# Patient Record
Sex: Female | Born: 1970 | Race: Black or African American | Hispanic: No | Marital: Single | Smoking: Never smoker
Health system: Southern US, Community
[De-identification: ages and names within clinical notes are randomized; demographics above are authoritative.]

## PROBLEM LIST (undated history)

## (undated) DIAGNOSIS — R062 Wheezing: Secondary | ICD-10-CM

## (undated) DIAGNOSIS — E119 Type 2 diabetes mellitus without complications: Secondary | ICD-10-CM

## (undated) DIAGNOSIS — I499 Cardiac arrhythmia, unspecified: Secondary | ICD-10-CM

## (undated) DIAGNOSIS — I10 Essential (primary) hypertension: Secondary | ICD-10-CM

## (undated) DIAGNOSIS — R609 Edema, unspecified: Secondary | ICD-10-CM

## (undated) DIAGNOSIS — I272 Pulmonary hypertension, unspecified: Secondary | ICD-10-CM

## (undated) DIAGNOSIS — Z9109 Other allergy status, other than to drugs and biological substances: Secondary | ICD-10-CM

## (undated) DIAGNOSIS — G4730 Sleep apnea, unspecified: Secondary | ICD-10-CM

## (undated) DIAGNOSIS — Q909 Down syndrome, unspecified: Secondary | ICD-10-CM

## (undated) DIAGNOSIS — J189 Pneumonia, unspecified organism: Secondary | ICD-10-CM

## (undated) DIAGNOSIS — N049 Nephrotic syndrome with unspecified morphologic changes: Secondary | ICD-10-CM

## (undated) DIAGNOSIS — J45909 Unspecified asthma, uncomplicated: Secondary | ICD-10-CM

## (undated) DIAGNOSIS — N289 Disorder of kidney and ureter, unspecified: Secondary | ICD-10-CM

## (undated) HISTORY — PX: INGUINAL HERNIA REPAIR: SUR1180

## (undated) HISTORY — DX: Other allergy status, other than to drugs and biological substances: Z91.09

## (undated) HISTORY — PX: EYE SURGERY: SHX253

## (undated) HISTORY — DX: Nephrotic syndrome with unspecified morphologic changes: N04.9

## (undated) HISTORY — DX: Down syndrome, unspecified: Q90.9

## (undated) HISTORY — DX: Unspecified asthma, uncomplicated: J45.909

## (undated) HISTORY — DX: Sleep apnea, unspecified: G47.30

## (undated) HISTORY — PX: TONSILLECTOMY: SUR1361

## (undated) SURGERY — DIALYSIS/PERMA CATHETER REPAIR

---

## 2008-08-27 ENCOUNTER — Ambulatory Visit: Payer: Self-pay | Admitting: Urology

## 2011-11-02 ENCOUNTER — Ambulatory Visit: Payer: Self-pay | Admitting: Internal Medicine

## 2011-11-03 ENCOUNTER — Ambulatory Visit: Payer: Self-pay | Admitting: Internal Medicine

## 2012-03-10 ENCOUNTER — Emergency Department: Payer: Self-pay | Admitting: Emergency Medicine

## 2012-03-10 LAB — CBC
MCH: 28.4 pg (ref 26.0–34.0)
MCHC: 32.2 g/dL (ref 32.0–36.0)
RBC: 3.93 10*6/uL (ref 3.80–5.20)
RDW: 14.6 % — ABNORMAL HIGH (ref 11.5–14.5)

## 2012-03-10 LAB — COMPREHENSIVE METABOLIC PANEL
Albumin: 3.1 g/dL — ABNORMAL LOW (ref 3.4–5.0)
Alkaline Phosphatase: 74 U/L (ref 50–136)
Anion Gap: 8 (ref 7–16)
BUN: 25 mg/dL — ABNORMAL HIGH (ref 7–18)
Bilirubin,Total: 0.4 mg/dL (ref 0.2–1.0)
Calcium, Total: 9 mg/dL (ref 8.5–10.1)
Chloride: 103 mmol/L (ref 98–107)
EGFR (Non-African Amer.): 34 — ABNORMAL LOW
SGOT(AST): 13 U/L — ABNORMAL LOW (ref 15–37)
Sodium: 139 mmol/L (ref 136–145)
Total Protein: 7.9 g/dL (ref 6.4–8.2)

## 2012-05-31 ENCOUNTER — Ambulatory Visit: Payer: Self-pay | Admitting: Internal Medicine

## 2012-10-27 ENCOUNTER — Ambulatory Visit: Payer: Self-pay | Admitting: Physician Assistant

## 2015-07-23 ENCOUNTER — Ambulatory Visit
Admission: RE | Admit: 2015-07-23 | Discharge: 2015-07-23 | Disposition: A | Discharge status: Home / Self Care | Payer: Medicare Other | Source: Ambulatory Visit | Attending: Nurse Practitioner | Admitting: Nurse Practitioner

## 2015-07-23 ENCOUNTER — Other Ambulatory Visit: Payer: Self-pay | Admitting: Nurse Practitioner

## 2015-07-23 DIAGNOSIS — M7989 Other specified soft tissue disorders: Secondary | ICD-10-CM | POA: Insufficient documentation

## 2015-07-23 DIAGNOSIS — M79672 Pain in left foot: Secondary | ICD-10-CM | POA: Diagnosis present

## 2015-07-23 DIAGNOSIS — M79675 Pain in left toe(s): Secondary | ICD-10-CM | POA: Diagnosis present

## 2015-11-30 ENCOUNTER — Emergency Department: Payer: Medicare Other

## 2015-11-30 ENCOUNTER — Encounter: Payer: Self-pay | Admitting: Emergency Medicine

## 2015-11-30 ENCOUNTER — Inpatient Hospital Stay
Admission: EM | Admit: 2015-11-30 | Discharge: 2015-12-03 | DRG: 193 | Disposition: A | Discharge status: Home / Self Care | Payer: Medicare Other | Attending: Internal Medicine | Admitting: Internal Medicine

## 2015-11-30 DIAGNOSIS — J189 Pneumonia, unspecified organism: Secondary | ICD-10-CM | POA: Diagnosis not present

## 2015-11-30 DIAGNOSIS — J069 Acute upper respiratory infection, unspecified: Secondary | ICD-10-CM | POA: Diagnosis present

## 2015-11-30 DIAGNOSIS — Z8249 Family history of ischemic heart disease and other diseases of the circulatory system: Secondary | ICD-10-CM | POA: Diagnosis not present

## 2015-11-30 DIAGNOSIS — Z794 Long term (current) use of insulin: Secondary | ICD-10-CM | POA: Diagnosis not present

## 2015-11-30 DIAGNOSIS — J9602 Acute respiratory failure with hypercapnia: Secondary | ICD-10-CM

## 2015-11-30 DIAGNOSIS — Z79899 Other long term (current) drug therapy: Secondary | ICD-10-CM

## 2015-11-30 DIAGNOSIS — J9601 Acute respiratory failure with hypoxia: Secondary | ICD-10-CM | POA: Diagnosis present

## 2015-11-30 DIAGNOSIS — Z833 Family history of diabetes mellitus: Secondary | ICD-10-CM | POA: Diagnosis not present

## 2015-11-30 DIAGNOSIS — Q909 Down syndrome, unspecified: Secondary | ICD-10-CM | POA: Diagnosis not present

## 2015-11-30 DIAGNOSIS — W19XXXA Unspecified fall, initial encounter: Secondary | ICD-10-CM

## 2015-11-30 DIAGNOSIS — Z6841 Body Mass Index (BMI) 40.0 and over, adult: Secondary | ICD-10-CM

## 2015-11-30 DIAGNOSIS — E119 Type 2 diabetes mellitus without complications: Secondary | ICD-10-CM

## 2015-11-30 DIAGNOSIS — I129 Hypertensive chronic kidney disease with stage 1 through stage 4 chronic kidney disease, or unspecified chronic kidney disease: Secondary | ICD-10-CM | POA: Diagnosis present

## 2015-11-30 DIAGNOSIS — N183 Chronic kidney disease, stage 3 (moderate): Secondary | ICD-10-CM | POA: Diagnosis present

## 2015-11-30 DIAGNOSIS — E1122 Type 2 diabetes mellitus with diabetic chronic kidney disease: Secondary | ICD-10-CM | POA: Diagnosis present

## 2015-11-30 DIAGNOSIS — N179 Acute kidney failure, unspecified: Secondary | ICD-10-CM | POA: Diagnosis present

## 2015-11-30 DIAGNOSIS — W010XXA Fall on same level from slipping, tripping and stumbling without subsequent striking against object, initial encounter: Secondary | ICD-10-CM | POA: Diagnosis present

## 2015-11-30 DIAGNOSIS — E669 Obesity, unspecified: Secondary | ICD-10-CM | POA: Diagnosis present

## 2015-11-30 DIAGNOSIS — R55 Syncope and collapse: Secondary | ICD-10-CM

## 2015-11-30 HISTORY — DX: Essential (primary) hypertension: I10

## 2015-11-30 HISTORY — DX: Type 2 diabetes mellitus without complications: E11.9

## 2015-11-30 HISTORY — DX: Disorder of kidney and ureter, unspecified: N28.9

## 2015-11-30 LAB — CBC WITH DIFFERENTIAL/PLATELET
BASOS ABS: 0.1 10*3/uL (ref 0–0.1)
Basophils Relative: 1 %
EOS ABS: 0 10*3/uL (ref 0–0.7)
EOS PCT: 0 %
HCT: 39.6 % (ref 35.0–47.0)
Hemoglobin: 12.6 g/dL (ref 12.0–16.0)
Lymphocytes Relative: 5 %
Lymphs Abs: 0.7 10*3/uL — ABNORMAL LOW (ref 1.0–3.6)
MCH: 28.8 pg (ref 26.0–34.0)
MCHC: 32 g/dL (ref 32.0–36.0)
MCV: 90.1 fL (ref 80.0–100.0)
Monocytes Absolute: 1.5 10*3/uL — ABNORMAL HIGH (ref 0.2–0.9)
Monocytes Relative: 11 %
Neutro Abs: 11.1 10*3/uL — ABNORMAL HIGH (ref 1.4–6.5)
Neutrophils Relative %: 83 %
PLATELETS: 282 10*3/uL (ref 150–440)
RBC: 4.39 MIL/uL (ref 3.80–5.20)
RDW: 15.9 % — AB (ref 11.5–14.5)
WBC: 13.4 10*3/uL — ABNORMAL HIGH (ref 3.6–11.0)

## 2015-11-30 LAB — COMPREHENSIVE METABOLIC PANEL
ALK PHOS: 63 U/L (ref 38–126)
ALT: 14 U/L (ref 14–54)
AST: 16 U/L (ref 15–41)
Albumin: 2.9 g/dL — ABNORMAL LOW (ref 3.5–5.0)
Anion gap: 5 (ref 5–15)
BILIRUBIN TOTAL: 0.8 mg/dL (ref 0.3–1.2)
BUN: 35 mg/dL — AB (ref 6–20)
CALCIUM: 9.5 mg/dL (ref 8.9–10.3)
CO2: 32 mmol/L (ref 22–32)
CREATININE: 2.72 mg/dL — AB (ref 0.44–1.00)
Chloride: 101 mmol/L (ref 101–111)
GFR calc Af Amer: 23 mL/min — ABNORMAL LOW (ref >60)
GFR, EST NON AFRICAN AMERICAN: 20 mL/min — AB (ref >60)
GLUCOSE: 180 mg/dL — AB (ref 65–99)
POTASSIUM: 4.7 mmol/L (ref 3.5–5.1)
Sodium: 138 mmol/L (ref 135–145)
TOTAL PROTEIN: 7 g/dL (ref 6.5–8.1)

## 2015-11-30 LAB — CBC
HCT: 38.8 % (ref 35.0–47.0)
Hemoglobin: 12.4 g/dL (ref 12.0–16.0)
MCH: 28.8 pg (ref 26.0–34.0)
MCHC: 32 g/dL (ref 32.0–36.0)
MCV: 90 fL (ref 80.0–100.0)
PLATELETS: 270 10*3/uL (ref 150–440)
RBC: 4.31 MIL/uL (ref 3.80–5.20)
RDW: 15.8 % — AB (ref 11.5–14.5)
WBC: 10.3 10*3/uL (ref 3.6–11.0)

## 2015-11-30 LAB — RAPID INFLUENZA A&B ANTIGENS (ARMC ONLY): INFLUENZA A (ARMC): NEGATIVE

## 2015-11-30 LAB — CREATININE, SERUM
Creatinine, Ser: 2.51 mg/dL — ABNORMAL HIGH (ref 0.44–1.00)
GFR calc Af Amer: 26 mL/min — ABNORMAL LOW (ref >60)
GFR calc non Af Amer: 22 mL/min — ABNORMAL LOW (ref >60)

## 2015-11-30 LAB — RAPID INFLUENZA A&B ANTIGENS: Influenza B (ARMC): NEGATIVE

## 2015-11-30 LAB — INFLUENZA PANEL BY PCR (TYPE A & B)
H1N1FLUPCR: NOT DETECTED
INFLAPCR: NEGATIVE
INFLBPCR: NEGATIVE

## 2015-11-30 LAB — GLUCOSE, CAPILLARY
GLUCOSE-CAPILLARY: 159 mg/dL — AB (ref 65–99)
Glucose-Capillary: 229 mg/dL — ABNORMAL HIGH (ref 65–99)
Glucose-Capillary: 244 mg/dL — ABNORMAL HIGH (ref 65–99)

## 2015-11-30 MED ORDER — ONDANSETRON HCL 4 MG/2ML IJ SOLN: 4.0000 mg | Freq: Four times a day (QID) | INTRAMUSCULAR | Status: DC | PRN

## 2015-11-30 MED ORDER — MOMETASONE FURO-FORMOTEROL FUM 100-5 MCG/ACT IN AERO
2.0000 | INHALATION_SPRAY | Freq: Two times a day (BID) | RESPIRATORY_TRACT | Status: DC
  Administered 2015-11-30 – 2015-12-03 (×5): 2 via RESPIRATORY_TRACT
  Filled 2015-11-30 (×2): qty 8.8

## 2015-11-30 MED ORDER — ACETAMINOPHEN 325 MG PO TABS
650.0000 mg | ORAL_TABLET | Freq: Four times a day (QID) | ORAL | Status: DC | PRN
  Administered 2015-11-30: 650 mg via ORAL
  Filled 2015-11-30: qty 2

## 2015-11-30 MED ORDER — ONDANSETRON HCL 4 MG PO TABS: 4.0000 mg | ORAL_TABLET | Freq: Four times a day (QID) | ORAL | Status: DC | PRN

## 2015-11-30 MED ORDER — ALBUTEROL SULFATE HFA 108 (90 BASE) MCG/ACT IN AERS: 2.0000 | INHALATION_SPRAY | Freq: Four times a day (QID) | RESPIRATORY_TRACT | Status: DC | PRN

## 2015-11-30 MED ORDER — HEPARIN SODIUM (PORCINE) 5000 UNIT/ML IJ SOLN
5000.0000 [IU] | Freq: Three times a day (TID) | INTRAMUSCULAR | Status: DC
  Administered 2015-11-30 – 2015-12-01 (×2): 5000 [IU] via SUBCUTANEOUS
  Filled 2015-11-30 (×2): qty 1

## 2015-11-30 MED ORDER — SODIUM CHLORIDE 0.9 % IV SOLN
INTRAVENOUS | Status: DC
  Administered 2015-11-30 – 2015-12-02 (×4): via INTRAVENOUS

## 2015-11-30 MED ORDER — IPRATROPIUM-ALBUTEROL 0.5-2.5 (3) MG/3ML IN SOLN
3.0000 mL | Freq: Four times a day (QID) | RESPIRATORY_TRACT | Status: DC
  Administered 2015-11-30 (×2): 3 mL via RESPIRATORY_TRACT
  Filled 2015-11-30 (×3): qty 3

## 2015-11-30 MED ORDER — INSULIN ASPART 100 UNIT/ML ~~LOC~~ SOLN
0.0000 [IU] | Freq: Three times a day (TID) | SUBCUTANEOUS | Status: DC
  Administered 2015-11-30: 2 [IU] via SUBCUTANEOUS
  Administered 2015-12-01: 3 [IU] via SUBCUTANEOUS
  Administered 2015-12-01: 2 [IU] via SUBCUTANEOUS
  Filled 2015-11-30: qty 2
  Filled 2015-11-30: qty 3

## 2015-11-30 MED ORDER — GUAIFENESIN ER 600 MG PO TB12
600.0000 mg | ORAL_TABLET | Freq: Two times a day (BID) | ORAL | Status: DC
  Administered 2015-11-30 – 2015-12-03 (×6): 600 mg via ORAL
  Filled 2015-11-30 (×8): qty 1

## 2015-11-30 MED ORDER — DOCUSATE SODIUM 100 MG PO CAPS
100.0000 mg | ORAL_CAPSULE | Freq: Two times a day (BID) | ORAL | Status: DC
  Administered 2015-11-30 – 2015-12-03 (×6): 100 mg via ORAL
  Filled 2015-11-30 (×6): qty 1

## 2015-11-30 MED ORDER — BISACODYL 10 MG RE SUPP: 10.0000 mg | Freq: Every day | RECTAL | Status: DC | PRN

## 2015-11-30 MED ORDER — SODIUM CHLORIDE 0.9 % IV BOLUS (SEPSIS)
1000.0000 mL | Freq: Once | INTRAVENOUS | Status: AC
  Administered 2015-11-30: 1000 mL via INTRAVENOUS

## 2015-11-30 MED ORDER — ALBUTEROL SULFATE (2.5 MG/3ML) 0.083% IN NEBU: 2.5000 mg | INHALATION_SOLUTION | Freq: Four times a day (QID) | RESPIRATORY_TRACT | Status: DC | PRN

## 2015-11-30 MED ORDER — FLUTICASONE PROPIONATE 50 MCG/ACT NA SUSP
1.0000 | Freq: Two times a day (BID) | NASAL | Status: DC | PRN
  Filled 2015-11-30: qty 16

## 2015-11-30 MED ORDER — METOPROLOL TARTRATE 25 MG PO TABS
25.0000 mg | ORAL_TABLET | Freq: Two times a day (BID) | ORAL | Status: DC
  Administered 2015-11-30 – 2015-12-03 (×6): 25 mg via ORAL
  Filled 2015-11-30 (×6): qty 1

## 2015-11-30 MED ORDER — LEVOFLOXACIN IN D5W 500 MG/100ML IV SOLN
500.0000 mg | INTRAVENOUS | Status: DC
  Filled 2015-11-30: qty 100

## 2015-11-30 MED ORDER — INSULIN ASPART 100 UNIT/ML ~~LOC~~ SOLN
4.0000 [IU] | Freq: Once | SUBCUTANEOUS | Status: AC
  Administered 2015-11-30: 4 [IU] via SUBCUTANEOUS
  Filled 2015-11-30: qty 4

## 2015-11-30 MED ORDER — LEVOCETIRIZINE DIHYDROCHLORIDE 5 MG PO TABS: 5.0000 mg | ORAL_TABLET | Freq: Every day | ORAL | Status: DC

## 2015-11-30 MED ORDER — METHYLPREDNISOLONE SODIUM SUCC 125 MG IJ SOLR
60.0000 mg | Freq: Four times a day (QID) | INTRAMUSCULAR | Status: DC
  Administered 2015-11-30 – 2015-12-01 (×4): 60 mg via INTRAVENOUS
  Filled 2015-11-30 (×4): qty 2

## 2015-11-30 MED ORDER — LORATADINE 10 MG PO TABS
10.0000 mg | ORAL_TABLET | Freq: Every day | ORAL | Status: DC
  Administered 2015-12-01 – 2015-12-03 (×3): 10 mg via ORAL
  Filled 2015-11-30 (×3): qty 1

## 2015-11-30 MED ORDER — LIRAGLUTIDE 18 MG/3ML ~~LOC~~ SOPN
1.2000 mg | PEN_INJECTOR | Freq: Every day | SUBCUTANEOUS | Status: DC
  Filled 2015-11-30: qty 3

## 2015-11-30 MED ORDER — ASPIRIN EC 81 MG PO TBEC
81.0000 mg | DELAYED_RELEASE_TABLET | Freq: Every day | ORAL | Status: DC
  Administered 2015-12-01 – 2015-12-03 (×3): 81 mg via ORAL
  Filled 2015-11-30 (×3): qty 1

## 2015-11-30 MED ORDER — PANTOPRAZOLE SODIUM 40 MG PO TBEC
40.0000 mg | DELAYED_RELEASE_TABLET | Freq: Every day | ORAL | Status: DC
  Administered 2015-12-01 – 2015-12-03 (×3): 40 mg via ORAL
  Filled 2015-11-30 (×3): qty 1

## 2015-11-30 MED ORDER — LEVOFLOXACIN IN D5W 750 MG/150ML IV SOLN
750.0000 mg | Freq: Once | INTRAVENOUS | Status: AC
  Administered 2015-11-30: 750 mg via INTRAVENOUS
  Filled 2015-11-30: qty 150

## 2015-11-30 MED ORDER — MORPHINE SULFATE (PF) 2 MG/ML IV SOLN: 2.0000 mg | INTRAVENOUS | Status: DC | PRN

## 2015-11-30 MED ORDER — MUCINEX DM MAXIMUM STRENGTH 60-1200 MG PO TB12
1.0000 | ORAL_TABLET | Freq: Two times a day (BID) | ORAL | Status: DC
Start: 2015-11-30 — End: 2015-11-30

## 2015-11-30 MED ORDER — BENAZEPRIL HCL 20 MG PO TABS
40.0000 mg | ORAL_TABLET | Freq: Every day | ORAL | Status: DC
  Administered 2015-12-01 – 2015-12-02 (×2): 40 mg via ORAL
  Filled 2015-11-30 (×2): qty 2

## 2015-11-30 MED ORDER — DOXYCYCLINE HYCLATE 100 MG PO TABS
100.0000 mg | ORAL_TABLET | Freq: Two times a day (BID) | ORAL | Status: DC
  Administered 2015-11-30 – 2015-12-01 (×3): 100 mg via ORAL
  Filled 2015-11-30 (×3): qty 1

## 2015-11-30 MED ORDER — ACETAMINOPHEN 650 MG RE SUPP: 650.0000 mg | Freq: Four times a day (QID) | RECTAL | Status: DC | PRN

## 2015-11-30 NOTE — ED Notes (Signed)
Pt arrived by EMS after falling for the second time this morning. Pt fell the first time this morning at 4am, upon EMS arrival pt refused to go to the hospital. Pt was getting ready to come in to ED with family at [facility] and fell again. EMS was called and per EMS pt/family state she has just finished a Azithromycin for upper respiratory infection and since taking has had c/o of tremors and general malaise with fever.

## 2015-11-30 NOTE — Progress Notes (Signed)
Called spoke w/ [facility]- Dr. Anselm Jungling r/t pt.'s CBG : 244, MD ordered 4 units of regular insulin x 1 SQ. Will continue to monitor pt. Closely.

## 2015-11-30 NOTE — ED Provider Notes (Signed)
[facility] Emergency Department Provider Note  ____________________________________________  Time seen: Approximately 8:39 AM  I have reviewed the triage vital signs and the nursing notes.   HISTORY  Chief Complaint Fall    HPI Michele Bartlett is a 45 y.o. female with history of Down syndrome, hypertension, diabetes, chronic kidney disease who presents for evaluation of falls today and generalized weakness, gradual onset, intermittent, no modifying factors. The patient's mother who is at bedside is providing most of the history. Mother reports that the patient tripped and fell earlier today. EMS was called, they got her up and she walked back to bed. Later on this morning, mother tried to get her out of bed and she was weak and her knees buckled, she fell onto the right hip/knee. She did not hit her head or lose consciousness. Mother reports that she has been ill with cough and congestion. She was started on azithromycin 2-3 days ago for this. She has had had low-grade fevers. No vomiting, diarrhea. No dysuria.   Past Medical History  Diagnosis Date  . Hypertension   . Diabetes mellitus without complication ([place])   . Kidney disease     Patient Active Problem List   Diagnosis Date Noted  . Acute respiratory failure ([place]) 11/30/2015  . CAP (community acquired pneumonia) 11/30/2015  . Syncope and collapse 11/30/2015  . Diabetes mellitus without complication ([place]) [id number]    History reviewed. No pertinent past surgical history.  Current Outpatient Rx  Name  Route  Sig  Dispense  Refill  . albuterol (PROAIR HFA) 108 (90 Base) MCG/ACT inhaler   Inhalation   Inhale 2 puffs into the lungs every 6 (six) hours as needed. For  Wheezing.         Marland Kitchen albuterol (PROVENTIL) (2.5 MG/3ML) 0.083% nebulizer solution   Nebulization   Take 2.5 mg by nebulization every 6 (six) hours as needed for wheezing or shortness of breath.         Marland Kitchen azithromycin (ZITHROMAX) 250  MG tablet   Oral   Take 250-500 mg by mouth See admin instructions. Take 2 tablets (500mg ) by mouth now, then take 1 tablet by mouth once a day x 4 days.         . benazepril (LOTENSIN) 40 MG tablet   Oral   Take 40 mg by mouth daily.         Marland Kitchen Dextromethorphan-Guaifenesin (MUCINEX DM MAXIMUM STRENGTH) 60-1200 MG TB12   Oral   Take 1 tablet by mouth every 12 (twelve) hours.         . fluticasone (FLONASE) 50 MCG/ACT nasal spray   Each Nare   Place 1 spray into both nostrils 2 (two) times daily as needed for allergies or rhinitis. Reported on 11/30/2015         . furosemide (LASIX) 40 MG tablet   Oral   Take 40 mg by mouth every other day.         . levocetirizine (XYZAL) 5 MG tablet   Oral   Take 5 mg by mouth daily.         . metoprolol tartrate (LOPRESSOR) 25 MG tablet   Oral   Take 25 mg by mouth 2 (two) times daily.         . mometasone-formoterol (DULERA) 100-5 MCG/ACT AERO   Inhalation   Inhale 2 puffs into the lungs every 12 (twelve) hours as needed for wheezing or shortness of breath.         Marland Kitchen  OXYGEN   Inhalation   Inhale 2 L/min into the lungs at bedtime.         Marland Kitchen VICTOZA 18 MG/3ML SOPN   Subcutaneous   Inject 1.2 mg into the skin daily.           Dispense as written.     Allergies Novocain  Family History  Problem Relation Age of Onset  . Diabetes Mellitus II Mother   . CAD Father   . Hypertension Father     Social History Social History  Substance Use Topics  . Smoking status: Never Smoker   . Smokeless tobacco: None  . Alcohol Use: No    Review of Systems Constitutional: + fever/chills Eyes: No visual changes. ENT: No sore throat. Cardiovascular: Denies chest pain. Respiratory: Denies shortness of breath. Gastrointestinal: No abdominal pain.  No nausea, no vomiting.  No diarrhea.  No constipation. Genitourinary: Negative for dysuria. Musculoskeletal: Negative for back pain. Skin: Negative for rash. Neurological:  Negative for headaches, focal weakness or numbness.  10-point ROS otherwise negative.  ____________________________________________   PHYSICAL EXAM:  VITAL SIGNS: ED Triage Vitals  Enc Vitals Group     BP 11/30/15 0824 132/87 mmHg     Pulse --      Resp --      Temp 11/30/15 0832 99.4 F (37.4 C)     Temp Source 11/30/15 0832 Axillary     SpO2 11/30/15 0818 93 %     Weight 11/30/15 0824 137 lb 9.6 oz (62.415 kg)     Height 11/30/15 0824 5\' 2"  (1.575 m)     Head Cir --      Peak Flow --      Pain Score --      Pain Loc --      Pain Edu? --      Excl. in [place]? --     Constitutional: Alert and oriented. Nontoxic-appearing and in no acute distress. Eyes: Conjunctivae are normal. PERRL. EOMI. Head: Atraumatic. Nose: No congestion/rhinnorhea. Mouth/Throat: Mucous membranes are moist.  Oropharynx non-erythematous. Neck: No stridor. Supple without meningismus. No midline C-spine tenderness to palpation. Cardiovascular: Normal rate, regular rhythm. Grossly normal heart sounds.  Good peripheral circulation. Respiratory: Normal respiratory effort.  No retractions. Slightly diminished breath sounds in the right lung fields. Gastrointestinal: Soft and nontender. No distention. No CVA tenderness. Genitourinary: deferred Musculoskeletal: Mild tenderness throughout the right knee and thigh but full range of motion at the right hip and knee. No midline T or L-spine tenderness to palpation. Neurologic:  Normal speech and language. No gross focal neurologic deficits are appreciated.  Skin:  Skin is warm, dry and intact. No rash noted. Psychiatric: Mood and affect are normal. Speech and behavior are normal.  ____________________________________________   LABS (all labs ordered are listed, but only abnormal results are displayed)  Labs Reviewed  CBC WITH DIFFERENTIAL/PLATELET - Abnormal; Notable for the following:    WBC 13.4 (*)    RDW 15.9 (*)    Neutro Abs 11.1 (*)    Lymphs Abs 0.7  (*)    Monocytes Absolute 1.5 (*)    All other components within normal limits  COMPREHENSIVE METABOLIC PANEL - Abnormal; Notable for the following:    Glucose, Bld 180 (*)    BUN 35 (*)    Creatinine, Ser 2.72 (*)    Albumin 2.9 (*)    GFR calc non Af Amer 20 (*)    GFR calc Af Amer 23 (*)    All  other components within normal limits  RAPID INFLUENZA A&B ANTIGENS ([facility] ONLY)  CULTURE, BLOOD (ROUTINE X 2)  CULTURE, BLOOD (ROUTINE X 2)  URINALYSIS COMPLETEWITH MICROSCOPIC ([facility] ONLY)   ____________________________________________  EKG  none ____________________________________________  RADIOLOGY  CXR IMPRESSION: 1. Worsening right middle lobe and left infrahilar consolidation/atelectasis. 2. Stable mild cardiomegaly.   Right hip xray IMPRESSION: Negative.   Right knee xray IMPRESSION: 1. Negative for acute abnormality.     ____________________________________________   PROCEDURES  Procedure(s) performed: None  Critical Care performed: No  ____________________________________________   INITIAL IMPRESSION / ASSESSMENT AND PLAN / ED COURSE  Pertinent labs & imaging results that were available during my care of the patient were reviewed by me and considered in my medical decision making (see chart for details).  Michele Bartlett is a 45 y.o. female with history of Down syndrome, hypertension, diabetes, chronic kidney disease who presents for evaluation of falls today and generalized weakness. On exam, she is nontoxic appearing. Vital signs are stable. O2 saturation is 97% on 3 L supplemental oxygen however the patient does wear oxygen at night. Labs notable for mild leukocytosis. Creatinine is elevated at 2.72, baseline appears to be closer to 1.8, we'll give IV fluids. Chest x-ray shows pneumonia and multiple lobes and I suspect she has failed outpatient treatment with azithromycin. Levaquin ordered. I discussed the case with the hospitalist, Dr. Doy Hutching, for  admission at 75 AM. ____________________________________________   FINAL CLINICAL IMPRESSION(S) / ED DIAGNOSES  Final diagnoses:  Community acquired pneumonia  AKI (acute kidney injury) ([place])  Fall, initial encounter      Joanne Gavel, MD 11/30/15 1120

## 2015-11-30 NOTE — ED Notes (Signed)
Pt's family states they found her in the floor this morning around 4 am. Family states no LOC and that at around 6 am the pt became weak again and they lowered her to the floor.

## 2015-11-30 NOTE — H&P (Signed)
History and Physical    Michele Bartlett [id number] DOB: 1971-02-17 DOA: 11/30/2015  Referring physician: Dr. Edd Fabian PCP: Lavera Guise, MD  Specialists: none  Chief Complaint: syncope/weakness  HPI: Michele Bartlett is a 45 y.o. female has a past medical history significant for HTN, DM, and obesity recently treated for URI by PCP with Z-pak. Now with 2 episodes of severe weakness with syncope. Was brought to the ER where she was found to be hypoxic with pneumonia on CXR. She is now admitted. Has had fever with cough productive of brown sputum. No N/V/D. Denies CP. Has been extremely SOB and weak with DOE. Sugars at home have been stable.  Review of Systems: The patient denies anorexia, weight loss,, vision loss, decreased hearing, hoarseness, chest pain,  peripheral edema, balance deficits, hemoptysis, abdominal pain, melena, hematochezia, severe indigestion/heartburn, hematuria, incontinence, genital sores, muscle weakness, suspicious skin lesions, transient blindness, difficulty walking, depression, unusual weight change, abnormal bleeding, enlarged lymph nodes, angioedema, and breast masses.   Past Medical History  Diagnosis Date  . Hypertension   . Diabetes mellitus without complication ([place])   . Kidney disease    History reviewed. No pertinent past surgical history. Social History:  reports that she has never smoked. She does not have any smokeless tobacco history on file. She reports that she does not drink alcohol. Her drug history is not on file.  Allergies  Allergen Reactions  . Novocain [Procaine] Rash    Other reaction(s): Other (See Comments) Skin peel raw all over    Family History  Problem Relation Age of Onset  . Diabetes Mellitus II Mother   . CAD Father   . Hypertension Father     Prior to Admission medications   Medication Sig Start Date End Date Taking? Authorizing Provider  albuterol (PROAIR HFA) 108 (90 Base) MCG/ACT inhaler Inhale 2 puffs into the lungs  every 6 (six) hours as needed. For  Wheezing.   Yes Historical Provider, MD  azithromycin (ZITHROMAX) 250 MG tablet Take 250-500 mg by mouth See admin instructions. Take 2 tablets (500mg ) by mouth now, then take 1 tablet by mouth once a day x 4 days. 11/28/15  Yes Historical Provider, MD  Dextromethorphan-Guaifenesin (MUCINEX DM MAXIMUM STRENGTH) 60-1200 MG TB12 Take 1 tablet by mouth every 12 (twelve) hours.   Yes Historical Provider, MD  furosemide (LASIX) 40 MG tablet Take 40 mg by mouth every other day. 10/07/15  Yes Historical Provider, MD  levocetirizine (XYZAL) 5 MG tablet Take 5 mg by mouth daily. 09/03/15  Yes Historical Provider, MD  metoprolol tartrate (LOPRESSOR) 25 MG tablet Take 25 mg by mouth 2 (two) times daily. 10/07/15  Yes Historical Provider, MD  OXYGEN Inhale 2 L/min into the lungs at bedtime.   Yes Historical Provider, MD  VICTOZA 18 MG/3ML SOPN Inject 1.2 mg into the skin daily. 11/05/15  Yes Historical Provider, MD   Physical Exam: Filed Vitals:   11/30/15 0818 11/30/15 0824 11/30/15 0832 11/30/15 0930  BP:  132/87  107/88  Pulse:    76  Temp:   99.4 F (37.4 C)   TempSrc:   Axillary   Height:  5\' 2"  (1.575 m)    Weight:  62.415 kg (137 lb 9.6 oz)    SpO2: 93% 96%  97%     General:  Acutely ill apperaing in moderate distress, WDWN, [place]/AT  Eyes: PERRL, EOMI, no scleral icterus, conjunctiva clear  ENT: dry oropharynx with erythema but no exudate, TM's benign, dentition poor  Neck: supple, no lymphadenopathy. No thyromegaly or bruits  Cardiovascular: regular rate without MRG; 2+ peripheral pulses, no JVD, 1+ peripheral edema  Respiratory: Respiratory effort increased with diffuse wheezing and right-sided rhonchi. No rales  Abdomen: soft, non tender to palpation, positive bowel sounds, no guarding, no rebound, no organomegaly  Skin: no rashes or lesions  Musculoskeletal: normal bulk and tone, no joint swelling  Psychiatric: normal mood and affect,  A&OX3  Neurologic: CN 2-12 grossly intact, Motor strength 5/5 in all 4 groups with symmetric DTR's and non-focal sensory exam  Labs on Admission:  Basic Metabolic Panel:  Recent Labs Lab 11/30/15 0909  NA 138  K 4.7  CL 101  CO2 32  GLUCOSE 180*  BUN 35*  CREATININE 2.72*  CALCIUM 9.5   Liver Function Tests:  Recent Labs Lab 11/30/15 0909  AST 16  ALT 14  ALKPHOS 63  BILITOT 0.8  PROT 7.0  ALBUMIN 2.9*   No results for input(s): LIPASE, AMYLASE in the last 168 hours. No results for input(s): AMMONIA in the last 168 hours. CBC:  Recent Labs Lab 11/30/15 0909  WBC 13.4*  NEUTROABS 11.1*  HGB 12.6  HCT 39.6  MCV 90.1  PLT 282   Cardiac Enzymes: No results for input(s): CKTOTAL, CKMB, CKMBINDEX, TROPONINI in the last 168 hours.  BNP (last 3 results) No results for input(s): BNP in the last 8760 hours.  ProBNP (last 3 results) No results for input(s): PROBNP in the last 8760 hours.  CBG: No results for input(s): GLUCAP in the last 168 hours.  Radiological Exams on Admission: Dg Chest 2 View  11/30/2015  CLINICAL DATA:  state she has just finished a Azithromycin for upper respiratory infection and since taking has had c/o of tremors and general malaise with fever. Pt does not stand. EXAM: CHEST - 2 VIEW COMPARISON:  03/10/2012 FINDINGS: Mild cardiomegaly. New right middle lobe consolidation. Progressive left infrahilar consolidation/atelectasis. No effusion.  No pneumothorax. Visualized skeletal structures are unremarkable. IMPRESSION: 1. Worsening right middle lobe and left infrahilar consolidation/atelectasis. 2. Stable mild cardiomegaly. Electronically Signed   By: Lucrezia Europe M.D.   On: 11/30/2015 09:13   Dg Knee 2 Views Right  11/30/2015  CLINICAL DATA:  Multiple recent falls; pain in right hip and right knee now; pt naturally in very externally rotated position; positioning sponge used for AP positions; best possible images obtained EXAM: RIGHT KNEE - 1-2  VIEW COMPARISON:  None. FINDINGS: There is no evidence of fracture, dislocation, or joint effusion. Small marginal spur from the patellar articular surface. Traction spur at the insertion of the quadriceps tendon. Benign appearing sclerotic lesion in the proximal tibial metaphysis. There is no evidence of arthropathy or other focal bone abnormality. Soft tissues are unremarkable. IMPRESSION: 1. Negative for acute abnormality. Electronically Signed   By: Lucrezia Europe M.D.   On: 11/30/2015 10:20   Dg Hip Unilat With Pelvis 2-3 Views Right  11/30/2015  CLINICAL DATA:  Multiple recent falls; pain in right hip and right knee now; pt naturally in very externally rotated position; positioning sponge used for AP positions; best possible images obtained EXAM: DG HIP (WITH OR WITHOUT PELVIS) 2-3V RIGHT COMPARISON:  CT 08/27/2008 FINDINGS: There is no evidence of hip fracture or dislocation. There is no evidence of arthropathy or other focal bone abnormality. IMPRESSION: Negative. Electronically Signed   By: Lucrezia Europe M.D.   On: 11/30/2015 10:21    EKG: Independently reviewed.  Assessment/Plan Principal Problem:   Acute respiratory  failure ([place]) Active Problems:   CAP (community acquired pneumonia)   Syncope and collapse   Diabetes mellitus without complication ([place])   Will admit to floor with O2, IV steroids, IV ABX, and SVN's. Follow sugars. Repeat labs and CXR in AM. Consult PT and CM.  Diet: low carb Fluids: NS@100  DVT Prophylaxis: SQ Heparin  Code Status: FULL  Family Communication: yes  Disposition Plan: home  Time spent: 50 min

## 2015-12-01 ENCOUNTER — Inpatient Hospital Stay: Payer: Medicare Other

## 2015-12-01 LAB — GLUCOSE, CAPILLARY
GLUCOSE-CAPILLARY: 225 mg/dL — AB (ref 65–99)
GLUCOSE-CAPILLARY: 195 mg/dL — AB (ref 65–99)
Glucose-Capillary: 198 mg/dL — ABNORMAL HIGH (ref 65–99)
Glucose-Capillary: 167 mg/dL — ABNORMAL HIGH (ref 65–99)

## 2015-12-01 LAB — COMPREHENSIVE METABOLIC PANEL
ALK PHOS: 61 U/L (ref 38–126)
ALT: 13 U/L — AB (ref 14–54)
AST: 15 U/L (ref 15–41)
Albumin: 2.9 g/dL — ABNORMAL LOW (ref 3.5–5.0)
Anion gap: 11 (ref 5–15)
BUN: 38 mg/dL — AB (ref 6–20)
CALCIUM: 9.1 mg/dL (ref 8.9–10.3)
CHLORIDE: 103 mmol/L (ref 101–111)
CO2: 24 mmol/L (ref 22–32)
CREATININE: 2.43 mg/dL — AB (ref 0.44–1.00)
GFR, EST AFRICAN AMERICAN: 27 mL/min — AB (ref >60)
GFR, EST NON AFRICAN AMERICAN: 23 mL/min — AB (ref >60)
Glucose, Bld: 200 mg/dL — ABNORMAL HIGH (ref 65–99)
Potassium: 5.1 mmol/L (ref 3.5–5.1)
Sodium: 138 mmol/L (ref 135–145)
Total Bilirubin: 0.8 mg/dL (ref 0.3–1.2)
Total Protein: 7.2 g/dL (ref 6.5–8.1)

## 2015-12-01 LAB — CBC
HCT: 39.7 % (ref 35.0–47.0)
Hemoglobin: 12.5 g/dL (ref 12.0–16.0)
MCH: 28.1 pg (ref 26.0–34.0)
MCHC: 31.3 g/dL — ABNORMAL LOW (ref 32.0–36.0)
MCV: 89.7 fL (ref 80.0–100.0)
Platelets: 310 10*3/uL (ref 150–440)
RBC: 4.43 MIL/uL (ref 3.80–5.20)
RDW: 16.1 % — ABNORMAL HIGH (ref 11.5–14.5)
WBC: 11.3 10*3/uL — AB (ref 3.6–11.0)

## 2015-12-01 MED ORDER — INSULIN ASPART 100 UNIT/ML ~~LOC~~ SOLN
0.0000 [IU] | Freq: Three times a day (TID) | SUBCUTANEOUS | Status: DC
  Administered 2015-12-01 – 2015-12-02 (×3): 3 [IU] via SUBCUTANEOUS
  Administered 2015-12-02 – 2015-12-03 (×2): 2 [IU] via SUBCUTANEOUS
  Administered 2015-12-03: 3 [IU] via SUBCUTANEOUS
  Filled 2015-12-01 (×2): qty 3
  Filled 2015-12-01: qty 2
  Filled 2015-12-01: qty 3
  Filled 2015-12-01: qty 2
  Filled 2015-12-01: qty 3

## 2015-12-01 MED ORDER — ENOXAPARIN SODIUM 40 MG/0.4ML ~~LOC~~ SOLN
40.0000 mg | SUBCUTANEOUS | Status: DC
  Administered 2015-12-01: 40 mg via SUBCUTANEOUS
  Filled 2015-12-01: qty 0.4

## 2015-12-01 MED ORDER — LEVOFLOXACIN IN D5W 750 MG/150ML IV SOLN
750.0000 mg | INTRAVENOUS | Status: DC
  Administered 2015-12-02: 750 mg via INTRAVENOUS
  Filled 2015-12-01: qty 150

## 2015-12-01 MED ORDER — IPRATROPIUM-ALBUTEROL 0.5-2.5 (3) MG/3ML IN SOLN: 3.0000 mL | RESPIRATORY_TRACT | Status: DC | PRN

## 2015-12-01 MED ORDER — TRAMADOL HCL 50 MG PO TABS
50.0000 mg | ORAL_TABLET | Freq: Two times a day (BID) | ORAL | Status: DC | PRN
Start: 2015-12-01 — End: 2015-12-03

## 2015-12-01 MED ORDER — METHYLPREDNISOLONE SODIUM SUCC 125 MG IJ SOLR
60.0000 mg | Freq: Every day | INTRAMUSCULAR | Status: DC
  Administered 2015-12-02 – 2015-12-03 (×2): 60 mg via INTRAVENOUS
  Filled 2015-12-01 (×2): qty 2

## 2015-12-01 MED ORDER — INSULIN ASPART 100 UNIT/ML ~~LOC~~ SOLN
0.0000 [IU] | Freq: Every day | SUBCUTANEOUS | Status: DC
  Administered 2015-12-02: 2 [IU] via SUBCUTANEOUS
  Filled 2015-12-01: qty 2

## 2015-12-01 NOTE — Progress Notes (Signed)
Pharmacy Antibiotic Note  Michele Bartlett is a 45 y.o. female admitted on 11/30/2015 with pneumonia.  Pharmacy has been consulted for levofloxacin dosing. Patient was recently prescribed z-pak PTA for pneumonia. CrCl is 39 mL/min.  Patient is currently on day #2 of levofloxacin & doxycycline  Plan: Levofloxacin 750 mg IV q 48 hours   Height: 5\' 2"  (157.5 cm) Weight: 294 lb 8.6 oz (133.6 kg) IBW/kg (Calculated) : 50.1  Temp (24hrs), Avg:97.5 F (36.4 C), Min:96.5 F (35.8 C), Max:98.2 F (36.8 C)   Recent Labs Lab 11/30/15 0909 11/30/15 1434 12/01/15 0502  WBC 13.4* 10.3 11.3*  CREATININE 2.72* 2.51* 2.43*    Estimated Creatinine Clearance: 38.9 mL/min (by C-G formula based on Cr of 2.43).    Allergies  Allergen Reactions  . Novocain [Procaine] Rash    Other reaction(s): Other (See Comments) Skin peel raw all over    Antimicrobials this admission: levofloxacin 3/5 >>  doxycycline 3/5 >>   Microbiology results: 3/5 BCx: NGTD  Thank you for allowing pharmacy to be a part of this patient's care.  Lenis Noon, PharmD Clinical Pharmacist 12/01/2015 9:40 AM

## 2015-12-01 NOTE — Evaluation (Signed)
Physical Therapy Evaluation Patient Details Name: Michele Bartlett MRN: [id number] DOB: 09-06-1971 Today's Date: 12/01/2015   History of Present Illness  Pt admitted for ARF. Pt with negative knee/hip imaging. Pt with history of HTN, DM, and obesity. Pt with complaints of syncope and weakness  Clinical Impression  Pt is a pleasant 45 year old female who was admitted for ARF. Pt performs bed mobility with mod I and transfers/ambulation with cga and BRW. +2 assist required for safety as pt has history of falls at home. Pt demonstrates deficits with endurance/strength/mobility. All mobility performed on 2L of O2 with sats WNL. Sats decrease with exertion on room air. Would benefit from skilled PT to address above deficits and promote optimal return to PLOF.      Follow Up Recommendations Outpatient PT    Equipment Recommendations   (BRW)    Recommendations for Other Services       Precautions / Restrictions Precautions Precautions: Fall Restrictions Weight Bearing Restrictions: No      Mobility  Bed Mobility Overal bed mobility: Modified Independent             General bed mobility comments: safe technique with cues for sequencing. Uses bed rail for assistance  Transfers Overall transfer level: Needs assistance Equipment used:  (BRW) Transfers: Sit to/from Stand Sit to Stand: Min guard         General transfer comment: transfers performed with cga and safe technique. Cues given to push from seated surface prior to standing  Ambulation/Gait Ambulation/Gait assistance: Min guard Ambulation Distance (Feet): 30 Feet Assistive device:  (BRW) Gait Pattern/deviations: Step-through pattern     General Gait Details: ambulation in 2 bouts limited by O2 sats. First bout of ambulation for 10' on room air with sats decreasing to 82%, no SOB symptoms. Seated rest break with O2 sats returning to WNL. 2nd bout of 20' with 2L of O2 donned with decreased O2 sats to 92%. Safe  performance with BRW with reciprocal gait pattern.  Stairs            Wheelchair Mobility    Modified Rankin (Stroke Patients Only)       Balance Overall balance assessment: Needs assistance Sitting-balance support: Feet unsupported Sitting balance-Leahy Scale: Normal     Standing balance support: Bilateral upper extremity supported Standing balance-Leahy Scale: Good                               Pertinent Vitals/Pain Pain Assessment: No/denies pain    Home Living Family/patient expects to be discharged to:: Private residence Living Arrangements: Parent Available Help at Discharge: Family Type of Home: House Home Access: Stairs to enter Entrance Stairs-Rails: Right Entrance Stairs-Number of Steps: 3 Home Layout: One level Home Equipment: None      Prior Function Level of Independence: Independent         Comments: currently going to [facility]     Hand Dominance        Extremity/Trunk Assessment   Upper Extremity Assessment: Overall WFL for tasks assessed           Lower Extremity Assessment: Generalized weakness (grossly 4/5)         Communication   Communication: No difficulties  Cognition Arousal/Alertness: Awake/alert Behavior During Therapy: WFL for tasks assessed/performed Overall Cognitive Status: Within Functional Limits for tasks assessed  General Comments      Exercises Other Exercises Other Exercises: seated ther-ex performed including 10 reps on B LE ankle pumps, hip abd/add, and LAQ. All ther-ex performed while on 2L of O2 with safe technique.      Assessment/Plan    PT Assessment Patient needs continued PT services  PT Diagnosis Difficulty walking;Generalized weakness   PT Problem List Decreased strength;Decreased activity tolerance;Cardiopulmonary status limiting activity;Decreased balance  PT Treatment Interventions DME instruction;Therapeutic exercise   PT Goals (Current goals  can be found in the Care Plan section) Acute Rehab PT Goals Patient Stated Goal: to go home PT Goal Formulation: With patient Time For Goal Achievement: 12/15/15 Potential to Achieve Goals: Good    Frequency Min 2X/week   Barriers to discharge        Co-evaluation               End of Session Equipment Utilized During Treatment: Gait belt;Oxygen Activity Tolerance: Patient tolerated treatment well Patient left: in chair;with chair alarm set Nurse Communication: Mobility status         Time: [id number] PT Time Calculation (min) (ACUTE ONLY): 25 min   Charges:   PT Evaluation $PT Eval Moderate Complexity: 1 Procedure PT Treatments $Therapeutic Exercise: 8-22 mins   PT G Codes:        Aeris Hersman Dec 21, 2015, 10:44 AM  Greggory Stallion, PT, DPT [phone]

## 2015-12-01 NOTE — Progress Notes (Signed)
Patient ID: Michele Bartlett, female   DOB: 1971/08/30, 45 y.o.   MRN: [id number] [place] [facility] [place] NAME: Michele Bartlett    MR#:  [id number]  DATE OF BIRTH:  08-24-1971  SUBJECTIVE:  Came from home with increasing shortness of breath cough and had a syncopal episode. She was found to have bilateral pneumonia. Patient states today she is feeling better and eating well.  REVIEW OF SYSTEMS:   Review of Systems  Constitutional: Negative for fever, chills and weight loss.  HENT: Negative for ear discharge, ear pain and nosebleeds.   Eyes: Negative for blurred vision, pain and discharge.  Respiratory: Positive for cough and shortness of breath. Negative for sputum production, wheezing and stridor.   Cardiovascular: Negative for chest pain, palpitations, orthopnea and PND.  Gastrointestinal: Negative for nausea, vomiting, abdominal pain and diarrhea.  Genitourinary: Negative for urgency and frequency.  Musculoskeletal: Negative for back pain and joint pain.  Neurological: Positive for weakness. Negative for sensory change, speech change and focal weakness.  Psychiatric/Behavioral: Negative for depression and hallucinations. The patient is not nervous/anxious.   All other systems reviewed and are negative.  Tolerating Diet: Yes Tolerating PT: Outpatient PT  DRUG ALLERGIES:   Allergies  Allergen Reactions  . Novocain [Procaine] Rash    Other reaction(s): Other (See Comments) Skin peel raw all over    VITALS:  Blood pressure 151/100, pulse 72, temperature 96.5 F (35.8 C), temperature source Axillary, resp. rate 20, height 5\' 2"  (1.575 m), weight 133.6 kg (294 lb 8.6 oz), SpO2 97 %.  PHYSICAL EXAMINATION:   Physical Exam  GENERAL:  45 y.o.-year-old patient lying in the bed with no acute distress.  EYES: Pupils equal, round, reactive to light and accommodation. No scleral icterus. Extraocular muscles intact.  HEENT: Head atraumatic,  normocephalic. Oropharynx and nasopharynx clear.  NECK:  Supple, no jugular venous distention. No thyroid enlargement, no tenderness.  LUNGS: Coarse breath sounds bilaterally, no wheezing, rales, rhonchi. No use of accessory muscles of respiration.  CARDIOVASCULAR: S1, S2 normal. No murmurs, rubs, or gallops.  ABDOMEN: Soft, nontender, nondistended. Bowel sounds present. No organomegaly or mass.  EXTREMITIES: No cyanosis, clubbing or edema b/l.    NEUROLOGIC: Cranial nerves II through XII are intact. No focal Motor or sensory deficits b/l.   PSYCHIATRIC:  patient is alert and oriented x 3.  SKIN: No obvious rash, lesion, or ulcer.   LABORATORY PANEL:  CBC  Recent Labs Lab 12/01/15 0502  WBC 11.3*  HGB 12.5  HCT 39.7  PLT 310    Chemistries   Recent Labs Lab 12/01/15 0502  NA 138  K 5.1  CL 103  CO2 24  GLUCOSE 200*  BUN 38*  CREATININE 2.43*  CALCIUM 9.1  AST 15  ALT 13*  ALKPHOS 61  BILITOT 0.8   Cardiac Enzymes No results for input(s): TROPONINI in the last 168 hours. RADIOLOGY:  Dg Chest 2 View  11/30/2015  CLINICAL DATA:  state she has just finished a Azithromycin for upper respiratory infection and since taking has had c/o of tremors and general malaise with fever. Pt does not stand. EXAM: CHEST - 2 VIEW COMPARISON:  03/10/2012 FINDINGS: Mild cardiomegaly. New right middle lobe consolidation. Progressive left infrahilar consolidation/atelectasis. No effusion.  No pneumothorax. Visualized skeletal structures are unremarkable. IMPRESSION: 1. Worsening right middle lobe and left infrahilar consolidation/atelectasis. 2. Stable mild cardiomegaly. Electronically Signed   By: Lucrezia Europe M.D.   On: 11/30/2015 09:13  Dg Knee 2 Views Right  11/30/2015  CLINICAL DATA:  Multiple recent falls; pain in right hip and right knee now; pt naturally in very externally rotated position; positioning sponge used for AP positions; best possible images obtained EXAM: RIGHT KNEE - 1-2 VIEW  COMPARISON:  None. FINDINGS: There is no evidence of fracture, dislocation, or joint effusion. Small marginal spur from the patellar articular surface. Traction spur at the insertion of the quadriceps tendon. Benign appearing sclerotic lesion in the proximal tibial metaphysis. There is no evidence of arthropathy or other focal bone abnormality. Soft tissues are unremarkable. IMPRESSION: 1. Negative for acute abnormality. Electronically Signed   By: Lucrezia Europe M.D.   On: 11/30/2015 10:20   Dg Hip Unilat With Pelvis 2-3 Views Right  11/30/2015  CLINICAL DATA:  Multiple recent falls; pain in right hip and right knee now; pt naturally in very externally rotated position; positioning sponge used for AP positions; best possible images obtained EXAM: DG HIP (WITH OR WITHOUT PELVIS) 2-3V RIGHT COMPARISON:  CT 08/27/2008 FINDINGS: There is no evidence of hip fracture or dislocation. There is no evidence of arthropathy or other focal bone abnormality. IMPRESSION: Negative. Electronically Signed   By: Lucrezia Europe M.D.   On: 11/30/2015 10:21   ASSESSMENT AND PLAN:  Sarem Sergio is a 45 y.o. female has a past medical history significant for HTN, DM, and obesity recently treated for URI by PCP with Z-pak. Now with 2 episodes of severe weakness with syncope. Was brought to the ER where she was found to be hypoxic with pneumonia on CXR. She is now admitted. Has had fever with cough productive of brown sputum  1. Acute hypoxic respiratory failure secondary to bilateral pneumonia -Patient failed outpatient treatment with by mouth Zithromax -We'll start her on IV Levaquin -Continue oxygenation, breathing treatment, inhalers -Incentive spirometer/flutter valve -Negative for influenza  2. Morbid obesity  -PT recommends outpatient PT    3. Mild leukocytosis  4. CKD-III Monitor I's and O's avoid nephrotoxins give IV fluids gentle  5. Type 2 diabetes continue insulin sliding scale and by mouth meds   Case discussed  with Care Management/Social Worker. Management plans discussed with the patient, family and they are in agreement.  CODE STATUS: full  DVT Prophylaxis: Lovenox  TOTAL TIME TAKING CARE OF THIS PATIENT: 30 minutes.  >50% time spent on counselling and coordination of care mom  POSSIBLE D/C IN 1-2  DAYS, DEPENDING ON CLINICAL CONDITION.  Note: This dictation was prepared with Dragon dictation along with smaller phrase technology. Any transcriptional errors that result from this process are unintentional.  Tamanika Heiney M.D on 12/01/2015 at 2:37 PM  Between 7am to 6pm - Pager - [phone]  After 6pm go to [email or web address] - password [email or web address] [facility]  Office  [phone]  CC: Primary care physician; Lavera Guise, MD

## 2015-12-01 NOTE — Clinical Documentation Improvement (Signed)
Internal Medicine  Can the diagnosis of Chronic Kidney Disease be further specified? Please document response in next progress note NOT in BPA drop down box. Thank you!   CKD Stage I - GFR greater than or equal to 90  CKD Stage II - GFR 60-89  CKD Stage III - GFR 30-59  CKD Stage IV - GFR 15-29  CKD Stage V - GFR < 15  ESRD (End Stage Renal Disease)  Other condition  Unable to clinically determine  Supporting Information: : (risk factors, signs and symptoms, diagnostics, treatment)  Black female  GFR's running for current admission: 23 to 27  Please exercise your independent, professional judgment when responding. A specific answer is not anticipated or expected.   Thank You, [place] Cunningham [phone]

## 2015-12-02 LAB — BASIC METABOLIC PANEL
ANION GAP: 7 (ref 5–15)
BUN: 54 mg/dL — ABNORMAL HIGH (ref 6–20)
CALCIUM: 8.8 mg/dL — AB (ref 8.9–10.3)
CO2: 25 mmol/L (ref 22–32)
CREATININE: 3.04 mg/dL — AB (ref 0.44–1.00)
Chloride: 105 mmol/L (ref 101–111)
GFR calc non Af Amer: 18 mL/min — ABNORMAL LOW (ref >60)
GFR, EST AFRICAN AMERICAN: 20 mL/min — AB (ref >60)
Glucose, Bld: 153 mg/dL — ABNORMAL HIGH (ref 65–99)
Potassium: 5.3 mmol/L — ABNORMAL HIGH (ref 3.5–5.1)
Sodium: 137 mmol/L (ref 135–145)

## 2015-12-02 LAB — GLUCOSE, CAPILLARY
GLUCOSE-CAPILLARY: 197 mg/dL — AB (ref 65–99)
Glucose-Capillary: 129 mg/dL — ABNORMAL HIGH (ref 65–99)
Glucose-Capillary: 165 mg/dL — ABNORMAL HIGH (ref 65–99)
Glucose-Capillary: 233 mg/dL — ABNORMAL HIGH (ref 65–99)

## 2015-12-02 MED ORDER — ENOXAPARIN SODIUM 40 MG/0.4ML ~~LOC~~ SOLN
40.0000 mg | Freq: Two times a day (BID) | SUBCUTANEOUS | Status: DC
  Administered 2015-12-02 – 2015-12-03 (×3): 40 mg via SUBCUTANEOUS
  Filled 2015-12-02 (×3): qty 0.4

## 2015-12-02 MED ORDER — LEVOFLOXACIN 750 MG PO TABS: 750.0000 mg | ORAL_TABLET | ORAL | Status: DC

## 2015-12-02 MED ORDER — SENNA 8.6 MG PO TABS
1.0000 | ORAL_TABLET | Freq: Every day | ORAL | Status: DC
  Administered 2015-12-02 – 2015-12-03 (×2): 8.6 mg via ORAL
  Filled 2015-12-02 (×2): qty 1

## 2015-12-02 NOTE — Care Management Note (Signed)
Case Management Note  Patient Details  Name: Michele Bartlett MRN: [id number] Date of Birth: Nov 19, 1970  Subjective/Objective:       Anticipate discharge home with her Mother tomorrow. [facility]-PT recommends OP-PT. Oxygen weaned off at this time.               Action/Plan:   Expected Discharge Date:                  Expected Discharge Plan:     In-House Referral:     Discharge planning Services     Post Acute Care Choice:    Choice offered to:     DME Arranged:    DME Agency:     HH Arranged:    [place] Agency:     Status of Service:     Medicare Important Message Given:    Date Medicare IM Given:    Medicare IM give by:    Date Additional Medicare IM Given:    Additional Medicare Important Message give by:     If discussed at Wing of Stay Meetings, dates discussed:    Additional Comments:  Humzah Harty A, RN 12/02/2015, 3:28 PM

## 2015-12-02 NOTE — Progress Notes (Signed)
Patient ID: Michele Bartlett, female   DOB: 08-06-1971, 45 y.o.   MRN: [id number] [place] [facility] [place] NAME: Michele Bartlett    MR#:  [id number]  DATE OF BIRTH:  1970/11/16  SUBJECTIVE:  Came from home with increasing shortness of breath cough and had a syncopal episode. She was found to have bilateral pneumonia. Patient states today she is feeling better and eating well. She is off of oxygen. Her sats are stable on room air. Patient wants to get up and sit in the chair.  REVIEW OF SYSTEMS:   Review of Systems  Constitutional: Negative for fever, chills and weight loss.  HENT: Negative for ear discharge, ear pain and nosebleeds.   Eyes: Negative for blurred vision, pain and discharge.  Respiratory: Positive for cough and shortness of breath. Negative for sputum production, wheezing and stridor.   Cardiovascular: Negative for chest pain, palpitations, orthopnea and PND.  Gastrointestinal: Negative for nausea, vomiting, abdominal pain and diarrhea.  Genitourinary: Negative for urgency and frequency.  Musculoskeletal: Negative for back pain and joint pain.  Neurological: Positive for weakness. Negative for sensory change, speech change and focal weakness.  Psychiatric/Behavioral: Negative for depression and hallucinations. The patient is not nervous/anxious.   All other systems reviewed and are negative.  Tolerating Diet: Yes Tolerating PT: Outpatient PT  DRUG ALLERGIES:   Allergies  Allergen Reactions  . Novocain [Procaine] Rash    Other reaction(s): Other (See Comments) Skin peel raw all over    VITALS:  Blood pressure 96/81, pulse 74, temperature 98.6 F (37 C), temperature source Axillary, resp. rate 18, height 5\' 2"  (1.575 m), weight 138.1 kg (304 lb 7.3 oz), SpO2 93 %.  PHYSICAL EXAMINATION:   Physical Exam  GENERAL:  45 y.o.-year-old patient lying in the bed with no acute distress. Morbid obesity EYES: Pupils equal, round,  reactive to light and accommodation. No scleral icterus. Extraocular muscles intact.  HEENT: Head atraumatic, normocephalic. Oropharynx and nasopharynx clear.  NECK:  Supple, no jugular venous distention. No thyroid enlargement, no tenderness.  LUNGS: Coarse breath sounds bilaterally, no wheezing, rales, rhonchi. No use of accessory muscles of respiration.  CARDIOVASCULAR: S1, S2 normal. No murmurs, rubs, or gallops.  ABDOMEN: Soft, nontender, nondistended. Bowel sounds present. No organomegaly or mass.  EXTREMITIES: No cyanosis, clubbing or edema b/l.    NEUROLOGIC: Cranial nerves II through XII are intact. No focal Motor or sensory deficits b/l.   PSYCHIATRIC:  patient is alert and oriented x 3.  SKIN: No obvious rash, lesion, or ulcer.   LABORATORY PANEL:  CBC  Recent Labs Lab 12/01/15 0502  WBC 11.3*  HGB 12.5  HCT 39.7  PLT 310    Chemistries   Recent Labs Lab 12/01/15 0502 12/02/15 0549  NA 138 137  K 5.1 5.3*  CL 103 105  CO2 24 25  GLUCOSE 200* 153*  BUN 38* 54*  CREATININE 2.43* 3.04*  CALCIUM 9.1 8.8*  AST 15  --   ALT 13*  --   ALKPHOS 61  --   BILITOT 0.8  --    Cardiac Enzymes No results for input(s): TROPONINI in the last 168 hours. RADIOLOGY:  No results found. ASSESSMENT AND PLAN:  Michele Bartlett is a 45 y.o. female has a past medical history significant for HTN, DM, and obesity recently treated for URI by PCP with Z-pak. Now with 2 episodes of severe weakness with syncope. Was brought to the ER where she was found  to be hypoxic with pneumonia on CXR. She is now admitted. Has had fever with cough productive of brown sputum  1. Acute hypoxic respiratory failure secondary to bilateral pneumonia -Patient failed outpatient treatment with by mouth Zithromax - on IV Levaquin -Continue oxygenation, breathing treatment, inhalers... Patient has been weaned off to room air. -Incentive spirometer/flutter valve -Negative for influenza  2. Morbid obesity  -PT  recommends outpatient PT    3. Mild leukocytosis  4. CKD-III Monitor I's and O's avoid nephrotoxins give IV fluids gentle -Creatinine little bit on the higher side. We'll continue IV hydration. Patient is making good urine.  5. Type 2 diabetes continue insulin sliding scale and by mouth meds  If continues to show improvement will DC home tomorrow this was discussed with patient's mother. Case discussed with Care Management/Social Worker. Management plans discussed with the patient, family and they are in agreement.  CODE STATUS: full  DVT Prophylaxis: Lovenox  TOTAL TIME TAKING CARE OF THIS PATIENT: 30 minutes.  >50% time spent on counselling and coordination of care mom  POSSIBLE D/C IN 1-2  DAYS, DEPENDING ON CLINICAL CONDITION.  Note: This dictation was prepared with Dragon dictation along with smaller phrase technology. Any transcriptional errors that result from this process are unintentional.  Careena Degraffenreid M.D on 12/02/2015 at 2:12 PM  Between 7am to 6pm - Pager - [phone]  After 6pm go to [email or web address] - password [email or web address] [facility]  Office  [phone]  CC: Primary care physician; Lavera Guise, MD

## 2015-12-02 NOTE — Progress Notes (Signed)
Dr. Posey Pronto notified of patient's potassium level. Acknowledged.

## 2015-12-02 NOTE — Progress Notes (Signed)
Physical Therapy Treatment Patient Details Name: Michele Bartlett MRN: [id number] DOB: 1971/04/09 Today's Date: 12/02/2015    History of Present Illness Pt admitted for ARF. Pt with negative knee/hip imaging. Pt with history of HTN, DM, and obesity. Pt with complaints of syncope and weakness    PT Comments    Pt is making good progress towards goals with increased ambulation distance noted this session. Pt very motivated to perform therapy. Seated rest break required secondary to fatigue, however no SOB and O2 sats WNL. Improved tolerance noted with there-ex. Still needs guidance on correct use of RW.  Follow Up Recommendations  Outpatient PT     Equipment Recommendations   (BRW)    Recommendations for Other Services       Precautions / Restrictions Precautions Precautions: Fall Restrictions Weight Bearing Restrictions: No    Mobility  Bed Mobility               General bed mobility comments: not performed as pt in recliner  Transfers Overall transfer level: Needs assistance Equipment used:  (BRW) Transfers: Sit to/from Stand Sit to Stand: Min guard         General transfer comment: transfers performed with cga and safe technique. Cues given to push from seated surface prior to standing  Ambulation/Gait Ambulation/Gait assistance: Min guard Ambulation Distance (Feet): 50 Feet Assistive device:  (BRW) Gait Pattern/deviations: Step-through pattern     General Gait Details: ambulation performed with 2 seated rest breaks secondary to fatigue. Pt with reciprocal gait pattern and improved technique. Still needs cues for guidance on using rw correctly. O2 sats on room air at 98% with exertion. No SOB symptoms noted. 2nd person in room following with recliner   Stairs            Wheelchair Mobility    Modified Rankin (Stroke Patients Only)       Balance                                    Cognition Arousal/Alertness:  Awake/alert Behavior During Therapy: WFL for tasks assessed/performed Overall Cognitive Status: Within Functional Limits for tasks assessed                      Exercises Other Exercises Other Exercises: seated ther-ex performed x 12 reps on B LE including ankle pumps, quad sets, heel slides, bicep curls, shoulder press, and shoulder shrugs. All ther-ex performed with supervision and cues for technique.    General Comments        Pertinent Vitals/Pain Pain Assessment: No/denies pain    Home Living                      Prior Function            PT Goals (current goals can now be found in the care plan section) Acute Rehab PT Goals Patient Stated Goal: to go home PT Goal Formulation: With patient Time For Goal Achievement: 12/15/15 Potential to Achieve Goals: Good Progress towards PT goals: Progressing toward goals    Frequency  Min 2X/week    PT Plan Current plan remains appropriate    Co-evaluation             End of Session Equipment Utilized During Treatment: Gait belt Activity Tolerance: Patient tolerated treatment well Patient left: in chair;with chair alarm set     Time: [id number] PT  Time Calculation (min) (ACUTE ONLY): 23 min  Charges:  $Gait Training: 8-22 mins $Therapeutic Exercise: 8-22 mins                    G Codes:      Kelyse Pask December 24, 2015, 4:52 PM  Greggory Stallion, PT, DPT [phone]

## 2015-12-02 NOTE — Progress Notes (Signed)
Order for enoxaparin 40 mg subcutaneously daily for DVT prophylaxis was changed to enoxaparin 40 mg BID per anticoagulation protocol for CrCl > 30 mL/min & BMI > 40.   Lenis Noon, PharmD Clinical Pharmacist 12/02/15 8:52 AM

## 2015-12-03 LAB — BASIC METABOLIC PANEL
Anion gap: 9 (ref 5–15)
BUN: 69 mg/dL — ABNORMAL HIGH (ref 6–20)
CHLORIDE: 107 mmol/L (ref 101–111)
CO2: 22 mmol/L (ref 22–32)
CREATININE: 3.21 mg/dL — AB (ref 0.44–1.00)
Calcium: 8.7 mg/dL — ABNORMAL LOW (ref 8.9–10.3)
GFR calc non Af Amer: 16 mL/min — ABNORMAL LOW (ref >60)
GFR, EST AFRICAN AMERICAN: 19 mL/min — AB (ref >60)
Glucose, Bld: 168 mg/dL — ABNORMAL HIGH (ref 65–99)
Potassium: 5 mmol/L (ref 3.5–5.1)
Sodium: 138 mmol/L (ref 135–145)

## 2015-12-03 LAB — GLUCOSE, CAPILLARY
GLUCOSE-CAPILLARY: 171 mg/dL — AB (ref 65–99)
Glucose-Capillary: 156 mg/dL — ABNORMAL HIGH (ref 65–99)

## 2015-12-03 MED ORDER — LEVOFLOXACIN 750 MG PO TABS: 750.0000 mg | ORAL_TABLET | ORAL | Status: DC

## 2015-12-03 NOTE — Discharge Summary (Signed)
[facility] NAME: Michele Bartlett    MR#:  [id number]  DATE OF BIRTH:  [birth year]  DATE OF ADMISSION:  11/30/2015 ADMITTING PHYSICIAN: Idelle Crouch, MD  DATE OF DISCHARGE: 12/03/15  PRIMARY CARE PHYSICIAN: Lavera Guise, MD    ADMISSION DIAGNOSIS:  Community acquired pneumonia [J18.9] CAP (community acquired pneumonia) [J18.9] AKI (acute kidney injury) ([place]) [N17.9] Fall, initial encounter [W19.XXXA]  DISCHARGE DIAGNOSIS:  Pneumonia-Bilateral Acute renal fialure-improved  SECONDARY DIAGNOSIS:   Past Medical History  Diagnosis Date  . Hypertension   . Diabetes mellitus without complication ([place])   . Kidney disease     HOSPITAL COURSE:  Michele Bartlett is a 45 y.o. female has a past medical history significant for HTN, DM, and obesity recently treated for URI by PCP with Z-pak. Now with 2 episodes of severe weakness with syncope. Was brought to the ER where she was found to be hypoxic with pneumonia on CXR. She is now admitted. Has had fever with cough productive of brown sputum  1. Acute hypoxic respiratory failure secondary to bilateral pneumonia -Patient failed outpatient treatment with by mouth Zithromax - on po Levaquin -Continue oxygenation, breathing treatment, inhalers... Patient has been weaned off to room air. -Incentive spirometer/flutter valve -Negative for influenza  2. Morbid obesity  -PT recommends outpatient PT   3. Mild leukocytosis  4. CKD-III Monitor I's and O's avoid nephrotoxins give IV fluids gentle -Creatinine little bit on the higher side. We'll continue IV hydration. Patient is making good urine.  5. Type 2 diabetes continue insulin sliding scale and by mouth meds Overall at baseline D/w mom. Ok to go home  CONSULTS OBTAINED:     DRUG ALLERGIES:   Allergies  Allergen Reactions  . Novocain [Procaine] Rash    Other reaction(s): Other (See Comments) Skin peel raw all over     DISCHARGE MEDICATIONS:   Current Discharge Medication List    START taking these medications   Details  levofloxacin (LEVAQUIN) 750 MG tablet Take 1 tablet (750 mg total) by mouth every other day. Qty: 3 tablet, Refills: 0      CONTINUE these medications which have NOT CHANGED   Details  albuterol (PROAIR HFA) 108 (90 Base) MCG/ACT inhaler Inhale 2 puffs into the lungs every 6 (six) hours as needed. For  Wheezing.    albuterol (PROVENTIL) (2.5 MG/3ML) 0.083% nebulizer solution Take 2.5 mg by nebulization every 6 (six) hours as needed for wheezing or shortness of breath.    azithromycin (ZITHROMAX) 250 MG tablet Take 250-500 mg by mouth See admin instructions. Take 2 tablets (500mg ) by mouth now, then take 1 tablet by mouth once a day x 4 days.    benazepril (LOTENSIN) 40 MG tablet Take 40 mg by mouth daily.    Dextromethorphan-Guaifenesin (MUCINEX DM MAXIMUM STRENGTH) 60-1200 MG TB12 Take 1 tablet by mouth every 12 (twelve) hours.    fluticasone (FLONASE) 50 MCG/ACT nasal spray Place 1 spray into both nostrils 2 (two) times daily as needed for allergies or rhinitis. Reported on 11/30/2015    furosemide (LASIX) 40 MG tablet Take 40 mg by mouth every other day.    levocetirizine (XYZAL) 5 MG tablet Take 5 mg by mouth daily.    metoprolol tartrate (LOPRESSOR) 25 MG tablet Take 25 mg by mouth 2 (two) times daily.    mometasone-formoterol (DULERA) 100-5 MCG/ACT AERO Inhale 2 puffs into the lungs every 12 (twelve) hours as needed for wheezing or  shortness of breath.    OXYGEN Inhale 2 L/min into the lungs at bedtime.    VICTOZA 18 MG/3ML SOPN Inject 1.2 mg into the skin daily.        If you experience worsening of your admission symptoms, develop shortness of breath, life threatening emergency, suicidal or homicidal thoughts you must seek medical attention immediately by calling 911 or calling your MD immediately  if symptoms less severe.  You Must read complete  instructions/literature along with all the possible adverse reactions/side effects for all the Medicines you take and that have been prescribed to you. Take any new Medicines after you have completely understood and accept all the possible adverse reactions/side effects.   Please note  You were cared for by a hospitalist during your hospital stay. If you have any questions about your discharge medications or the care you received while you were in the hospital after you are discharged, you can call the unit and asked to speak with the hospitalist on call if the hospitalist that took care of you is not available. Once you are discharged, your primary care physician will handle any further medical issues. Please note that NO REFILLS for any discharge medications will be authorized once you are discharged, as it is imperative that you return to your primary care physician (or establish a relationship with a primary care physician if you do not have one) for your aftercare needs so that they can reassess your need for medications and monitor your lab values. Today   SUBJECTIVE  Doing well   VITAL SIGNS:  Blood pressure 114/66, pulse 73, temperature 98.4 F (36.9 C), temperature source Oral, resp. rate 18, height 5\' 2"  (1.575 m), weight 138.1 kg (304 lb 7.3 oz), SpO2 95 %.  I/O:   Intake/Output Summary (Last 24 hours) at 12/03/15 1249 Last data filed at 12/03/15 0929  Gross per 24 hour  Intake    360 ml  Output      0 ml  Net    360 ml    PHYSICAL EXAMINATION:  GENERAL:  45 y.o.-year-old patient lying in the bed with no acute distress.  EYES: Pupils equal, round, reactive to light and accommodation. No scleral icterus. Extraocular muscles intact.  HEENT: Head atraumatic, normocephalic. Oropharynx and nasopharynx clear.  NECK:  Supple, no jugular venous distention. No thyroid enlargement, no tenderness.  LUNGS: Normal breath sounds bilaterally, no wheezing, rales,rhonchi or crepitation. No  use of accessory muscles of respiration.  CARDIOVASCULAR: S1, S2 normal. No murmurs, rubs, or gallops.  ABDOMEN: Soft, non-tender, non-distended. Bowel sounds present. No organomegaly or mass.  EXTREMITIES: No pedal edema, cyanosis, or clubbing.  NEUROLOGIC: Cranial nerves II through XII are intact. Muscle strength 5/5 in all extremities. Sensation intact. Gait not checked.  PSYCHIATRIC: The patient is alert and oriented x 3.  SKIN: No obvious rash, lesion, or ulcer.   DATA REVIEW:   CBC   Recent Labs Lab 12/01/15 0502  WBC 11.3*  HGB 12.5  HCT 39.7  PLT 310    Chemistries   Recent Labs Lab 12/01/15 0502  12/03/15 0638  NA 138  < > 138  K 5.1  < > 5.0  CL 103  < > 107  CO2 24  < > 22  GLUCOSE 200*  < > 168*  BUN 38*  < > 69*  CREATININE 2.43*  < > 3.21*  CALCIUM 9.1  < > 8.7*  AST 15  --   --   ALT 13*  --   --  ALKPHOS 61  --   --   BILITOT 0.8  --   --   < > = values in this interval not displayed.  Microbiology Results   Recent Results (from the past 240 hour(s))  Rapid Influenza A&B Antigens ([facility] only)     Status: None   Collection Time: 11/30/15 11:30 AM  Result Value Ref Range Status   Influenza A ([facility]) NEGATIVE NEGATIVE Final   Influenza B ([facility]) NEGATIVE NEGATIVE Final  Blood culture (routine x 2)     Status: None (Preliminary result)   Collection Time: 11/30/15 11:30 AM  Result Value Ref Range Status   Specimen Description BLOOD RIGHT HAND  Final   Special Requests   Final    BOTTLES DRAWN AEROBIC AND ANAEROBIC  AER 1CC ANA 3CC   Culture NO GROWTH 3 DAYS  Final   Report Status PENDING  Incomplete  Blood culture (routine x 2)     Status: None (Preliminary result)   Collection Time: 11/30/15 11:30 AM  Result Value Ref Range Status   Specimen Description BLOOD LEFT ANTECUBITAL  Final   Special Requests   Final    BOTTLES DRAWN AEROBIC AND ANAEROBIC  AER 5CC ANA 3CC   Culture NO GROWTH 3 DAYS  Final   Report Status PENDING  Incomplete     RADIOLOGY:  No results found.   Management plans discussed with the patient, family and they are in agreement.  CODE STATUS:     Code Status Orders        Start     Ordered   11/30/15 1419  Full code   Continuous     11/30/15 1419    Code Status History    Date Active Date Inactive Code Status Order ID Comments User Context   This patient has a current code status but no historical code status.    Advance Directive Documentation        Most Recent Value   Type of Advance Directive  Healthcare Power of Attorney   Pre-existing out of facility DNR order (yellow form or pink MOST form)     "MOST" Form in Place?        TOTAL TIME TAKING CARE OF THIS PATIENT: 40 minutes.    Raeghan Demeter M.D on 12/03/2015 at 12:49 PM  Between 7am to 6pm - Pager - [phone] After 6pm go to [email or web address] - password [email or web address] [facility]  Office  [phone]  CC: Primary care physician; Lavera Guise, MD

## 2015-12-03 NOTE — Care Management Important Message (Signed)
Important Message  Patient Details  Name: Hanh Pinks MRN: [id number] Date of Birth: Mar 03, 1971   Medicare Important Message Given:  Yes    Marshell Garfinkel, RN 12/03/2015, 8:40 AM

## 2015-12-03 NOTE — Care Management (Signed)
Bariatric walker requested from [place] to be delivered prior to discharge.I spoke with patient's mother to update and she agrees to taking patient to OPPT and knows to wait on walker delivery. No further RNCM needs. Case closed.

## 2015-12-05 LAB — CULTURE, BLOOD (ROUTINE X 2)
CULTURE: NO GROWTH
Culture: NO GROWTH

## 2015-12-08 ENCOUNTER — Other Ambulatory Visit
Admission: RE | Admit: 2015-12-08 | Discharge: 2015-12-08 | Disposition: A | Discharge status: Home / Self Care | Payer: Medicare Other | Source: Ambulatory Visit | Attending: Nephrology | Admitting: Nephrology

## 2015-12-08 DIAGNOSIS — N184 Chronic kidney disease, stage 4 (severe): Secondary | ICD-10-CM | POA: Insufficient documentation

## 2015-12-08 LAB — CBC WITH DIFFERENTIAL/PLATELET
BASOS ABS: 0.1 10*3/uL (ref 0–0.1)
BASOS PCT: 1 %
Eosinophils Absolute: 0.2 10*3/uL (ref 0–0.7)
Eosinophils Relative: 2 %
HEMATOCRIT: 43.8 % (ref 35.0–47.0)
HEMOGLOBIN: 13.9 g/dL (ref 12.0–16.0)
LYMPHS ABS: 1.7 10*3/uL (ref 1.0–3.6)
Lymphocytes Relative: 17 %
MCH: 28.5 pg (ref 26.0–34.0)
MCHC: 31.8 g/dL — ABNORMAL LOW (ref 32.0–36.0)
MCV: 89.4 fL (ref 80.0–100.0)
MONO ABS: 1 10*3/uL — AB (ref 0.2–0.9)
MONOS PCT: 10 %
NEUTROS ABS: 7.3 10*3/uL — AB (ref 1.4–6.5)
Neutrophils Relative %: 70 %
Platelets: 313 10*3/uL (ref 150–440)
RBC: 4.89 MIL/uL (ref 3.80–5.20)
RDW: 16.4 % — AB (ref 11.5–14.5)
WBC: 10.4 10*3/uL (ref 3.6–11.0)

## 2015-12-08 LAB — COMPREHENSIVE METABOLIC PANEL
ALBUMIN: 3 g/dL — AB (ref 3.5–5.0)
ALK PHOS: 40 U/L (ref 38–126)
ALT: 15 U/L (ref 14–54)
AST: 13 U/L — ABNORMAL LOW (ref 15–41)
Anion gap: 3 — ABNORMAL LOW (ref 5–15)
BUN: 52 mg/dL — ABNORMAL HIGH (ref 6–20)
CALCIUM: 8.5 mg/dL — AB (ref 8.9–10.3)
CHLORIDE: 105 mmol/L (ref 101–111)
CO2: 30 mmol/L (ref 22–32)
CREATININE: 2.77 mg/dL — AB (ref 0.44–1.00)
GFR calc Af Amer: 23 mL/min — ABNORMAL LOW (ref >60)
GFR calc non Af Amer: 20 mL/min — ABNORMAL LOW (ref >60)
GLUCOSE: 129 mg/dL — AB (ref 65–99)
Potassium: 4.5 mmol/L (ref 3.5–5.1)
SODIUM: 138 mmol/L (ref 135–145)
Total Bilirubin: 0.7 mg/dL (ref 0.3–1.2)
Total Protein: 6.4 g/dL — ABNORMAL LOW (ref 6.5–8.1)

## 2015-12-08 LAB — PROTEIN / CREATININE RATIO, URINE
Creatinine, Urine: 155 mg/dL
Protein Creatinine Ratio: 0.52 mg/mg{Cre} — ABNORMAL HIGH (ref 0.00–0.15)
Total Protein, Urine: 80 mg/dL

## 2015-12-08 LAB — PHOSPHORUS: Phosphorus: 3.5 mg/dL (ref 2.5–4.6)

## 2015-12-09 LAB — PARATHYROID HORMONE, INTACT (NO CA): PTH: 100 pg/mL — AB (ref 15–65)

## 2015-12-15 ENCOUNTER — Encounter: Payer: Self-pay | Admitting: Physical Therapy

## 2015-12-15 ENCOUNTER — Ambulatory Visit: Payer: Medicare Other | Attending: Internal Medicine | Admitting: Physical Therapy

## 2015-12-15 DIAGNOSIS — R531 Weakness: Secondary | ICD-10-CM | POA: Diagnosis not present

## 2015-12-15 NOTE — Therapy (Addendum)
[place] MAIN [facility] SERVICES [address] [place], [place], [address] Phone: [phone]   Fax:  [phone]  Physical Therapy Treatment  Patient Details  Name: Michele Bartlett MRN: [id number] Date of Birth: 1971-08-14 Referring Provider: Dr. Posey Pronto   Encounter Date: 12/15/2015      PT End of Session - 12/15/15 1808    Visit Number 1   Number of Visits 9   Date for PT Re-Evaluation 01/19/16   PT Start Time 0410   PT Stop Time 0500   PT Time Calculation (min) 50 min      Past Medical History  Diagnosis Date  . Hypertension   . Diabetes mellitus without complication ([place])   . Kidney disease     History reviewed. No pertinent past surgical history.  There were no vitals filed for this visit.  Visit Diagnosis:  Weakness      Subjective Assessment - 12/15/15 1624    Subjective Patient is feeling better now but not completely.    Currently in Pain? No/denies            [facility] PT Assessment - 12/15/15 0001    Assessment   Medical Diagnosis pneumonia   Referring Provider Dr. Posey Pronto    Onset Date/Surgical Date 11/30/15   Hand Dominance Right   Next MD Visit 12/18/15   Prior Therapy hospital   Precautions   Precautions None   Restrictions   Weight Bearing Restrictions Yes   Balance Screen   Has the patient fallen in the past 6 months No   Has the patient had a decrease in activity level because of a fear of falling?  Yes   Is the patient reluctant to leave their home because of a fear of falling?  No   Home Social worker Private residence   Living Arrangements Parent   Available Help at Discharge Family   Type of [place] to enter   Entrance Stairs-Number of Steps 3   Entrance Stairs-Rails Right   Home Layout One level   [place] - 2 wheels   Prior Function   Level of Independence Independent;Needs assistance with ADLs   Vocation Other (comment)   Vocation  Requirements computer lab      PAIN: no reports of pain  POSTURE: WNL   PROM/AROM: WFL  STRENGTH:  Graded on a 0-5 scale Muscle Group Left Right  Shoulder flex    Shoulder Abd    Shoulder Ext    Shoulder IR/ER    Elbow    Wrist/hand    Hip Flex 3+ 3+  Hip Abd 2 2  Hip Add 2 2  Hip Ext 2 2  Hip IR/ER 3 3  Knee Flex 3 3  Knee Ext 3 3  Ankle DF 3 3  Ankle PF 3 3   SENSATION: WNL     FUNCTIONAL MOBILITY: independent  BALANCE: WFL    GAIT: DECREASED 6 mw  OUTCOME MEASURES: TEST Outcome Interpretation  5 times sit<>stand 13. 59sec >60 yo, >15 sec indicates increased risk for falls  10 meter walk test  .78               m/s <1.0 m/s indicates increased risk for falls; limited community ambulator  Timed up and Go    12              sec <14 sec indicates increased risk for falls  6 minute  walk test     700           Feet 1000 feet is community Water quality scientist  <36/56 (100% risk for falls), 37-45 (80% risk for falls); 46-51 (>50% risk for falls); 52-55 (lower risk <25% of falls)                                      PT Long Term Goals - 01-09-16 1656    PT LONG TERM GOAL #1   Title Patient will increase six minute walk test distance to >1000 for progression to community ambulator and improve gait ability   Time 4   Period Weeks   Status Partially Met   PT LONG TERM GOAL #2   Title Patient will increase 10 meter walk test to >1.34ms as to improve gait speed for better community ambulation and to reduce fall risk   Time 4   Status Partially Met   PT LONG TERM GOAL #3   Title Patient will increase BLE gross strength to 4+/5 as to improve functional strength for independent gait, increased standing tolerance and increased ADL ability   Time 4   Status Partially Met               Plan - 0Apr 14, 20171649    Clinical Impression Statement Patient was recently discharged from hospital with pneumonia  and now has  decreased strength, gait speed, and mobiltiy impairments.    Pt will benefit from skilled therapeutic intervention in order to improve on the following deficits Abnormal gait;Decreased activity tolerance;Decreased endurance;Decreased strength;Difficulty walking   Rehab Potential Good   PT Frequency 2x / week   PT Duration 4 weeks   PT Treatment/Interventions Therapeutic exercise;Therapeutic activities;Gait training   PT Home Exercise Plan HEP          G-Codes - 0April 14, 20171648    Functional Assessment Tool Used 6 mw, 10 mw, 5 X Sit to stand   Functional Limitation Mobility: Walking and moving around   Mobility: Walking and Moving Around Current Status [phone] At least 40 percent but less than 60 percent impaired, limited or restricted   Mobility: Walking and Moving Around Goal Status [phone] At least 20 percent but less than 40 percent impaired, limited or restricted      Problem List Patient Active Problem List   Diagnosis Date Noted  . Acute respiratory failure ([place] 11/30/2015  . CAP (community acquired pneumonia) 11/30/2015  . Syncope and collapse 11/30/2015  . Diabetes mellitus without complication ([place] 032/54/9826 KAlanson Puls PT, DPT  [place] K[place]ette HeadlandS 304/14/17 6:09 PM  [place] [address] [address]Phone: [phone]  Fax:  [phone] Name: Michele WeldonMRN: [id number] of Birth: [birth year]

## 2015-12-17 ENCOUNTER — Encounter: Payer: Self-pay | Admitting: Physical Therapy

## 2015-12-17 ENCOUNTER — Ambulatory Visit: Payer: Medicare Other | Admitting: Physical Therapy

## 2015-12-17 DIAGNOSIS — R531 Weakness: Secondary | ICD-10-CM | POA: Diagnosis not present

## 2015-12-17 NOTE — Therapy (Signed)
[place] MAIN [facility] SERVICES [address] [place], [place], [address] Phone: [phone]   Fax:  [phone]  Physical Therapy Treatment  Patient Details  Name: Michele Bartlett MRN: [id number] Date of Birth: Feb 16, 1971 Referring Provider: Dr. Posey Pronto   Encounter Date: 12/17/2015      PT End of Session - 12/17/15 1722    Visit Number 2   Number of Visits 9   Date for PT Re-Evaluation 01/19/16   PT Start Time 0455   PT Stop Time 0535   PT Time Calculation (min) 40 min   Equipment Utilized During Treatment Gait belt   Activity Tolerance Patient tolerated treatment well;Patient limited by fatigue      Past Medical History  Diagnosis Date  . Hypertension   . Diabetes mellitus without complication ([place])   . Kidney disease     History reviewed. No pertinent past surgical history.  There were no vitals filed for this visit.  Visit Diagnosis:  Weakness      Subjective Assessment - 12/17/15 1722    Subjective Patient is feeling better now but not completely.    Currently in Pain? No/denies      TM walking x 10 mintues . 6 miles / hour standing hip abd with YTB x 20  side stepping left and right in parallel bars 10 feet x 3 step ups from floor to 6 inch stool x 20 bilateral sit to stand x 10 marching in parallel bars x 20  Matrix rows bilaterally, PNF D1, D2 x 10 Cues for posture and safety with no rest periods                           PT Education - 12/17/15 1722    Education provided Yes   Education Details importance of getting active   Person(s) Educated Patient   Methods Explanation   Comprehension Verbalized understanding             PT Long Term Goals - 12/15/15 1656    PT LONG TERM GOAL #1   Title Patient will increase six minute walk test distance to >1000 for progression to community ambulator and improve gait ability   Time 4   Period Weeks   Status Partially Met   PT LONG TERM  GOAL #2   Title Patient will increase 10 meter walk test to >1.26ms as to improve gait speed for better community ambulation and to reduce fall risk   Time 4   Status Partially Met   PT LONG TERM GOAL #3   Title Patient will increase BLE gross strength to 4+/5 as to improve functional strength for independent gait, increased standing tolerance and increased ADL ability   Time 4   Status Partially Met               Plan - 12/17/15 1723    Clinical Impression Statement Pateint tolerated LE exericses and endurance training with TM walking.    Pt will benefit from skilled therapeutic intervention in order to improve on the following deficits Abnormal gait;Decreased activity tolerance;Decreased endurance;Decreased strength;Difficulty walking   Rehab Potential Good   PT Frequency 2x / week   PT Duration 4 weeks   PT Treatment/Interventions Therapeutic exercise;Therapeutic activities;Gait training   PT Home Exercise Plan HEP        Problem List Patient Active Problem List   Diagnosis Date Noted  . Acute respiratory failure ([place] 11/30/2015  .  CAP (community acquired pneumonia) 11/30/2015  . Syncope and collapse 11/30/2015  . Diabetes mellitus without complication ([place]) 20/06/711  Alanson Puls, PT, DPT  [place], Minette Headland S 12/17/2015, 5:24 PM  [place] MAIN [facility] SERVICES [address] [place], [place], [address] Phone: [phone]   Fax:  [phone]  Name: Michele Bartlett MRN: [id number] Date of Birth: 1971/03/03

## 2015-12-22 ENCOUNTER — Encounter: Payer: Self-pay | Admitting: Physical Therapy

## 2015-12-22 ENCOUNTER — Ambulatory Visit: Payer: Medicare Other | Admitting: Physical Therapy

## 2015-12-22 DIAGNOSIS — R531 Weakness: Secondary | ICD-10-CM | POA: Diagnosis not present

## 2015-12-22 NOTE — Therapy (Signed)
[place] MAIN [facility] SERVICES [address] [place], [place], [address] Phone: [phone]   Fax:  [phone]  Physical Therapy Treatment  Patient Details  Name: Michele Bartlett MRN: [id number] Date of Birth: 02-25-1971 Referring Provider: Dr. Posey Pronto   Encounter Date: 12/22/2015      PT End of Session - 12/22/15 1147    Visit Number 2   Number of Visits 9   Date for PT Re-Evaluation 01/19/16   PT Start Time 1145   PT Stop Time 1230   PT Time Calculation (min) 45 min   Equipment Utilized During Treatment Gait belt   Activity Tolerance Patient tolerated treatment well;Patient limited by fatigue      Past Medical History  Diagnosis Date  . Hypertension   . Diabetes mellitus without complication ([place])   . Kidney disease     History reviewed. No pertinent past surgical history.  There were no vitals filed for this visit.  Visit Diagnosis:  Weakness      Subjective Assessment - 12/22/15 1147    Subjective Patient is feeling better now but not completely.    Currently in Pain? No/denies     TM walking x 1. 0 miles/ hour standing hip abd, flex, extension  with YTB x 20  step ups from floor to 6 inch stool x 20 bilateral sit to stand x 10 marching in parallel bars x 20 Matrix UE rows x 20 bilaterally Matrix PNF patterns D1, D2  X 20 bilaterally  Patient needs cues for correct posture and for techniques.                            PT Education - 12/22/15 1147    Education provided Yes   Education Details importance of getting active   Person(s) Educated Patient   Methods Explanation   Comprehension Verbalized understanding             PT Long Term Goals - 12/15/15 1656    PT LONG TERM GOAL #1   Title Patient will increase six minute walk test distance to >1000 for progression to community ambulator and improve gait ability   Time 4   Period Weeks   Status Partially Met   PT LONG TERM GOAL #2    Title Patient will increase 10 meter walk test to >1.1ms as to improve gait speed for better community ambulation and to reduce fall risk   Time 4   Status Partially Met   PT LONG TERM GOAL #3   Title Patient will increase BLE gross strength to 4+/5 as to improve functional strength for independent gait, increased standing tolerance and increased ADL ability   Time 4   Status Partially Met               Plan - 12/22/15 1148    Clinical Impression Statement Patient tolerated LLE exercises and endurance training wiht TM walking. Patient has no reports of pain after therapy but does have some fatigue following.   Pt will benefit from skilled therapeutic intervention in order to improve on the following deficits Abnormal gait;Decreased activity tolerance;Decreased endurance;Decreased strength;Difficulty walking   Rehab Potential Good   PT Frequency 2x / week   PT Duration 4 weeks   PT Treatment/Interventions Therapeutic exercise;Therapeutic activities;Gait training   PT Home Exercise Plan HEP        Problem List Patient Active Problem List   Diagnosis Date Noted  .  Acute respiratory failure ([place]) 11/30/2015  . CAP (community acquired pneumonia) 11/30/2015  . Syncope and collapse 11/30/2015  . Diabetes mellitus without complication ([place]) 50/75/7322  Alanson Puls, PT, DPT  [place] 12/22/2015, 11:50 AM  [place] MAIN [facility] SERVICES [address] [place], [place], [address] Phone: [phone]   Fax:  [phone]  Name: Michele Bartlett MRN: [id number] Date of Birth: [birth year]

## 2015-12-24 ENCOUNTER — Ambulatory Visit: Payer: Medicare Other | Admitting: Physical Therapy

## 2015-12-24 ENCOUNTER — Encounter: Payer: Self-pay | Admitting: Physical Therapy

## 2015-12-24 DIAGNOSIS — R531 Weakness: Secondary | ICD-10-CM

## 2015-12-24 NOTE — Therapy (Signed)
[place] MAIN [facility] SERVICES [address] [place], [place], [address] Phone: [phone]   Fax:  [phone]  Physical Therapy Treatment  Patient Details  Name: Michele Bartlett MRN: [id number] Date of Birth: January 01, 1971 Referring Provider: Dr. Posey Pronto   Encounter Date: 12/24/2015      PT End of Session - 12/24/15 1158    Visit Number 3   Number of Visits 9   Date for PT Re-Evaluation 01/19/16   PT Start Time 1150   PT Stop Time 1230   PT Time Calculation (min) 40 min   Equipment Utilized During Treatment Gait belt   Activity Tolerance Patient tolerated treatment well;Patient limited by fatigue      Past Medical History  Diagnosis Date  . Hypertension   . Diabetes mellitus without complication ([place])   . Kidney disease     History reviewed. No pertinent past surgical history.  There were no vitals filed for this visit.  Visit Diagnosis:  Weakness      Subjective Assessment - 12/24/15 1157    Subjective Patient is feeling better.   Currently in Pain? No/denies       TM walking 1.2 miles/ hour x 10 minutes Matrix scapula retraction, PNF D1, D2 x 15 x 3 UBE x 5 minutes Machine leg press 75 lbs x 20 x 3 Machine knee flex 2 plates x 15 x 2 Machine knee extension 1 plate x 15 x 2 Patient needs occasional verbal cueing to improve posture and cueing to correctly perform exercises slowly, holding at end of range .                          PT Education - 12/24/15 1158    Education provided Yes   Education Details HEP   Person(s) Educated Patient   Methods Explanation   Comprehension Verbalized understanding             PT Long Term Goals - 12/15/15 1656    PT LONG TERM GOAL #1   Title Patient will increase six minute walk test distance to >1000 for progression to community ambulator and improve gait ability   Time 4   Period Weeks   Status Partially Met   PT LONG TERM GOAL #2   Title Patient  will increase 10 meter walk test to >1.19ms as to improve gait speed for better community ambulation and to reduce fall risk   Time 4   Status Partially Met   PT LONG TERM GOAL #3   Title Patient will increase BLE gross strength to 4+/5 as to improve functional strength for independent gait, increased standing tolerance and increased ADL ability   Time 4   Status Partially Met               Plan - 12/24/15 1159    Clinical Impression Statement Min cuing needed to maintain posture while performing strengthening tasks   Pt will benefit from skilled therapeutic intervention in order to improve on the following deficits Abnormal gait;Decreased activity tolerance;Decreased endurance;Decreased strength;Difficulty walking   Rehab Potential Good   PT Frequency 2x / week   PT Duration 4 weeks   PT Treatment/Interventions Therapeutic exercise;Therapeutic activities;Gait training   PT Home Exercise Plan HEP        Problem List Patient Active Problem List   Diagnosis Date Noted  . Acute respiratory failure ([place] 11/30/2015  . CAP (community acquired pneumonia) 11/30/2015  . Syncope and  collapse 11/30/2015  . Diabetes mellitus without complication ([place]) 96/72/2773   Alanson Puls, PT, DPT [place], [place] S 12/24/2015, 12:00 PM  [place] MAIN [facility] SERVICES [address] [place], [place], [address] Phone: [phone]   Fax:  [phone]  Name: Vinaya Sancho MRN: [id number] Date of Birth: 04-11-1971

## 2015-12-29 ENCOUNTER — Ambulatory Visit: Payer: Medicare Other | Attending: Internal Medicine | Admitting: Physical Therapy

## 2015-12-29 ENCOUNTER — Encounter: Payer: Self-pay | Admitting: Physical Therapy

## 2015-12-29 DIAGNOSIS — R531 Weakness: Secondary | ICD-10-CM

## 2015-12-29 NOTE — Therapy (Signed)
[place] MAIN [facility] SERVICES [address] [place], [place], [address] Phone: [phone]   Fax:  [phone]  Physical Therapy Treatment  Patient Details  Name: Michele Bartlett MRN: [id number] Date of Birth: [birth year] Referring Provider: Dr. Posey Pronto   Encounter Date: 12/29/2015      PT End of Session - 12/29/15 1213    Visit Number 4   Number of Visits 9   Date for PT Re-Evaluation 01/19/16   PT Start Time 4174   PT Stop Time 1225   PT Time Calculation (min) 40 min   Equipment Utilized During Treatment Gait belt   Activity Tolerance Patient tolerated treatment well;Patient limited by fatigue      Past Medical History  Diagnosis Date  . Hypertension   . Diabetes mellitus without complication ([place])   . Kidney disease     History reviewed. No pertinent past surgical history.  There were no vitals filed for this visit.  Visit Diagnosis:  Weakness      Subjective Assessment - 12/29/15 1212    Subjective Patient is feeling better.       TM walking 1.2 miles/ hour x 10 minutes Matrix scapula retraction, PNF D1, D2 x 15 x 3 UBE x 5 minutes Machine leg press 75 lbs x 20 x 3 Machine knee flex 2 plates x 15 x 2 Machine knee extension 1 plate x 15 x 2 Leg press 90 lbs x 15 x 3 Patient needs occasional verbal cueing to improve posture and cueing to correctly perform exercises slowly, holding at end of range .                                                          PT Education - 12/29/15 1212    Education provided Yes   Education Details HEP   Person(s) Educated Patient   Methods Explanation   Comprehension Verbalized understanding             PT Long Term Goals - 12/15/15 1656    PT LONG TERM GOAL #1   Title Patient will increase six minute walk test distance to >1000 for progression to community ambulator and improve gait ability   Time 4   Period Weeks   Status  Partially Met   PT LONG TERM GOAL #2   Title Patient will increase 10 meter walk test to >1.96ms as to improve gait speed for better community ambulation and to reduce fall risk   Time 4   Status Partially Met   PT LONG TERM GOAL #3   Title Patient will increase BLE gross strength to 4+/5 as to improve functional strength for independent gait, increased standing tolerance and increased ADL ability   Time 4   Status Partially Met               Plan - 12/29/15 1213    Clinical Impression Statement Pt needed mod cueing when performing standing hip abd to stand up tall and keep toe pointed forward   Pt will benefit from skilled therapeutic intervention in order to improve on the following deficits Abnormal gait;Decreased activity tolerance;Decreased endurance;Decreased strength;Difficulty walking   Rehab Potential Good   PT Frequency 2x / week   PT Duration 4 weeks   PT Treatment/Interventions Therapeutic exercise;Therapeutic activities;Gait training  PT Home Exercise Plan HEP        Problem List Patient Active Problem List   Diagnosis Date Noted  . Acute respiratory failure ([place]) 11/30/2015  . CAP (community acquired pneumonia) 11/30/2015  . Syncope and collapse 11/30/2015  . Diabetes mellitus without complication ([place]) 88/26/6664  Alanson Puls, PT, DPT  [place] S 12/29/2015, 12:17 PM  [place] MAIN [facility] SERVICES [address] [place], [place], [address] Phone: [phone]   Fax:  [phone]  Name: Michele Bartlett MRN: [id number] Date of Birth: 04/10/1971

## 2015-12-31 ENCOUNTER — Ambulatory Visit: Payer: Medicare Other | Admitting: Physical Therapy

## 2015-12-31 ENCOUNTER — Encounter: Payer: Self-pay | Admitting: Physical Therapy

## 2015-12-31 DIAGNOSIS — R531 Weakness: Secondary | ICD-10-CM | POA: Diagnosis not present

## 2015-12-31 NOTE — Therapy (Signed)
[place] MAIN [facility] SERVICES [address] [place], [place], [address] Phone: [phone]   Fax:  [phone]  Physical Therapy Treatment  Patient Details  Name: Michele Bartlett MRN: [id number] Date of Birth: [birth year] Referring Provider: Dr. Posey Pronto   Encounter Date: 12/31/2015      PT End of Session - 12/31/15 1202    Visit Number 5   Number of Visits 9   Date for PT Re-Evaluation 01/19/16   PT Start Time 1140   PT Stop Time 1220   PT Time Calculation (min) 40 min   Equipment Utilized During Treatment Gait belt   Activity Tolerance Patient tolerated treatment well;Patient limited by fatigue      Past Medical History  Diagnosis Date  . Hypertension   . Diabetes mellitus without complication ([place])   . Kidney disease     History reviewed. No pertinent past surgical history.  There were no vitals filed for this visit.  Visit Diagnosis:  Weakness      Subjective Assessment - 12/31/15 1200    Subjective Patient is feeling better. She reports that she is able to go to her "school" now.    Currently in Pain? No/denies    Theraeputic exercise: TM walking x 10 mintues . 6 m/ hour U E x 5 minutes L4 Matrix scapula retraction 12. 5 lbs x 15 x 3 Matrix D1, D2 patterns 12.5 lbs RUE and LUE 15 x 3 Step ups on 6 inch stool  x 15 x 2  Standing hip flex/abd/extension with RTB x 15 Patient needs occasional verbal cueing to improve posture and cueing to correctly perform exercises slowly, holding at end of range to increase motor firing of desired muscle to encourage fatigue.                              PT Education - 12/31/15 1201    Education provided Yes   Education Details HEP    Person(s) Educated Patient   Methods Explanation   Comprehension Verbalized understanding             PT Long Term Goals - 12/15/15 1656    PT LONG TERM GOAL #1   Title Patient will increase six minute walk test distance to  >1000 for progression to community ambulator and improve gait ability   Time 4   Period Weeks   Status Partially Met   PT LONG TERM GOAL #2   Title Patient will increase 10 meter walk test to >1.72ms as to improve gait speed for better community ambulation and to reduce fall risk   Time 4   Status Partially Met   PT LONG TERM GOAL #3   Title Patient will increase BLE gross strength to 4+/5 as to improve functional strength for independent gait, increased standing tolerance and increased ADL ability   Time 4   Status Partially Met               Plan - 12/31/15 1202    Clinical Impression Statement Patient is able to perform strenthening exercises for UE and LE to progress back to her basline and reach goals.    Pt will benefit from skilled therapeutic intervention in order to improve on the following deficits Abnormal gait;Decreased activity tolerance;Decreased endurance;Decreased strength;Difficulty walking   Rehab Potential Good   PT Frequency 2x / week   PT Duration 4 weeks   PT Treatment/Interventions Therapeutic exercise;Therapeutic activities;Gait  training   PT Home Exercise Plan HEP        Problem List Patient Active Problem List   Diagnosis Date Noted  . Acute respiratory failure ([place]) 11/30/2015  . CAP (community acquired pneumonia) 11/30/2015  . Syncope and collapse 11/30/2015  . Diabetes mellitus without complication ([place]) 35/46/5681   Alanson Puls, PT, DPT [place], Minette [place] S 12/31/2015, 12:05 PM  [place] MAIN [facility] SERVICES [address] [place], [place], [address] Phone: [phone]   Fax:  [phone]  Name: Michele Bartlett MRN: [id number] Date of Birth: 26-Jan-1971

## 2016-01-05 ENCOUNTER — Encounter: Payer: Self-pay | Admitting: Physical Therapy

## 2016-01-05 ENCOUNTER — Ambulatory Visit: Payer: Medicare Other | Admitting: Physical Therapy

## 2016-01-05 VITALS — HR 89

## 2016-01-05 DIAGNOSIS — R531 Weakness: Secondary | ICD-10-CM

## 2016-01-05 NOTE — Therapy (Signed)
[place] MAIN [facility] SERVICES [address] [place], [place], [address] Phone: [phone]   Fax:  [phone]  Physical Therapy Treatment  Patient Details  Name: Michele Bartlett MRN: [id number] Date of Birth: [birth year] Referring Provider: Dr. Posey Pronto   Encounter Date: 01/05/2016      PT End of Session - 01/05/16 1250    Visit Number 6   Number of Visits 9   Date for PT Re-Evaluation 01/19/16   PT Start Time 1141   PT Stop Time 1226   PT Time Calculation (min) 45 min   Equipment Utilized During Treatment Gait belt   Activity Tolerance Patient tolerated treatment well;Patient limited by fatigue   Behavior During Therapy [facility] for tasks assessed/performed      Past Medical History  Diagnosis Date  . Hypertension   . Diabetes mellitus without complication ([place])   . Kidney disease     History reviewed. No pertinent past surgical history.  Filed Vitals:   01/05/16 1252  Pulse: 89  SpO2: 96%        Subjective Assessment - 01/05/16 1141    Subjective Pt reports feeling better with less fatigue since last visit.   Patient is accompained by: Family member   Currently in Pain? No/denies      Warm up: TM x4 min on 1.0 speed (unbilled)  Therex: Standing hip SLR, abd, and ext with GTB and hip flexion with 2# x10 each Mini squats x 10 Heel raises x 15 Leg press 75# 2x15 Cues for proper technique of exercises including positioning and postural correction.  Standing shoulder flexion with 4# and abd with 2# 2x10 each Matrix B UE rows and lat pull downs 12.5# 2x10 each  Cues for scapular retraction and decreased shoulder shrug during exercises for proper technique.  Provided and reviewed HEP for LE strengthening and endurance training from www.hep2go.com, including standing hip marching, abd, mini squats, heel raises and walking program for daily total of 30 minutes per day. Pt verbalized and demonstrated understanding of HEP handout  provided.                           PT Education - 01/05/16 1249    Education provided Yes   Education Details HEP handout, see objective section for details   Person(s) Educated Patient   Methods Explanation;Demonstration   Comprehension Verbalized understanding;Returned demonstration             PT Long Term Goals - 12/15/15 1656    PT LONG TERM GOAL #1   Title Patient will increase six minute walk test distance to >1000 for progression to community ambulator and improve gait ability   Time 4   Period Weeks   Status Partially Met   PT LONG TERM GOAL #2   Title Patient will increase 10 meter walk test to >1.88ms as to improve gait speed for better community ambulation and to reduce fall risk   Time 4   Status Partially Met   PT LONG TERM GOAL #3   Title Patient will increase BLE gross strength to 4+/5 as to improve functional strength for independent gait, increased standing tolerance and increased ADL ability   Time 4   Status Partially Met               Plan - 01/05/16 1253    Clinical Impression Statement Pt demonstrated some SOB during activity with SpO2 at 96% room air and HR  89. She tolerated treatment well and required occasional therapeutic rest breaks for energy conservation. Pt had no c/o pain with new exercises. Overall endurance gradually improving.   Rehab Potential Good   PT Frequency 2x / week   PT Duration 4 weeks   PT Treatment/Interventions Therapeutic exercise;Therapeutic activities;Gait training   PT Next Visit Plan UE HEP, review goals   PT Home Exercise Plan HEP      Patient will benefit from skilled therapeutic intervention in order to improve the following deficits and impairments:  Abnormal gait, Decreased activity tolerance, Decreased endurance, Decreased strength, Difficulty walking  Visit Diagnosis: Weakness     Problem List Patient Active Problem List   Diagnosis Date Noted  . Acute respiratory failure  ([place]) 11/30/2015  . CAP (community acquired pneumonia) 11/30/2015  . Syncope and collapse 11/30/2015  . Diabetes mellitus without complication ([place]) 89/84/2103    Michele Bartlett Shiela Mayer, PT, DPT  01/05/2016, 12:59 PM [place] MAIN [facility] SERVICES [address] [place], [place], [address] Phone: [phone]   Fax:  [phone]  Name: Michele Bartlett MRN: [id number] Date of Birth: 1971/05/24

## 2016-01-07 ENCOUNTER — Ambulatory Visit: Payer: Medicare Other | Admitting: Physical Therapy

## 2016-01-07 ENCOUNTER — Encounter: Payer: Self-pay | Admitting: Physical Therapy

## 2016-01-07 DIAGNOSIS — R531 Weakness: Secondary | ICD-10-CM | POA: Diagnosis not present

## 2016-01-07 NOTE — Therapy (Signed)
[place] MAIN [facility] SERVICES [address] [place], [place], [address] Phone: [phone]   Fax:  [phone]  Physical Therapy Treatment/Discharge Summary  Patient Details  Name: Michele Bartlett MRN: [id number] Date of Birth: [birth year] Referring Provider: Dr. Posey Pronto   Encounter Date: 01/07/2016      PT End of Session - 01/07/16 1558    Visit Number 8   Number of Visits 9   Date for PT Re-Evaluation 01/19/16   PT Start Time 1148   PT Stop Time 1233   PT Time Calculation (min) 45 min   Equipment Utilized During Treatment Gait belt   Activity Tolerance Patient tolerated treatment well;Patient limited by fatigue   Behavior During Therapy [facility] for tasks assessed/performed      Past Medical History  Diagnosis Date  . Hypertension   . Diabetes mellitus without complication ([place])   . Kidney disease     History reviewed. No pertinent past surgical history.  There were no vitals filed for this visit.      Subjective Assessment - 01/07/16 1148    Subjective Pt reports she is feeling good and has not had as much fatigue.   Patient is accompained by: Family member   Currently in Pain? No/denies       Warm up: Nustep L3 x 5 min (unbilled)  5x STS - 13.5 sec 10 meter walk - 0.72 m/s TUG - 11 sec 6 min walk - 782 ft LE strength - 4/5  B UE: mid rows, low rows, and ER ("W") with RTB x10 each. Cues for proper technique and posture during exercises including scapular retraction.                              PT Education - 01/07/16 1558    Education provided Yes   Education Details reviewed UE HEP   Person(s) Educated Patient;Parent(s)   Methods Explanation;Demonstration   Comprehension Verbalized understanding;Returned demonstration             PT Long Term Goals - 01/07/16 1656    PT LONG TERM GOAL #1   Title Patient will increase six minute walk test distance to >1000 for progression to community  ambulator and improve gait ability   Time 4   Period Weeks   Status Partially Met   PT LONG TERM GOAL #2   Title Patient will increase 10 meter walk test to >1.33ms as to improve gait speed for better community ambulation and to reduce fall risk   Time 4   Status Partially Met   PT LONG TERM GOAL #3   Title Patient will increase BLE gross strength to 4+/5 as to improve functional strength for independent gait, increased standing tolerance and increased ADL ability   Time 4   Status Partially Met               Plan - 01/07/16 1653    Clinical Impression Statement Pt demonstrated improvements in outcome measures, but continues to have deficits of strength, endurance and functional mobility. She continues to have partially met goals. Further skilled PT was discussed with pt and family, but they felt as though they could continue HEP independently and chose to discontinue skilled PT. Pt and family stated that pt will also continue walking on TM for exercise as well.    Rehab Potential Good   PT Frequency 2x / week   PT Duration 4 weeks  PT Treatment/Interventions Therapeutic exercise;Therapeutic activities;Gait training   PT Next Visit Plan --   PT Home Exercise Plan LE and UE strengthening exercises      Patient will benefit from skilled therapeutic intervention in order to improve the following deficits and impairments:  Abnormal gait, Decreased activity tolerance, Decreased endurance, Decreased strength, Difficulty walking  Visit Diagnosis: Weakness       G-Codes - 01/19/16 1659    Functional Assessment Tool Used 6 mw, 10 mw, 5 X Sit to stand   Functional Limitation Mobility: Walking and moving around   Mobility: Walking and Moving Around Goal Status [phone]) At least 20 percent but less than 40 percent impaired, limited or restricted   Mobility: Walking and Moving Around Discharge Status [phone]) At least 20 percent but less than 40 percent impaired, limited or restricted       Problem List Patient Active Problem List   Diagnosis Date Noted  . Acute respiratory failure ([place]) 11/30/2015  . CAP (community acquired pneumonia) 11/30/2015  . Syncope and collapse 11/30/2015  . Diabetes mellitus without complication ([place]) 72/05/4708    Leimomi Zervas Shiela Mayer, PT, DPT  Jan 19, 2016, 5:12 PM [place] MAIN [facility] SERVICES [address] [place], [place], [address] Phone: [phone]   Fax:  [phone]  Name: Michele Bartlett MRN: [id number] Date of Birth: 05/04/1971

## 2016-01-07 NOTE — Patient Instructions (Addendum)
Provided and reviewed HEP for scapular strengthening including mid and low rows, and B shoulder ER from www.hep2go.com

## 2016-11-03 IMAGING — CR DG CHEST 2V
2 series · 3 of 3 positions shown · non-contrast
Comparison: 03/10/2012

CLINICAL DATA: state she has just finished a Azithromycin for upper
respiratory infection and since taking has had c/o of tremors and
general malaise with fever. Pt does not stand.

EXAM:
CHEST - 2 VIEW

[Series 2: chest lat · 0.14mm/px · 2 of 2 slices shown]
[im 1/2]
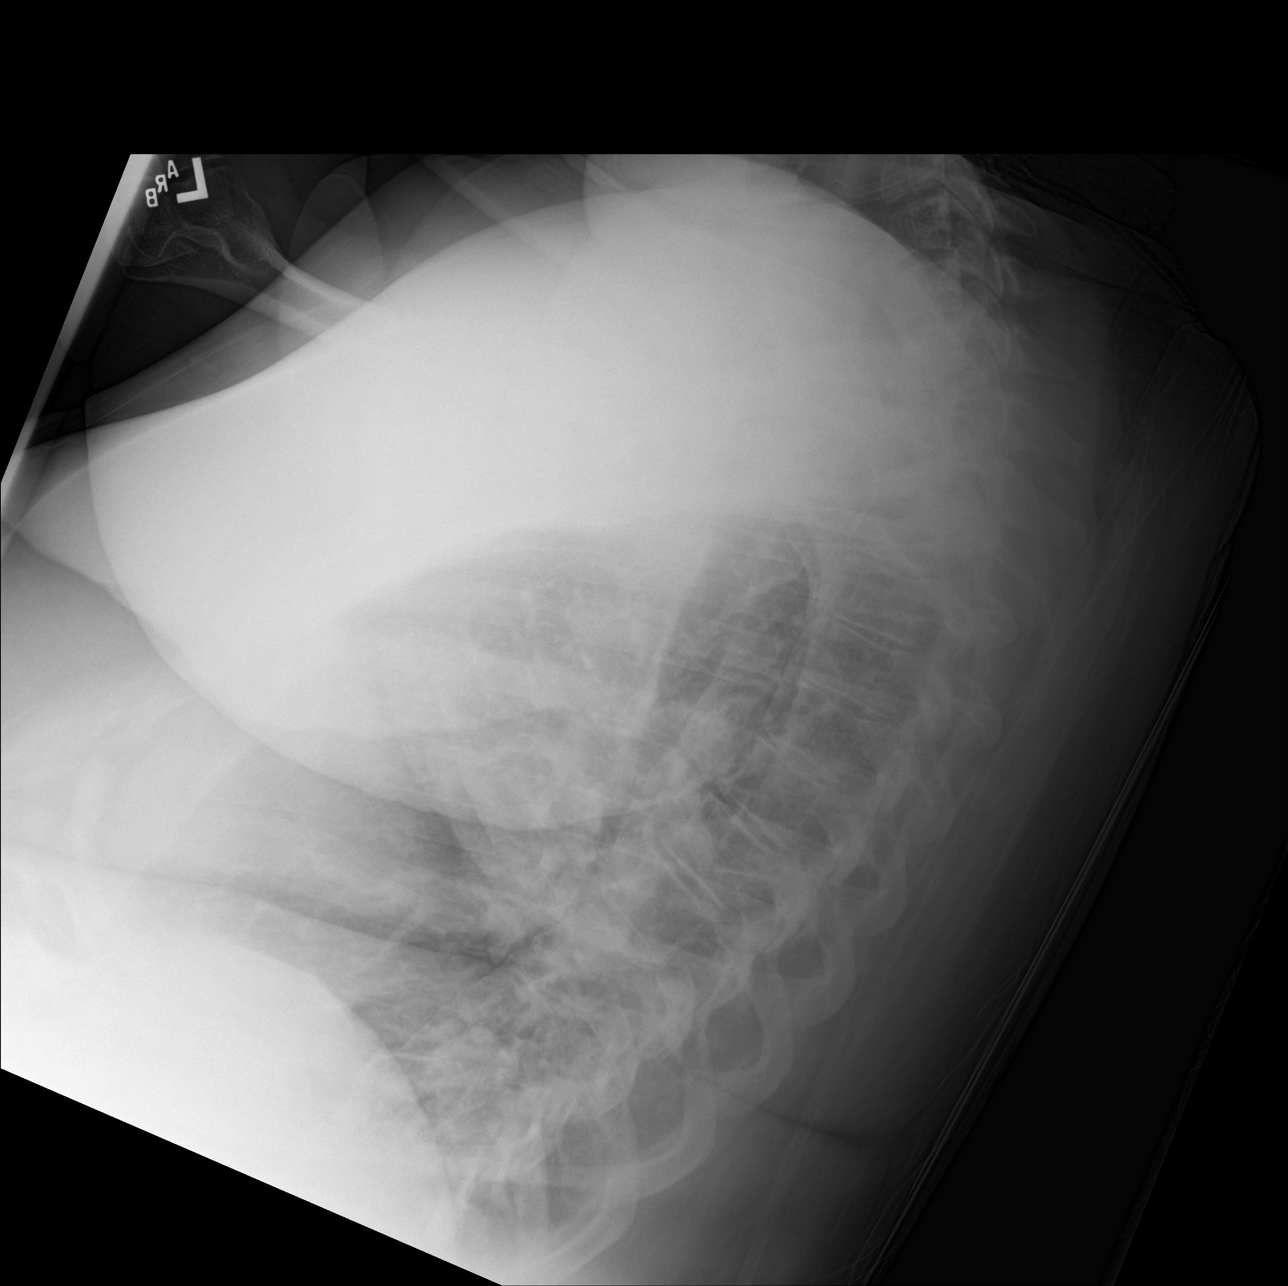
[im 2/2]
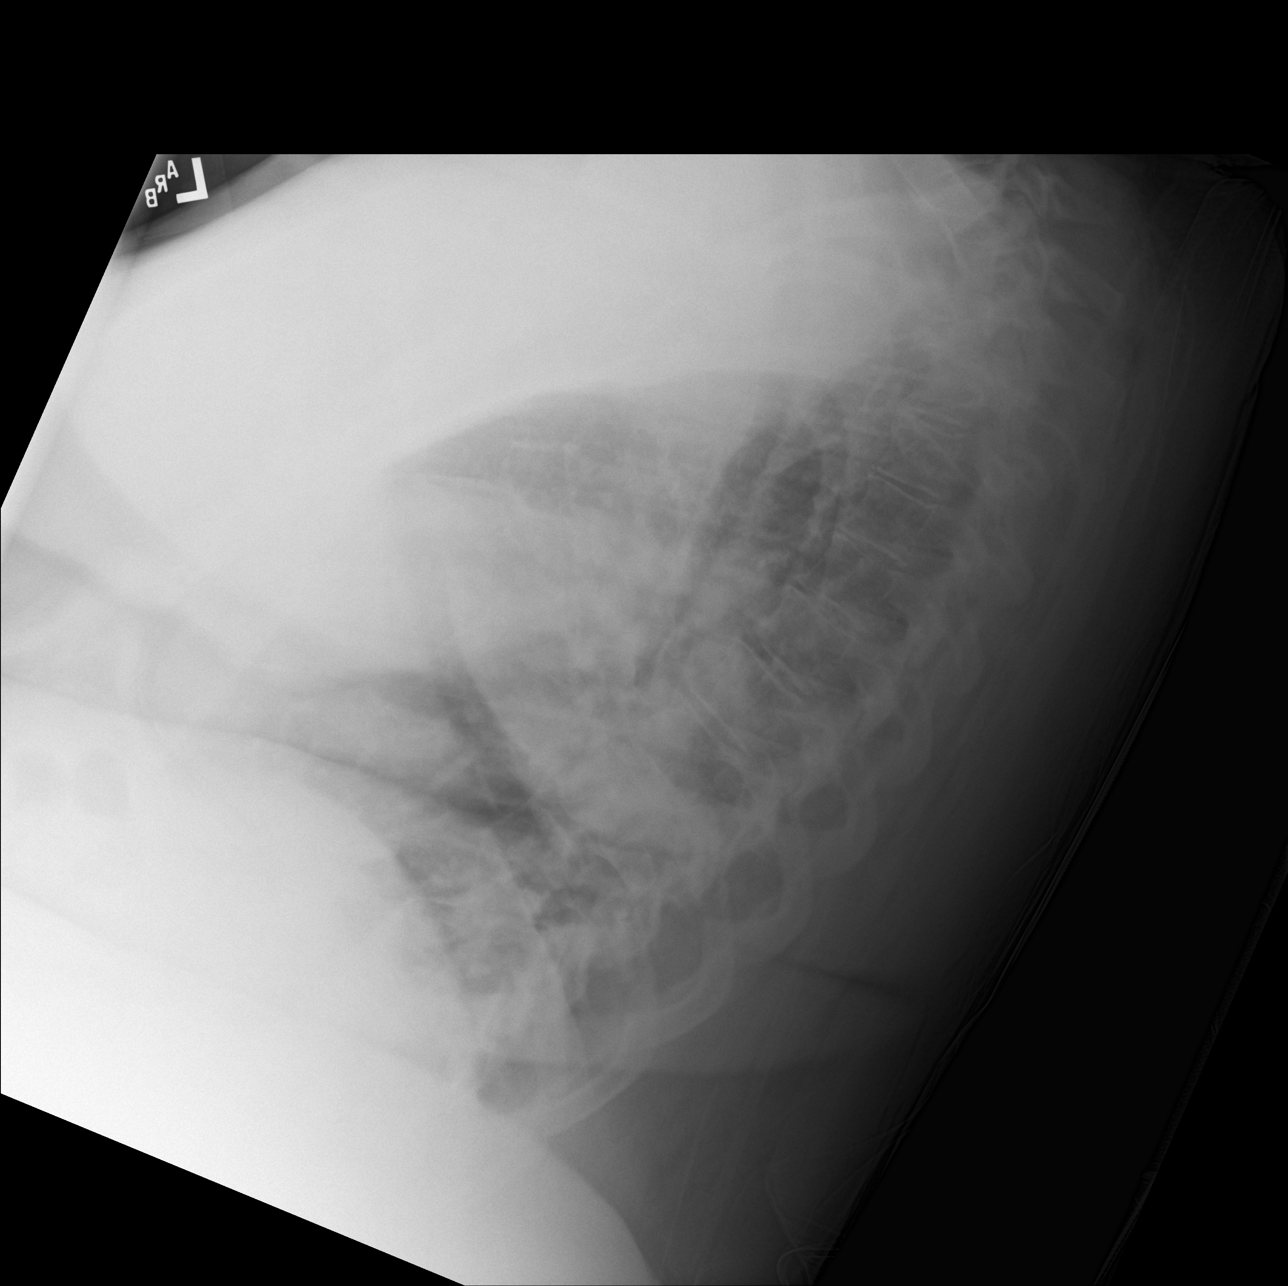

[chest ap]
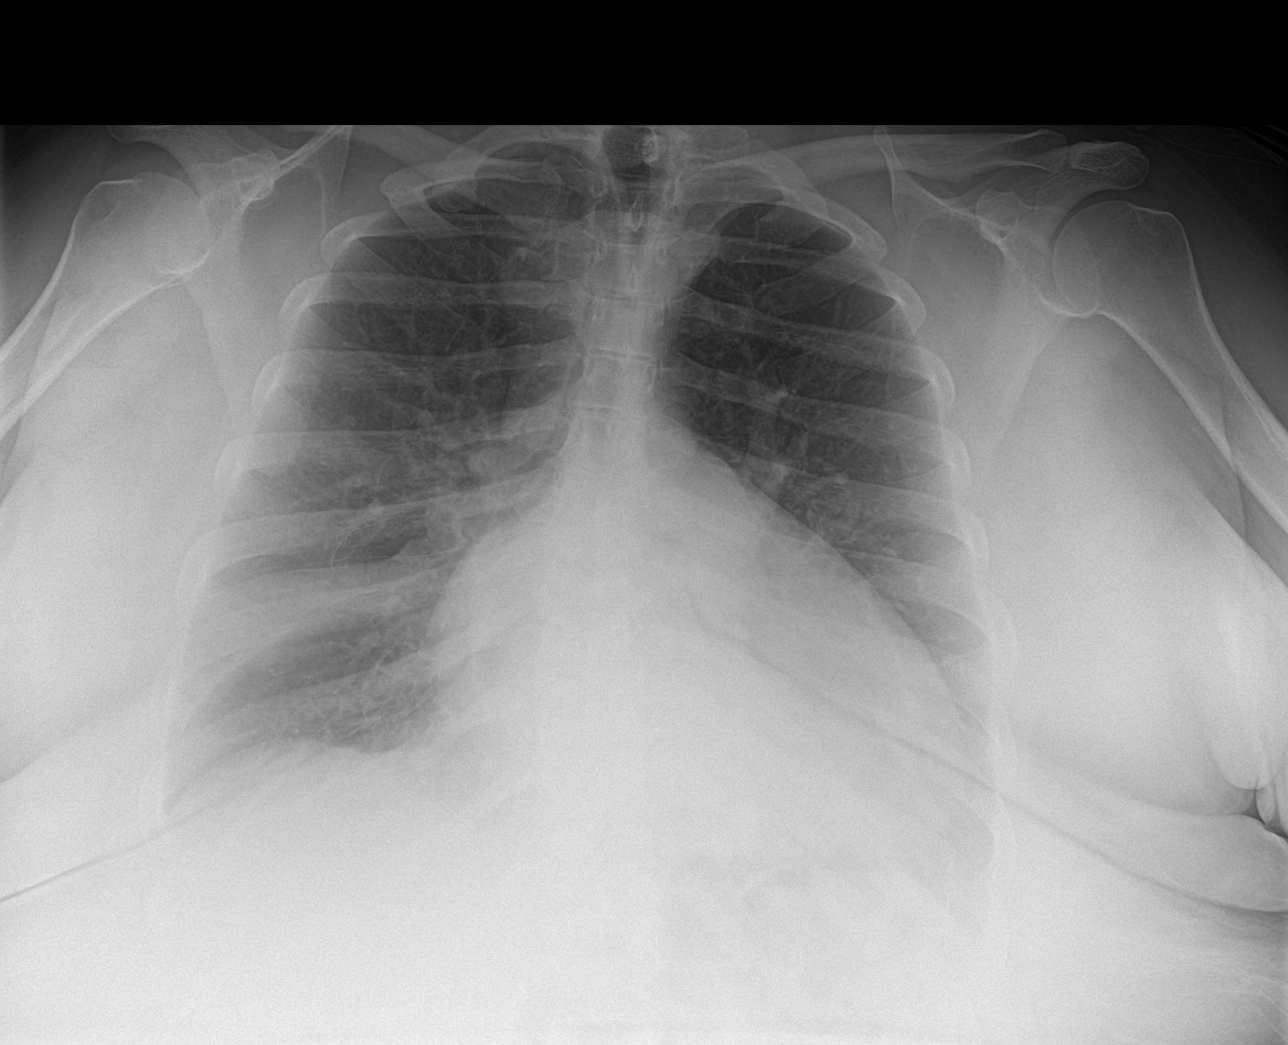

[3 of 3 positions shown; findings below may reference images not displayed]

FINDINGS: Mild cardiomegaly. New right middle lobe consolidation. Progressive
left infrahilar consolidation/atelectasis.
No effusion.  No pneumothorax.
Visualized skeletal structures are unremarkable.
IMPRESSION: 1. Worsening right middle lobe and left infrahilar
consolidation/atelectasis.
2. Stable mild cardiomegaly.

## 2017-09-09 ENCOUNTER — Ambulatory Visit (INDEPENDENT_AMBULATORY_CARE_PROVIDER_SITE_OTHER): Payer: Medicare Other | Admitting: Nurse Practitioner

## 2017-09-09 ENCOUNTER — Encounter: Payer: Self-pay | Admitting: Nurse Practitioner

## 2017-09-09 VITALS — BP 142/88 | HR 84 | Resp 16 | Ht 62.0 in | Wt 260.0 lb

## 2017-09-09 DIAGNOSIS — N183 Chronic kidney disease, stage 3 unspecified: Secondary | ICD-10-CM

## 2017-09-09 DIAGNOSIS — E1165 Type 2 diabetes mellitus with hyperglycemia: Secondary | ICD-10-CM | POA: Diagnosis not present

## 2017-09-09 DIAGNOSIS — I10 Essential (primary) hypertension: Secondary | ICD-10-CM | POA: Diagnosis not present

## 2017-09-09 DIAGNOSIS — Z0001 Encounter for general adult medical examination with abnormal findings: Secondary | ICD-10-CM

## 2017-09-09 DIAGNOSIS — Z0000 Encounter for general adult medical examination without abnormal findings: Secondary | ICD-10-CM | POA: Diagnosis not present

## 2017-09-09 LAB — POCT GLYCOSYLATED HEMOGLOBIN (HGB A1C): HEMOGLOBIN A1C: 5.4

## 2017-09-09 MED ORDER — PHENTERMINE HCL 37.5 MG PO TABS: 37.5000 mg | ORAL_TABLET | Freq: Every day | ORAL | 0 refills | Status: DC

## 2017-09-09 NOTE — Progress Notes (Signed)
Subjective:     Patient ID: Michele Bartlett, female   DOB: 07/25/1971, 46 y.o.   MRN: [id number]     The patient presents for annual wellness visit today. She has no concerns or complaints. States that blood sugars are doinig well. Asthma and allergies are well controlled. She has lost 5 pounds since her most recent visit. She has been following a reduced calorie diet and is exercising more frequently. She mentions that she would like to try appetite suppressants again. Did well on these in the past.  The patient sees nephrology on regular visit. Kidney functions are slightly worse recently. Lasix has been changed to every other day.      Review of Systems  Constitutional: Negative.  Negative for activity change and fatigue.  HENT: Negative.  Negative for sinus pressure and sore throat.   Respiratory: Negative.  Negative for shortness of breath and wheezing.   Cardiovascular: Positive for leg swelling. Negative for chest pain and palpitations.  Gastrointestinal: Negative.  Negative for abdominal pain, constipation, diarrhea, nausea and vomiting.  Endocrine: Negative.        The patient states that her blood sugars continue t do very well.   Genitourinary: Negative.   Musculoskeletal: Negative.   Skin: Negative.   Allergic/Immunologic: Negative.   Neurological: Negative.   Hematological: Negative.   Psychiatric/Behavioral: Negative.        Objective:   Physical Exam  Constitutional: She is oriented to person, place, and time. She appears well-developed and well-nourished.  HENT:  Head: Normocephalic.  Neck: Normal range of motion. Neck supple.  Cardiovascular: Normal rate, regular rhythm, S1 normal, S2 normal and intact distal pulses.  There is 1+ pitting edema in bilateral lower extremities. It is non painful.   Pulmonary/Chest: Effort normal and breath sounds normal. No respiratory distress. She has no wheezes. She exhibits no tenderness.  Abdominal: Soft. Bowel sounds are normal.  There is no tenderness.  Musculoskeletal: Normal range of motion.  Neurological: She is alert and oriented to person, place, and time.  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 She is at her neurological baseline.   Skin: Skin is warm and dry.  Psychiatric: She has a normal mood and affect. Her behavior is normal.  Nursing note and vitals reviewed.      Assessment:     Encounter for health maintenance examination with abnormal findings - Plan: Urinalysis, Routine w reflex microscopic  Uncontrolled type 2 diabetes mellitus with hyperglycemia ([place]) - Plan: POCT HgB A1C  Morbid obesity ([place]) - Plan: phentermine (ADIPEX-P) 37.5 MG tablet  Essential hypertension  Chronic kidney disease, stage 3 (moderate) (HCC)   Plan:     1. Annual wellness visit today 2. DM2, uncontrolled - hgbA1c 5.4 today. Continue Victoza as prescribed.  3. Chronic kidney disease - continue regular visits with nephrology as scheduled.  4. Morbid obesity - add phentermine 37.5mg  tablets. Advised her to start taking 1/2 tablet daily. Limit calorie intakee to 1500 calories per day and participate in regular exercise.  5. Essential hypertension - continue bp medication as prescribed   Follow up 4-5 weeks.

## 2017-09-10 LAB — URINALYSIS, ROUTINE W REFLEX MICROSCOPIC
BILIRUBIN UA: NEGATIVE
GLUCOSE, UA: NEGATIVE
Ketones, UA: NEGATIVE
Nitrite, UA: NEGATIVE
Specific Gravity, UA: 1.014 (ref 1.005–1.030)
Urobilinogen, Ur: 0.2 mg/dL (ref 0.2–1.0)
pH, UA: 7 (ref 5.0–7.5)

## 2017-09-10 LAB — MICROSCOPIC EXAMINATION: CASTS: NONE SEEN /LPF

## 2017-09-11 ENCOUNTER — Encounter: Payer: Self-pay | Admitting: Nurse Practitioner

## 2017-09-12 MED ORDER — PHENTERMINE HCL 37.5 MG PO TABS: 37.5000 mg | ORAL_TABLET | Freq: Every day | ORAL | 0 refills | Status: DC

## 2017-09-12 NOTE — Addendum Note (Signed)
Addended by: Leretha Pol on: 09/12/2017 09:08 AM   Modules accepted: Orders

## 2017-09-12 NOTE — Addendum Note (Signed)
Addended by: Leretha Pol on: 09/12/2017 09:05 AM   Modules accepted: Orders, Level of Service

## 2017-09-26 ENCOUNTER — Other Ambulatory Visit: Payer: Self-pay | Admitting: Internal Medicine

## 2017-10-14 ENCOUNTER — Encounter: Payer: Self-pay | Admitting: Nurse Practitioner

## 2017-10-14 ENCOUNTER — Ambulatory Visit (INDEPENDENT_AMBULATORY_CARE_PROVIDER_SITE_OTHER): Payer: Medicare Other | Admitting: Internal Medicine

## 2017-10-14 ENCOUNTER — Other Ambulatory Visit: Payer: Self-pay

## 2017-10-14 ENCOUNTER — Ambulatory Visit: Payer: Medicare Other | Admitting: Nurse Practitioner

## 2017-10-14 VITALS — BP 130/80 | HR 78 | Resp 16 | Ht 62.0 in | Wt 262.4 lb

## 2017-10-14 DIAGNOSIS — R0902 Hypoxemia: Secondary | ICD-10-CM | POA: Diagnosis not present

## 2017-10-14 DIAGNOSIS — J209 Acute bronchitis, unspecified: Secondary | ICD-10-CM

## 2017-10-14 DIAGNOSIS — N183 Chronic kidney disease, stage 3 unspecified: Secondary | ICD-10-CM

## 2017-10-14 DIAGNOSIS — N049 Nephrotic syndrome with unspecified morphologic changes: Secondary | ICD-10-CM

## 2017-10-14 MED ORDER — PHENTERMINE HCL 37.5 MG PO TABS: 37.5000 mg | ORAL_TABLET | Freq: Every day | ORAL | 0 refills | Status: DC

## 2017-10-14 NOTE — Progress Notes (Signed)
[facility] [place], [place] [address]  Internal MEDICINE  Office Visit Note  Patient Name: Michele Bartlett  [id number]  [id number]  Date of Service: 10/14/2017   HPI  Pt is here for routine follow up.  C/o weight gain, has nephrotic syndrome with CKD.  She has been not sleeping well at night, does use O2, has been having cough and sinus congestion.    Current Medication: Outpatient Encounter Medications as of 10/14/2017  Medication Sig  . albuterol (PROAIR HFA) 108 (90 Base) MCG/ACT inhaler Inhale 2 puffs into the lungs every 6 (six) hours as needed. For  Wheezing.  Marland Kitchen albuterol (PROVENTIL) (2.5 MG/3ML) 0.083% nebulizer solution Take 2.5 mg by nebulization every 6 (six) hours as needed for wheezing or shortness of breath.  . allopurinol (ZYLOPRIM) 100 MG tablet Take 100 mg by mouth daily.  . benazepril (LOTENSIN) 40 MG tablet Take 40 mg by mouth daily.  . colchicine 0.6 MG tablet Take 0.6 mg by mouth 2 (two) times daily.  . fluticasone (FLONASE) 50 MCG/ACT nasal spray Place 1 spray into both nostrils 2 (two) times daily as needed for allergies or rhinitis. Reported on 11/30/2015  . furosemide (LASIX) 40 MG tablet Take 40 mg by mouth every other day.  . levocetirizine (XYZAL) 5 MG tablet Take 5 mg by mouth daily.  . metoprolol tartrate (LOPRESSOR) 25 MG tablet TAKE 1 TABLET TWICE A DAY  . mometasone-formoterol (DULERA) 100-5 MCG/ACT AERO Inhale 2 puffs into the lungs every 12 (twelve) hours as needed for wheezing or shortness of breath.  Marland Kitchen omeprazole (PRILOSEC) 20 MG capsule Take 20 mg by mouth daily.  . OXYGEN Inhale 2 L/min into the lungs at bedtime.  . phentermine (ADIPEX-P) 37.5 MG tablet Take 1 tablet (37.5 mg total) by mouth daily before breakfast.  . Spacer/Aero-Holding Chambers (AEROCHAMBER PLUS WITH MASK) inhaler 1 each by Other route once. Use as instructed  . VICTOZA 18 MG/3ML SOPN Inject 1.2 mg into the skin daily.  Marland Kitchen azithromycin (ZITHROMAX) 250 MG  tablet Take 250-500 mg by mouth See admin instructions. Take 2 tablets (500mg ) by mouth now, then take 1 tablet by mouth once a day x 4 days.  . Dextromethorphan-Guaifenesin (MUCINEX DM MAXIMUM STRENGTH) 60-1200 MG TB12 Take 1 tablet by mouth every 12 (twelve) hours.  Marland Kitchen levofloxacin (LEVAQUIN) 750 MG tablet Take 1 tablet (750 mg total) by mouth every other day. (Patient not taking: Reported on 09/09/2017)   No facility-administered encounter medications on file as of 10/14/2017.     Surgical History: No past surgical history on file.  Medical History: Past Medical History:  Diagnosis Date  . Diabetes mellitus without complication ([place])   . Hypertension   . Kidney disease     Family History: Family History  Problem Relation Age of Onset  . Diabetes Mellitus II Mother   . CAD Father   . Hypertension Father     Social History   Socioeconomic History  . Marital status: Single    Spouse name: Not on file  . Number of children: Not on file  . Years of education: Not on file  . Highest education level: Not on file  Social Needs  . Financial resource strain: Not on file  . Food insecurity - worry: Not on file  . Food insecurity - inability: Not on file  . Transportation needs - medical: Not on file  . Transportation needs - non-medical: Not on file  Occupational History  . Not on  file  Tobacco Use  . Smoking status: Never Smoker  . Smokeless tobacco: Never Used  Substance and Sexual Activity  . Alcohol use: No  . Drug use: Not on file  . Sexual activity: Not on file  Other Topics Concern  . Not on file  Social History Narrative  . Not on file    Review of Systems  Constitutional: Negative for chills, diaphoresis and fatigue.  HENT: Positive for congestion and postnasal drip. Negative for ear pain and sinus pressure.   Eyes: Negative for photophobia, discharge, redness, itching and visual disturbance.  Respiratory: Negative for cough, shortness of breath and wheezing.    Cardiovascular: Negative for chest pain, palpitations and leg swelling.  Gastrointestinal: Negative for abdominal pain, constipation, diarrhea, nausea and vomiting.  Genitourinary: Negative for dysuria and flank pain.  Musculoskeletal: Negative for arthralgias, back pain, gait problem and neck pain.  Skin: Negative for color change.  Allergic/Immunologic: Negative for environmental allergies and food allergies.  Neurological: Positive for dizziness. Negative for headaches.  Hematological: Does not bruise/bleed easily.  Psychiatric/Behavioral: Negative for agitation, behavioral problems (depression) and hallucinations.    Vital Signs: BP 130/80   Pulse 78   Resp 16   Ht 5\' 2"  (1.575 m)   Wt 262 lb 6.4 oz (119 kg)   SpO2 94%   BMI 47.99 kg/m    Physical Exam  Constitutional: She is oriented to person, place, and time. She appears well-developed and well-nourished. No distress.  HENT:  Head: Normocephalic and atraumatic.  Mouth/Throat: Oropharynx is clear and moist. No oropharyngeal exudate.  Eyes: EOM are normal. Pupils are equal, round, and reactive to light.  Neck: Normal range of motion. Neck supple. No JVD present. No tracheal deviation present. No thyromegaly present.  Cardiovascular: Normal rate, regular rhythm and normal heart sounds. Exam reveals no gallop and no friction rub.  No murmur heard. Pulmonary/Chest: Effort normal. No respiratory distress. She has no wheezes. She has no rales. She exhibits no tenderness.  Abdominal: Soft. Bowel sounds are normal.  Musculoskeletal: Normal range of motion.  Lymphadenopathy:    She has no cervical adenopathy.  Neurological: She is alert and oriented to person, place, and time. No cranial nerve deficit.  Skin: Skin is warm and dry. She is not diaphoretic.  Psychiatric: She has a normal mood and affect. Her behavior is normal. Judgment and thought content normal.   Assessment/Plan: 1. Morbid obesity (HCC) - phentermine  (ADIPEX-P) 37.5 MG tablet; Take 1 tablet (37.5 mg total) by mouth daily before breakfast. (Patient not taking: Reported on 11/08/2017)  Dispense: 30 tablet; Refill: 0 - TSH + free T4  2. Hypoxia - Pulse oximetry, overnight; Future  3. Chronic kidney disease, stage 3 (moderate) (HCC) - Comprehensive metabolic panel, follow up with nephrology  4. Nephrotic syndrome - Per nephrology   5. Acute bronchitis, unspecified organism - azithromycin (ZITHROMAX) 250 MG tablet; Take one tab po qd for 7 days  Dispense: 7 tablet; Refill: 0  General Counseling: Taima verbalizes understanding of the findings of todays visit and agrees with plan of treatment. I have discussed any further diagnostic evaluation that may be needed or ordered today. We also reviewed her medications today. she has been encouraged to call the office with any questions or concerns that should arise related to todays visit.    Counseling: Hypertension Counseling:   The following hypertensive lifestyle modification were recommended and discussed:  1. Limiting alcohol intake to less than 1 oz/day of ethanol:(24 oz of beer  or 8 oz of wine or 2 oz of 100-proof whiskey). 2. Take baby ASA 81 mg daily. 3. Importance of regular aerobic exercise and losing weight. 4. Reduce dietary saturated fat and cholesterol intake for overall cardiovascular health. 5. Maintaining adequate dietary potassium, calcium, and magnesium intake. 6. Regular monitoring of the blood pressure. 7. Reduce sodium intake to less than 100 mmol/day (less than 2.3 gm of sodium or less than 6 gm of sodium choride)   Time spent:30 Minutes  Dr Lavera Guise Internal medicine

## 2017-10-15 LAB — COMPREHENSIVE METABOLIC PANEL
A/G RATIO: 0.9 — AB (ref 1.2–2.2)
ALT: 10 IU/L (ref 0–32)
AST: 15 IU/L (ref 0–40)
Albumin: 3.2 g/dL — ABNORMAL LOW (ref 3.5–5.5)
Alkaline Phosphatase: 67 IU/L (ref 39–117)
BUN/Creatinine Ratio: 5 — ABNORMAL LOW (ref 9–23)
BUN: 16 mg/dL (ref 6–24)
Bilirubin Total: 0.9 mg/dL (ref 0.0–1.2)
CALCIUM: 8.9 mg/dL (ref 8.7–10.2)
CO2: 24 mmol/L (ref 20–29)
CREATININE: 3.11 mg/dL — AB (ref 0.57–1.00)
Chloride: 103 mmol/L (ref 96–106)
GFR, EST AFRICAN AMERICAN: 20 mL/min/{1.73_m2} — AB (ref >59)
GFR, EST NON AFRICAN AMERICAN: 17 mL/min/{1.73_m2} — AB (ref >59)
GLUCOSE: 149 mg/dL — AB (ref 65–99)
Globulin, Total: 3.6 g/dL (ref 1.5–4.5)
POTASSIUM: 4.4 mmol/L (ref 3.5–5.2)
Sodium: 142 mmol/L (ref 134–144)
TOTAL PROTEIN: 6.8 g/dL (ref 6.0–8.5)

## 2017-10-15 LAB — TSH+FREE T4
FREE T4: 1.3 ng/dL (ref 0.82–1.77)
TSH: 3.49 u[IU]/mL (ref 0.450–4.500)

## 2017-10-25 ENCOUNTER — Ambulatory Visit (INDEPENDENT_AMBULATORY_CARE_PROVIDER_SITE_OTHER): Payer: Medicare Other | Admitting: Internal Medicine

## 2017-10-25 ENCOUNTER — Encounter: Payer: Self-pay | Admitting: Internal Medicine

## 2017-10-25 VITALS — BP 148/88 | HR 98 | Resp 16 | Ht 62.0 in | Wt 264.8 lb

## 2017-10-25 DIAGNOSIS — J4520 Mild intermittent asthma, uncomplicated: Secondary | ICD-10-CM

## 2017-10-25 DIAGNOSIS — G4733 Obstructive sleep apnea (adult) (pediatric): Secondary | ICD-10-CM | POA: Diagnosis not present

## 2017-10-25 DIAGNOSIS — J301 Allergic rhinitis due to pollen: Secondary | ICD-10-CM | POA: Diagnosis not present

## 2017-10-25 NOTE — Patient Instructions (Signed)

## 2017-10-25 NOTE — Progress Notes (Signed)
[facility] [place], [place] [address]  Pulmonary Sleep Medicine  Office Visit Note  Patient Name: Michele Bartlett DOB: 1971-09-05 MRN [id number]  Date of Service: 10/25/2017  Complaints/HPI:  Patient has history of sleep apnea allergic rhinitis asthma.  Generally doing well noted missions to the hospital.  She states on occasional shortness breath and cough.  She has not had recent pulmonary function tests.  She does not have any admissions to the hospital.  She is not on CPAP at this time.  ROS  General: (-) fever, (-) chills, (-) night sweats, (-) weakness Skin: (-) rashes, (-) itching,. Eyes: (-) visual changes, (-) redness, (-) itching. Nose and Sinuses: (-) nasal stuffiness or itchiness, (-) postnasal drip, (-) nosebleeds, (-) sinus trouble. Mouth and Throat: (-) sore throat, (-) hoarseness. Neck: (-) swollen glands, (-) enlarged thyroid, (-) neck pain. Respiratory: - cough, (-) bloody sputum, - shortness of breath, - wheezing. Cardiovascular: - ankle swelling, (-) chest pain. Lymphatic: (-) lymph node enlargement. Neurologic: (-) numbness, (-) tingling. Psychiatric: (-) anxiety, (-) depression   Current Medication: Outpatient Encounter Medications as of 10/25/2017  Medication Sig  . albuterol (PROAIR HFA) 108 (90 Base) MCG/ACT inhaler Inhale 2 puffs into the lungs every 6 (six) hours as needed. For  Wheezing.  Marland Kitchen albuterol (PROVENTIL) (2.5 MG/3ML) 0.083% nebulizer solution Take 2.5 mg by nebulization every 6 (six) hours as needed for wheezing or shortness of breath.  . allopurinol (ZYLOPRIM) 100 MG tablet Take 100 mg by mouth daily.  Marland Kitchen azithromycin (ZITHROMAX) 250 MG tablet Take 250-500 mg by mouth See admin instructions. Take 2 tablets (500mg ) by mouth now, then take 1 tablet by mouth once a day x 4 days.  . benazepril (LOTENSIN) 40 MG tablet Take 40 mg by mouth daily.  . colchicine 0.6 MG tablet Take 0.6 mg by mouth 2 (two) times daily.  Marland Kitchen  Dextromethorphan-Guaifenesin (MUCINEX DM MAXIMUM STRENGTH) 60-1200 MG TB12 Take 1 tablet by mouth every 12 (twelve) hours.  . fluticasone (FLONASE) 50 MCG/ACT nasal spray Place 1 spray into both nostrils 2 (two) times daily as needed for allergies or rhinitis. Reported on 11/30/2015  . furosemide (LASIX) 40 MG tablet Take 40 mg by mouth every other day.  . levocetirizine (XYZAL) 5 MG tablet Take 5 mg by mouth daily.  Marland Kitchen levofloxacin (LEVAQUIN) 750 MG tablet Take 1 tablet (750 mg total) by mouth every other day. (Patient not taking: Reported on 09/09/2017)  . metoprolol tartrate (LOPRESSOR) 25 MG tablet TAKE 1 TABLET TWICE A DAY  . mometasone-formoterol (DULERA) 100-5 MCG/ACT AERO Inhale 2 puffs into the lungs every 12 (twelve) hours as needed for wheezing or shortness of breath.  Marland Kitchen omeprazole (PRILOSEC) 20 MG capsule Take 20 mg by mouth daily.  . OXYGEN Inhale 2 L/min into the lungs at bedtime.  . phentermine (ADIPEX-P) 37.5 MG tablet Take 1 tablet (37.5 mg total) by mouth daily before breakfast.  . Spacer/Aero-Holding Chambers (AEROCHAMBER PLUS WITH MASK) inhaler 1 each by Other route once. Use as instructed  . VICTOZA 18 MG/3ML SOPN Inject 1.2 mg into the skin daily.   No facility-administered encounter medications on file as of 10/25/2017.     Surgical History: History reviewed. No pertinent surgical history.  Medical History: Past Medical History:  Diagnosis Date  . Diabetes mellitus without complication ([place])   . Hypertension   . Kidney disease     Family History: Family History  Problem Relation Age of Onset  . Diabetes Mellitus  II Mother   . CAD Father   . Hypertension Father     Social History: Social History   Socioeconomic History  . Marital status: Single    Spouse name: Not on file  . Number of children: Not on file  . Years of education: Not on file  . Highest education level: Not on file  Social Needs  . Financial resource strain: Not on file  . Food insecurity  - worry: Not on file  . Food insecurity - inability: Not on file  . Transportation needs - medical: Not on file  . Transportation needs - non-medical: Not on file  Occupational History  . Not on file  Tobacco Use  . Smoking status: Never Smoker  . Smokeless tobacco: Never Used  Substance and Sexual Activity  . Alcohol use: No  . Drug use: No  . Sexual activity: Not on file  Other Topics Concern  . Not on file  Social History Narrative  . Not on file    Vital Signs: Blood pressure (!) 148/88, pulse 98, resp. rate 16, height 5\' 2"  (1.575 m), weight 264 lb 12.8 oz (120.1 kg), SpO2 97 %.  Examination: General Appearance: The patient is well-developed, well-nourished, and in no distress. Skin: Gross inspection of skin unremarkable. Head: normocephalic, no gross deformities. Eyes: no gross deformities noted. ENT: ears appear grossly normal no exudates. Neck: Supple. No thyromegaly. No LAD. Respiratory: no rhonchi noted good air entry. Cardiovascular: Normal S1 and S2 without murmur or rub. Extremities: No cyanosis. pulses are equal. Neurologic: Alert and oriented. No involuntary movements.  LABS: Recent Results (from the past 2160 hour(s))  Urinalysis, Routine w reflex microscopic     Status: Abnormal   Collection Time: 09/09/17  3:42 PM  Result Value Ref Range   Specific Gravity, UA 1.014 1.005 - 1.030   pH, UA 7.0 5.0 - 7.5   Color, UA Yellow Yellow   Appearance Ur Clear Clear   Leukocytes, UA Trace (A) Negative   Protein, UA 3+ (A) Negative/Trace   Glucose, UA Negative Negative   Ketones, UA Negative Negative   RBC, UA Trace (A) Negative   Bilirubin, UA Negative Negative   Urobilinogen, Ur 0.2 0.2 - 1.0 mg/dL   Nitrite, UA Negative Negative   Microscopic Examination See below:     Comment: Microscopic was indicated and was performed.  Microscopic Examination     Status: None   Collection Time: 09/09/17  3:42 PM  Result Value Ref Range   WBC, UA 0-5 0 - 5 /hpf    RBC, UA 0-2 0 - 2 /hpf   Epithelial Cells (non renal) 0-10 0 - 10 /hpf   Casts None seen None seen /lpf   Mucus, UA Present Not Estab.   Bacteria, UA Few None seen/Few  POCT HgB A1C     Status: Normal   Collection Time: 09/09/17  4:19 PM  Result Value Ref Range   Hemoglobin A1C 5.4   Comprehensive metabolic panel     Status: Abnormal   Collection Time: 10/14/17  1:36 PM  Result Value Ref Range   Glucose 149 (H) 65 - 99 mg/dL   BUN 16 6 - 24 mg/dL   Creatinine, Ser 3.11 (H) 0.57 - 1.00 mg/dL   GFR calc non Af Amer 17 (L) >59 mL/min/1.73   GFR calc Af Amer 20 (L) >59 mL/min/1.73   BUN/Creatinine Ratio 5 (L) 9 - 23   Sodium 142 134 - 144 mmol/L   Potassium  4.4 3.5 - 5.2 mmol/L   Chloride 103 96 - 106 mmol/L   CO2 24 20 - 29 mmol/L   Calcium 8.9 8.7 - 10.2 mg/dL   Total Protein 6.8 6.0 - 8.5 g/dL   Albumin 3.2 (L) 3.5 - 5.5 g/dL   Globulin, Total 3.6 1.5 - 4.5 g/dL   Albumin/Globulin Ratio 0.9 (L) 1.2 - 2.2   Bilirubin Total 0.9 0.0 - 1.2 mg/dL   Alkaline Phosphatase 67 39 - 117 IU/L   AST 15 0 - 40 IU/L   ALT 10 0 - 32 IU/L  TSH + free T4     Status: None   Collection Time: 10/14/17  1:37 PM  Result Value Ref Range   TSH 3.490 0.450 - 4.500 uIU/mL   Free T4 1.30 0.82 - 1.77 ng/dL    Radiology: No results found.  No results found.  No results found.    Assessment and Plan: Patient Active Problem List   Diagnosis Date Noted  . Acute respiratory failure ([place]) 11/30/2015  . CAP (community acquired pneumonia) 11/30/2015  . Syncope and collapse 11/30/2015  . Diabetes mellitus without complication ([place]) 77/41/2878    1. OSA  Again encouraged to be compliant with CPAP as she is not able to tolerate We will continue with supportive care Work on weight loss We will continue to monitor closely 2. Asthma patient will be scheduled for follow-up pulmonary functions.  We will continue with supportive care currently not having any flare-ups 3. Allergic rhinitis stable at this  time we will continue with present management she has not had any flare of her rhinitis  General Counseling: I have discussed the findings of the evaluation and examination with [facility].  I have also discussed any further diagnostic evaluation thatmay be needed or ordered today. Zakaria verbalizes understanding of the findings of todays visit. We also reviewed her medications today and discussed drug interactions and side effects including but not limited excessive drowsiness and altered mental states. We also discussed that there is always a risk not just to her but also people around her. she has been encouraged to call the office with any questions or concerns that should arise related to todays visit.    Time spent: 104min  I have personally obtained a history, examined the patient, evaluated laboratory and imaging results, formulated the assessment and plan and placed orders.    Allyne Gee, MD [facility] Pulmonary and Critical Care Sleep medicine

## 2017-10-31 NOTE — Progress Notes (Signed)
Do u think she should be seen by nephrology

## 2017-11-04 ENCOUNTER — Encounter: Payer: Self-pay | Admitting: Internal Medicine

## 2017-11-04 DIAGNOSIS — E1122 Type 2 diabetes mellitus with diabetic chronic kidney disease: Secondary | ICD-10-CM | POA: Insufficient documentation

## 2017-11-04 DIAGNOSIS — G4733 Obstructive sleep apnea (adult) (pediatric): Secondary | ICD-10-CM | POA: Insufficient documentation

## 2017-11-04 DIAGNOSIS — I10 Essential (primary) hypertension: Secondary | ICD-10-CM | POA: Insufficient documentation

## 2017-11-04 DIAGNOSIS — J4540 Moderate persistent asthma, uncomplicated: Secondary | ICD-10-CM | POA: Insufficient documentation

## 2017-11-08 ENCOUNTER — Encounter: Payer: Self-pay | Admitting: Internal Medicine

## 2017-11-08 ENCOUNTER — Ambulatory Visit (INDEPENDENT_AMBULATORY_CARE_PROVIDER_SITE_OTHER): Payer: Medicare Other | Admitting: Internal Medicine

## 2017-11-08 VITALS — BP 138/100 | HR 91 | Temp 96.2°F | Resp 16 | Ht 62.0 in | Wt 257.6 lb

## 2017-11-08 DIAGNOSIS — N183 Chronic kidney disease, stage 3 unspecified: Secondary | ICD-10-CM

## 2017-11-08 DIAGNOSIS — I10 Essential (primary) hypertension: Secondary | ICD-10-CM

## 2017-11-08 DIAGNOSIS — R0902 Hypoxemia: Secondary | ICD-10-CM

## 2017-11-08 DIAGNOSIS — J4520 Mild intermittent asthma, uncomplicated: Secondary | ICD-10-CM

## 2017-11-08 MED ORDER — AZITHROMYCIN 250 MG PO TABS: ORAL_TABLET | ORAL | 0 refills | Status: DC

## 2017-11-08 NOTE — Progress Notes (Signed)
[facility] [place], [place] [address]  Internal MEDICINE  Office Visit Note  Patient Name: Michele Bartlett  [id number]  [id number]  Date of Service: 11/08/2017  Chief Complaint  Patient presents with  . Follow-up    labs  . Cough  . Sore Throat    HPI She has been having cough and sore throat for few days. Has been taking her antibiotics. She does have OSA but cannot tolerate her Cpap. She is only using her O2 at this time. Recent overnight pulse oximetry showed that she will need to have her O2 increased. Pt has nephrotic syndrome since childhood and has declining renal functions. Does see nephrology. She is trying to lose weight    Current Medication: Outpatient Encounter Medications as of 11/08/2017  Medication Sig  . albuterol (PROAIR HFA) 108 (90 Base) MCG/ACT inhaler Inhale 2 puffs into the lungs every 6 (six) hours as needed. For  Wheezing.  Marland Kitchen albuterol (PROVENTIL) (2.5 MG/3ML) 0.083% nebulizer solution Take 2.5 mg by nebulization every 6 (six) hours as needed for wheezing or shortness of breath.  . allopurinol (ZYLOPRIM) 100 MG tablet Take 100 mg by mouth daily.  Marland Kitchen azithromycin (ZITHROMAX) 250 MG tablet Take 250-500 mg by mouth See admin instructions. Take 2 tablets (500mg ) by mouth now, then take 1 tablet by mouth once a day x 4 days.  . benazepril (LOTENSIN) 40 MG tablet Take 40 mg by mouth daily.  . colchicine 0.6 MG tablet Take 0.6 mg by mouth 2 (two) times daily.  Marland Kitchen Dextromethorphan-Guaifenesin (MUCINEX DM MAXIMUM STRENGTH) 60-1200 MG TB12 Take 1 tablet by mouth every 12 (twelve) hours.  . fluticasone (FLONASE) 50 MCG/ACT nasal spray Place 1 spray into both nostrils 2 (two) times daily as needed for allergies or rhinitis. Reported on 11/30/2015  . furosemide (LASIX) 40 MG tablet Take 40 mg by mouth every other day.  . levocetirizine (XYZAL) 5 MG tablet Take 5 mg by mouth daily.  . metoprolol tartrate (LOPRESSOR) 25 MG tablet TAKE 1 TABLET TWICE A  DAY  . mometasone-formoterol (DULERA) 100-5 MCG/ACT AERO Inhale 2 puffs into the lungs every 12 (twelve) hours as needed for wheezing or shortness of breath.  Marland Kitchen NOVOFINE 30G X 8 MM MISC   . omeprazole (PRILOSEC) 20 MG capsule Take 20 mg by mouth daily.  . OXYGEN Inhale 2 L/min into the lungs at bedtime.  . phentermine (ADIPEX-P) 37.5 MG tablet Take 1 tablet (37.5 mg total) by mouth daily before breakfast. (Patient not taking: Reported on 11/08/2017)  . Spacer/Aero-Holding Chambers (AEROCHAMBER PLUS WITH MASK) inhaler 1 each by Other route once. Use as instructed  . VELTASSA 8.4 g packet   . VICTOZA 18 MG/3ML SOPN Inject 1.2 mg into the skin daily.  . [DISCONTINUED] levofloxacin (LEVAQUIN) 750 MG tablet Take 1 tablet (750 mg total) by mouth every other day. (Patient not taking: Reported on 09/09/2017)   No facility-administered encounter medications on file as of 11/08/2017.     Surgical History: History reviewed. No pertinent surgical history.  Medical History: Past Medical History:  Diagnosis Date  . Asthma   . Diabetes mellitus without complication ([place])   . Down's syndrome   . Environmental allergies   . Hypertension   . Kidney disease   . Nephrotic syndrome   . Sleep apnea     Family History: Family History  Problem Relation Age of Onset  . Diabetes Mellitus II Mother   . CAD Father   . Hypertension Father  Social History   Socioeconomic History  . Marital status: Single    Spouse name: Not on file  . Number of children: Not on file  . Years of education: Not on file  . Highest education level: Not on file  Social Needs  . Financial resource strain: Not on file  . Food insecurity - worry: Not on file  . Food insecurity - inability: Not on file  . Transportation needs - medical: Not on file  . Transportation needs - non-medical: Not on file  Occupational History  . Not on file  Tobacco Use  . Smoking status: Never Smoker  . Smokeless tobacco: Never Used   Substance and Sexual Activity  . Alcohol use: No  . Drug use: No  . Sexual activity: Not on file  Other Topics Concern  . Not on file  Social History Narrative  . Not on file      Review of Systems  Constitutional: Negative for chills, fatigue and unexpected weight change.  HENT: Positive for postnasal drip. Negative for congestion, rhinorrhea, sneezing and sore throat.   Eyes: Negative for redness.  Respiratory: Negative for cough, chest tightness and shortness of breath.   Cardiovascular: Negative for chest pain and palpitations.  Gastrointestinal: Negative for abdominal pain, constipation, diarrhea, nausea and vomiting.  Genitourinary: Negative for dysuria and frequency.  Musculoskeletal: Negative for arthralgias, back pain, joint swelling and neck pain.  Skin: Negative for rash.  Neurological: Negative.  Negative for tremors and numbness.  Hematological: Negative for adenopathy. Does not bruise/bleed easily.  Psychiatric/Behavioral: Negative for behavioral problems (Depression), sleep disturbance and suicidal ideas. The patient is not nervous/anxious.     Vital Signs: BP (!) 138/100 (BP Location: Left Arm, Patient Position: Sitting)   Pulse 91   Temp (!) 96.2 F (35.7 C) (Oral)   Resp 16   Ht 5\' 2"  (1.575 m)   Wt 257 lb 9.6 oz (116.8 kg)   SpO2 98%   BMI 47.12 kg/m    Physical Exam  Constitutional: She is oriented to person, place, and time. She appears well-developed and well-nourished. No distress.  HENT:  Head: Normocephalic and atraumatic.  Mouth/Throat: Oropharynx is clear and moist. No oropharyngeal exudate.  Eyes: EOM are normal. Pupils are equal, round, and reactive to light.  Neck: Normal range of motion. Neck supple. No JVD present. No tracheal deviation present. No thyromegaly present.  Cardiovascular: Normal rate, regular rhythm and normal heart sounds. Exam reveals no gallop and no friction rub.  No murmur heard. Pulmonary/Chest: Breath sounds  normal. She has no wheezes. She has no rales. She exhibits no tenderness.  Abdominal: Soft. Bowel sounds are normal.  Musculoskeletal: Normal range of motion.  Lymphadenopathy:    She has no cervical adenopathy.  Neurological: She is alert and oriented to person, place, and time. No cranial nerve deficit.  Skin: Skin is warm and dry. She is not diaphoretic.  Psychiatric: She has a normal mood and affect. Her behavior is normal. Judgment and thought content normal.    Assessment/Plan: 1. Mild intermittent asthma without complication Pt is to use her inhalers at regular times  2. Morbid obesity ([place]) Continue to watch ehr diet and encourage portion control  3. Hypoxia  Increase O2 per overnight oximetry results  4. Chronic kidney disease, stage 3 (moderate) ([place]) Per nephrology  5. Essential hypertension Slightly elevated today but controlled at home  General Counseling: neelah mannings understanding of the findings of todays visit and agrees with plan of treatment.  I have discussed any further diagnostic evaluation that may be needed or ordered today. We also reviewed her medications today. she has been encouraged to call the office with any questions or concerns that should arise related to todays visit. Pt is accompanied by her mother who has POA     Time spent: 40 Minutes   Dr Lavera Guise Internal medicine

## 2017-11-13 ENCOUNTER — Other Ambulatory Visit: Payer: Self-pay | Admitting: Internal Medicine

## 2017-11-22 ENCOUNTER — Telehealth: Payer: Self-pay

## 2017-11-22 NOTE — Telephone Encounter (Signed)
LEFT MESSAGE ADVISING THAT [facility] IS READY UP FRONT FOR PICK UP/ BR

## 2017-11-25 ENCOUNTER — Telehealth: Payer: Self-pay

## 2017-11-25 NOTE — Telephone Encounter (Signed)
FAXED O2 ORDER TO [facility]. BR

## 2017-12-02 ENCOUNTER — Telehealth: Payer: Self-pay

## 2017-12-02 NOTE — Telephone Encounter (Signed)
Faxed o2 night order to add with patient cpap to [facility], pox abnormal done incorrectly with [facility], patient uses [facility] so sent correct o2 order to correct company and put copy in scan/ br

## 2018-02-07 ENCOUNTER — Encounter: Payer: Self-pay | Admitting: Internal Medicine

## 2018-02-07 ENCOUNTER — Ambulatory Visit (INDEPENDENT_AMBULATORY_CARE_PROVIDER_SITE_OTHER): Payer: Medicare Other | Admitting: Internal Medicine

## 2018-02-07 VITALS — BP 138/78 | HR 87 | Resp 16 | Ht 62.5 in | Wt 267.0 lb

## 2018-02-07 DIAGNOSIS — N183 Chronic kidney disease, stage 3 unspecified: Secondary | ICD-10-CM

## 2018-02-07 DIAGNOSIS — Z1239 Encounter for other screening for malignant neoplasm of breast: Secondary | ICD-10-CM

## 2018-02-07 DIAGNOSIS — Z1231 Encounter for screening mammogram for malignant neoplasm of breast: Secondary | ICD-10-CM | POA: Diagnosis not present

## 2018-02-07 DIAGNOSIS — N049 Nephrotic syndrome with unspecified morphologic changes: Secondary | ICD-10-CM

## 2018-02-07 DIAGNOSIS — E119 Type 2 diabetes mellitus without complications: Secondary | ICD-10-CM

## 2018-02-07 DIAGNOSIS — G4733 Obstructive sleep apnea (adult) (pediatric): Secondary | ICD-10-CM

## 2018-02-07 LAB — POCT GLYCOSYLATED HEMOGLOBIN (HGB A1C): Hemoglobin A1C: 5.1

## 2018-02-07 NOTE — Progress Notes (Signed)
[facility] [place], [place] [address]  Internal MEDICINE  Office Visit Note  Patient Name: Michele Bartlett  [id number]  [id number]  Date of Service: 02/07/2018  Chief Complaint  Patient presents with  . Diabetes  . Hypertension  . Chronic Kidney Disease  . Obesity    HPI  Pt is here for routine follow up. Pt has nephrotic syndrome since her childhood. Lately decline in her renal functions. Pt is having hard time losing weight due to fluid retention. Will like to see a nutritionist. Does see nephrology    Current Medication: Outpatient Encounter Medications as of 02/07/2018  Medication Sig  . albuterol (PROAIR HFA) 108 (90 Base) MCG/ACT inhaler Inhale 2 puffs into the lungs every 6 (six) hours as needed. For  Wheezing.  Marland Kitchen albuterol (PROVENTIL) (2.5 MG/3ML) 0.083% nebulizer solution Take 2.5 mg by nebulization every 6 (six) hours as needed for wheezing or shortness of breath.  . allopurinol (ZYLOPRIM) 100 MG tablet Take 100 mg by mouth daily.  Marland Kitchen azithromycin (ZITHROMAX) 250 MG tablet Take one tab po qd for 7 days  . benazepril (LOTENSIN) 40 MG tablet Take 40 mg by mouth daily.  . colchicine 0.6 MG tablet Take 0.6 mg by mouth 2 (two) times daily.  Marland Kitchen Dextromethorphan-Guaifenesin (MUCINEX DM MAXIMUM STRENGTH) 60-1200 MG TB12 Take 1 tablet by mouth every 12 (twelve) hours.  . fluticasone (FLONASE) 50 MCG/ACT nasal spray Place 1 spray into both nostrils 2 (two) times daily as needed for allergies or rhinitis. Reported on 11/30/2015  . furosemide (LASIX) 40 MG tablet TAKE 1 TABLET EVERY OTHER DAY  . levocetirizine (XYZAL) 5 MG tablet Take 5 mg by mouth daily.  . metoprolol tartrate (LOPRESSOR) 25 MG tablet TAKE 1 TABLET TWICE A DAY  . mometasone-formoterol (DULERA) 100-5 MCG/ACT AERO Inhale 2 puffs into the lungs every 12 (twelve) hours as needed for wheezing or shortness of breath.  Marland Kitchen NOVOFINE 30G X 8 MM MISC   . omeprazole (PRILOSEC) 20 MG capsule Take 20 mg by  mouth daily.  . OXYGEN Inhale 2 L/min into the lungs at bedtime.  . phentermine (ADIPEX-P) 37.5 MG tablet Take 1 tablet (37.5 mg total) by mouth daily before breakfast. (Patient not taking: Reported on 11/08/2017)  . Spacer/Aero-Holding Chambers (AEROCHAMBER PLUS WITH MASK) inhaler 1 each by Other route once. Use as instructed  . VELTASSA 8.4 g packet   . VICTOZA 18 MG/3ML SOPN Inject 1.2 mg into the skin daily.   No facility-administered encounter medications on file as of 02/07/2018.     Surgical History: No past surgical history on file.  Medical History: Past Medical History:  Diagnosis Date  . Asthma   . Diabetes mellitus without complication ([place])   . Down's syndrome   . Environmental allergies   . Hypertension   . Kidney disease   . Nephrotic syndrome   . Sleep apnea     Family History: Family History  Problem Relation Age of Onset  . Diabetes Mellitus II Mother   . CAD Father   . Hypertension Father     Social History   Socioeconomic History  . Marital status: Single    Spouse name: Not on file  . Number of children: Not on file  . Years of education: Not on file  . Highest education level: Not on file  Occupational History  . Not on file  Social Needs  . Financial resource strain: Not on file  . Food insecurity:  Worry: Not on file    Inability: Not on file  . Transportation needs:    Medical: Not on file    Non-medical: Not on file  Tobacco Use  . Smoking status: Never Smoker  . Smokeless tobacco: Never Used  Substance and Sexual Activity  . Alcohol use: No  . Drug use: No  . Sexual activity: Not on file  Lifestyle  . Physical activity:    Days per week: Not on file    Minutes per session: Not on file  . Stress: Not on file  Relationships  . Social connections:    Talks on phone: Not on file    Gets together: Not on file    Attends religious service: Not on file    Active member of club or organization: Not on file    Attends meetings of  clubs or organizations: Not on file    Relationship status: Not on file  . Intimate partner violence:    Fear of current or ex partner: Not on file    Emotionally abused: Not on file    Physically abused: Not on file    Forced sexual activity: Not on file  Other Topics Concern  . Not on file  Social History Narrative  . Not on file   Review of Systems  Constitutional: Negative for chills, diaphoresis and fatigue.  HENT: Negative for ear pain, postnasal drip and sinus pressure.   Eyes: Negative for photophobia, discharge, redness, itching and visual disturbance.  Respiratory: Negative for cough, shortness of breath and wheezing.   Cardiovascular: Negative for chest pain, palpitations and leg swelling.  Gastrointestinal: Negative for abdominal pain, constipation, diarrhea, nausea and vomiting.  Genitourinary: Negative for dysuria and flank pain.  Musculoskeletal: Negative for arthralgias, back pain, gait problem and neck pain.  Skin: Negative for color change.  Allergic/Immunologic: Negative for environmental allergies and food allergies.  Neurological: Negative for dizziness and headaches.  Hematological: Does not bruise/bleed easily.  Psychiatric/Behavioral: Negative for agitation, behavioral problems (depression) and hallucinations.    Vital Signs: BP 138/78   Pulse 87   Resp 16   Ht 5' 2.5" (1.588 m)   Wt 267 lb (121.1 kg)   SpO2 100%   BMI 48.06 kg/m    Physical Exam  Constitutional: She is oriented to person, place, and time. She appears well-developed and well-nourished. No distress.  HENT:  Head: Normocephalic and atraumatic.  Mouth/Throat: Oropharynx is clear and moist. No oropharyngeal exudate.  Eyes: Pupils are equal, round, and reactive to light. EOM are normal.  Neck: Normal range of motion. Neck supple. No JVD present. No tracheal deviation present. No thyromegaly present.  Cardiovascular: Normal rate, regular rhythm and normal heart sounds. Exam reveals no  gallop and no friction rub.  No murmur heard. Pulmonary/Chest: Effort normal. No respiratory distress. She has no wheezes. She has no rales. She exhibits no tenderness.  Abdominal: Soft. Bowel sounds are normal.  Musculoskeletal: Normal range of motion.  Lymphadenopathy:    She has no cervical adenopathy.  Neurological: She is alert and oriented to person, place, and time. No cranial nerve deficit.  Skin: Skin is warm and dry. She is not diaphoretic.  Psychiatric: She has a normal mood and affect. Her behavior is normal. Judgment and thought content normal.   Assessment/Plan: 1. Diabetes mellitus without complication ([place]) - POCT HgB A1C, controlled   2. Chronic kidney disease, stage 3 (moderate) ([place]) - Per nephrology  3. Nephrotic syndrome - Per Nephrology  4.  Obstructive sleep apnea syndrome - Encouraged compliance with CPAP  5. Breast cancer screening - MM DIGITAL SCREENING BILATERAL; Future  6. Morbid obesity ([place]) - Amb ref to Medical Nutrition Therapy-MNT  General Counseling: Queenie verbalizes understanding of the findings of todays visit and agrees with plan of treatment. I have discussed any further diagnostic evaluation that may be needed or ordered today. We also reviewed her medications today. she has been encouraged to call the office with any questions or concerns that should arise related to todays visit.   Orders Placed This Encounter  Procedures  . MM DIGITAL SCREENING BILATERAL  . Amb ref to Medical Nutrition Therapy-MNT  . POCT HgB A1C    Time spent:20 Minutes  Dr Lavera Guise Internal medicine

## 2018-02-16 ENCOUNTER — Other Ambulatory Visit: Payer: Self-pay | Admitting: Internal Medicine

## 2018-02-16 DIAGNOSIS — R921 Mammographic calcification found on diagnostic imaging of breast: Secondary | ICD-10-CM

## 2018-02-22 ENCOUNTER — Telehealth: Payer: Self-pay

## 2018-02-22 NOTE — Telephone Encounter (Signed)
Left voice message and asked patient mom to return call regarding referral appointment. Michele Bartlett

## 2018-03-08 ENCOUNTER — Ambulatory Visit (INDEPENDENT_AMBULATORY_CARE_PROVIDER_SITE_OTHER): Payer: Medicare Other | Admitting: Internal Medicine

## 2018-03-08 ENCOUNTER — Ambulatory Visit: Payer: Self-pay | Admitting: Internal Medicine

## 2018-03-08 DIAGNOSIS — J4520 Mild intermittent asthma, uncomplicated: Secondary | ICD-10-CM

## 2018-03-08 LAB — PULMONARY FUNCTION TEST

## 2018-03-14 ENCOUNTER — Ambulatory Visit
Admission: RE | Admit: 2018-03-14 | Discharge: 2018-03-14 | Disposition: A | Discharge status: Home / Self Care | Payer: Medicare Other | Source: Ambulatory Visit | Attending: Internal Medicine | Admitting: Internal Medicine

## 2018-03-14 ENCOUNTER — Other Ambulatory Visit: Payer: Self-pay | Admitting: Internal Medicine

## 2018-03-14 DIAGNOSIS — Z1231 Encounter for screening mammogram for malignant neoplasm of breast: Secondary | ICD-10-CM | POA: Insufficient documentation

## 2018-03-14 DIAGNOSIS — Z1239 Encounter for other screening for malignant neoplasm of breast: Secondary | ICD-10-CM

## 2018-03-14 DIAGNOSIS — R928 Other abnormal and inconclusive findings on diagnostic imaging of breast: Secondary | ICD-10-CM

## 2018-03-14 DIAGNOSIS — R921 Mammographic calcification found on diagnostic imaging of breast: Secondary | ICD-10-CM

## 2018-03-17 ENCOUNTER — Other Ambulatory Visit: Payer: Self-pay | Admitting: Nurse Practitioner

## 2018-03-17 DIAGNOSIS — R921 Mammographic calcification found on diagnostic imaging of breast: Secondary | ICD-10-CM

## 2018-03-17 DIAGNOSIS — R928 Other abnormal and inconclusive findings on diagnostic imaging of breast: Secondary | ICD-10-CM

## 2018-03-22 ENCOUNTER — Ambulatory Visit
Admission: RE | Admit: 2018-03-22 | Discharge: 2018-03-22 | Disposition: A | Discharge status: Home / Self Care | Payer: Medicare Other | Source: Ambulatory Visit | Attending: Nurse Practitioner | Admitting: Nurse Practitioner

## 2018-03-22 DIAGNOSIS — R928 Other abnormal and inconclusive findings on diagnostic imaging of breast: Secondary | ICD-10-CM

## 2018-03-22 DIAGNOSIS — R921 Mammographic calcification found on diagnostic imaging of breast: Secondary | ICD-10-CM | POA: Diagnosis present

## 2018-03-22 HISTORY — PX: BREAST BIOPSY: SHX20

## 2018-03-23 LAB — SURGICAL PATHOLOGY

## 2018-03-28 NOTE — Procedures (Signed)
[facility] [place], [id number]  DATE OF SERVICE: March 08, 2018  Complete Pulmonary Function Testing Interpretation:  FINDINGS:   patient had a spirometry done she was not able to complete T complete pulmonary function study.  The forced vital capacity is severely decreased.  The FEV1 is 0.8 L which is 36% predicted and is severely decreased.  The FEV1 FVC ratio is mildly decreased.  Post bronchodilator there is no significant change in the FEV1 however clinical improvement may occur in the absence  Of spirometric improvement  IMPRESSION:   spirometry demonstrates severe obstructive lung disease.  Patient was not able to perform lung volumes to to rule out a restrictive lung disease component clinical correlation is recommended  Allyne Gee, MD [facility] Pulmonary Critical Care Medicine Sleep Medicine

## 2018-04-07 ENCOUNTER — Other Ambulatory Visit: Payer: Self-pay | Admitting: Internal Medicine

## 2018-04-10 ENCOUNTER — Other Ambulatory Visit: Payer: Self-pay | Admitting: Internal Medicine

## 2018-04-10 MED ORDER — LEVOCETIRIZINE DIHYDROCHLORIDE 5 MG PO TABS: 5.0000 mg | ORAL_TABLET | Freq: Every day | ORAL | 4 refills | Status: AC

## 2018-04-25 ENCOUNTER — Ambulatory Visit (INDEPENDENT_AMBULATORY_CARE_PROVIDER_SITE_OTHER): Payer: Medicare Other | Admitting: Internal Medicine

## 2018-04-25 ENCOUNTER — Encounter: Payer: Self-pay | Admitting: Internal Medicine

## 2018-04-25 VITALS — BP 140/76 | HR 95 | Resp 16 | Ht 62.5 in | Wt 260.8 lb

## 2018-04-25 DIAGNOSIS — J4540 Moderate persistent asthma, uncomplicated: Secondary | ICD-10-CM | POA: Diagnosis not present

## 2018-04-25 DIAGNOSIS — G4733 Obstructive sleep apnea (adult) (pediatric): Secondary | ICD-10-CM | POA: Diagnosis not present

## 2018-04-25 NOTE — Patient Instructions (Signed)

## 2018-04-25 NOTE — Progress Notes (Signed)
[facility] [place], [place] [address]  Pulmonary Sleep Medicine   Office Visit Note  Patient Name: Michele Bartlett DOB: April 01, 1971 MRN [id number]  Date of Service: 04/25/2018  Complaints/HPI:  Patient is doing fairly well she is still not using her CPAP states that she has difficulty with mask.  She is going to call her DME provider to see about getting a different mask set for her.  She has not had any flare-ups of her asthma.  She is not having to use her inhalers frequently.  Currently is comfortable and has no other significant complaints  ROS  General: (-) fever, (-) chills, (-) night sweats, (-) weakness Skin: (-) rashes, (-) itching,. Eyes: (-) visual changes, (-) redness, (-) itching. Nose and Sinuses: (-) nasal stuffiness or itchiness, (-) postnasal drip, (-) nosebleeds, (-) sinus trouble. Mouth and Throat: (-) sore throat, (-) hoarseness. Neck: (-) swollen glands, (-) enlarged thyroid, (-) neck pain. Respiratory: - cough, (-) bloody sputum, + shortness of breath, - wheezing. Cardiovascular: - ankle swelling, (-) chest pain. Lymphatic: (-) lymph node enlargement. Neurologic: (-) numbness, (-) tingling. Psychiatric: (-) anxiety, (-) depression   Current Medication: Outpatient Encounter Medications as of 04/25/2018  Medication Sig  . albuterol (PROAIR HFA) 108 (90 Base) MCG/ACT inhaler Inhale 2 puffs into the lungs every 6 (six) hours as needed. For  Wheezing.  Marland Kitchen albuterol (PROVENTIL) (2.5 MG/3ML) 0.083% nebulizer solution Take 2.5 mg by nebulization every 6 (six) hours as needed for wheezing or shortness of breath.  . allopurinol (ZYLOPRIM) 100 MG tablet Take 100 mg by mouth daily.  Marland Kitchen azithromycin (ZITHROMAX) 250 MG tablet Take one tab po qd for 7 days  . benazepril (LOTENSIN) 40 MG tablet Take 40 mg by mouth daily.  . colchicine 0.6 MG tablet Take 0.6 mg by mouth 2 (two) times daily.  Marland Kitchen Dextromethorphan-Guaifenesin (MUCINEX DM MAXIMUM STRENGTH)  60-1200 MG TB12 Take 1 tablet by mouth every 12 (twelve) hours.  . fluticasone (FLONASE) 50 MCG/ACT nasal spray Place 1 spray into both nostrils 2 (two) times daily as needed for allergies or rhinitis. Reported on 11/30/2015  . furosemide (LASIX) 40 MG tablet TAKE 1 TABLET EVERY OTHER DAY  . levocetirizine (XYZAL) 5 MG tablet Take 1 tablet (5 mg total) by mouth daily.  . metoprolol tartrate (LOPRESSOR) 25 MG tablet TAKE 1 TABLET TWICE A DAY  . mometasone-formoterol (DULERA) 100-5 MCG/ACT AERO Inhale 2 puffs into the lungs every 12 (twelve) hours as needed for wheezing or shortness of breath.  Marland Kitchen NOVOFINE 30G X 8 MM MISC   . NOVOFINE 32G X 6 MM MISC USE AS DIRECTED  . omeprazole (PRILOSEC) 20 MG capsule Take 20 mg by mouth daily.  . OXYGEN Inhale 2 L/min into the lungs at bedtime.  Marland Kitchen Spacer/Aero-Holding Chambers (AEROCHAMBER PLUS WITH MASK) inhaler 1 each by Other route once. Use as instructed  . VELTASSA 8.4 g packet   . VICTOZA 18 MG/3ML SOPN Inject 1.2 mg into the skin daily.  . [DISCONTINUED] phentermine (ADIPEX-P) 37.5 MG tablet Take 1 tablet (37.5 mg total) by mouth daily before breakfast. (Patient not taking: Reported on 11/08/2017)   No facility-administered encounter medications on file as of 04/25/2018.     Surgical History: Past Surgical History:  Procedure Laterality Date  . BREAST BIOPSY Left 03/22/2018   affirm stero biopsy x 2 areas/path pending    Medical History: Past Medical History:  Diagnosis Date  . Asthma   . Diabetes mellitus without complication (  [place])   . Down's syndrome   . Environmental allergies   . Hypertension   . Kidney disease   . Nephrotic syndrome   . Sleep apnea     Family History: Family History  Problem Relation Age of Onset  . Diabetes Mellitus II Mother   . CAD Father   . Hypertension Father     Social History: Social History   Socioeconomic History  . Marital status: Single    Spouse name: Not on file  . Number of children: Not on  file  . Years of education: Not on file  . Highest education level: Not on file  Occupational History  . Not on file  Social Needs  . Financial resource strain: Not on file  . Food insecurity:    Worry: Not on file    Inability: Not on file  . Transportation needs:    Medical: Not on file    Non-medical: Not on file  Tobacco Use  . Smoking status: Never Smoker  . Smokeless tobacco: Never Used  Substance and Sexual Activity  . Alcohol use: No  . Drug use: No  . Sexual activity: Not on file  Lifestyle  . Physical activity:    Days per week: Not on file    Minutes per session: Not on file  . Stress: Not on file  Relationships  . Social connections:    Talks on phone: Not on file    Gets together: Not on file    Attends religious service: Not on file    Active member of club or organization: Not on file    Attends meetings of clubs or organizations: Not on file    Relationship status: Not on file  . Intimate partner violence:    Fear of current or ex partner: Not on file    Emotionally abused: Not on file    Physically abused: Not on file    Forced sexual activity: Not on file  Other Topics Concern  . Not on file  Social History Narrative  . Not on file    Vital Signs: Blood pressure 140/76, pulse 95, resp. rate 16, height 5' 2.5" (1.588 m), weight 260 lb 12.8 oz (118.3 kg), SpO2 90 %.  Examination: General Appearance: The patient is well-developed, well-nourished, and in no distress. Skin: Gross inspection of skin unremarkable. Head: normocephalic, no gross deformities. Eyes: no gross deformities noted. ENT: ears appear grossly normal no exudates. Neck: Supple. No thyromegaly. No LAD. Respiratory: no rhonchi noted at this time. Cardiovascular: Normal S1 and S2 without murmur or rub. Extremities: No cyanosis. pulses are equal. Neurologic: Alert and oriented. No involuntary movements.  LABS: Recent Results (from the past 2160 hour(s))  POCT HgB A1C     Status:  None   Collection Time: 02/07/18 10:50 AM  Result Value Ref Range   Hemoglobin A1C 5.1   Surgical pathology     Status: None   Collection Time: 03/22/18  1:49 PM  Result Value Ref Range   SURGICAL PATHOLOGY      Surgical Pathology CASE: [id number] PATIENT: Mitzi Hansen Surgical Pathology Report     SPECIMEN SUBMITTED: A. Breast, left, UIQ middle depth; biopsy (x-shaped marker) B. 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     DIAGNOSIS: A. LEFT BREAST #1, UPPER INNER QUADRANT, MIDDLE DEPTH; STEREOTACTIC-GUIDED CORE BIOPSY: -  FIBROADENOMATOUS CHANGES WITH STROMAL HYALINIZATION AND CALCIFICATIONS. - NEGATIVE FOR ATYPIA AND MALIGNANCY.  B. LEFT BREAST #2, UPPER INNER QUADRANT, POSTERIOR DEPTH; STEREOTACTIC-GUIDED CORE BIOPSY: - FIBROADENOMATOUS CHANGES WITH STROMAL HYALINIZATION AND CALCIFICATIONS. - NEGATIVE FOR ATYPIA AND MALIGNANCY.  Comment: Correlation with imaging findings is recommended.   GROSS DESCRIPTION: A. Labeled: Left breast stereo #1 UIQ mi ddle depth Received: in a formalin-filled [place] collection device Accompanying specimen radiograph: Yes Time/Date in fixative: 1:14 PM on 03/22/2018 Cold ischemic time: Less than 5 minutes Total fixation time: 7.5 hours Core pieces: Multiple Measurement: Aggregate, 3.2 x 1.5 x 0.2 cm Description / comments: Yellow lobulated fibrofatty Inked: Green Entirely submitted in cassette(s): 1 - section A 2 - section C 3 - remaining tissue  B. Labeled: Left breast #2 UIQ posterior depth Received: in a formalin-filled [place] collection device Accompanying specimen radiograph: Yes Time/Date in fixative: 1:37 PM on 03/22/2018 Cold ischemic time: Less than 5 minutes Total fixation time: 7 hours Core pieces:  Multiple Measurement: Aggregate, 3.2 x 1.5 x 0.2 cm Description / comments: Yellow lobulated fibrofatty Inked: Black Entirely submitted in cassette(s): 1 - section A 2 - section B 3 - section C 4 - remaining minute fragments (may not survive processing)   Final Sharma Covert agnosis performed by Bryan Lemma, MD.   Electronically signed 03/23/2018 2:58:33PM The electronic signature indicates that the named Attending Pathologist has evaluated the specimen  Technical component performed at [facility], [address], [place], [place] [address] Lab: [phone] Dir: Rush Farmer, MD, MMM  Professional component performed at [facility], [facility], [place], [place], [place] [address] Lab: [phone] Dir: Dellia Nims. Reuel Derby, MD     Radiology: [facility] Clip Placement Left  Result Date: 03/22/2018 CLINICAL DATA:  Post stereotactic core needle biopsy of left breast calcifications. EXAM: DIAGNOSTIC LEFT MAMMOGRAM POST STEREOTACTIC BIOPSY COMPARISON:  Previous exam(s). FINDINGS: Mammographic images were obtained following stereotactic guided biopsy of left breast upper inner quadrant calcifications. 1. Upper inner quadrant, middle depth, X shaped marker. Marker 5 mm inferior to the residual calcifications. 2. Upper inner quadrant, posterior depth, coil shaped marker. Marker in appropriate mammographic position. IMPRESSION: Successful placement of tissue markers post stereotactic core needle biopsy of left breast: 1. Upper inner quadrant, middle depth, X shaped marker. Marker 5 mm inferior to the residual calcifications. 2. Upper inner quadrant, posterior depth, coil shaped marker. Marker in appropriate mammographic position. Final Assessment: Post Procedure Mammograms for Marker Placement Electronically Signed   By: Fidela Salisbury M.D.   On: 03/22/2018 14:09   Mm Lt Breast Bx W Loc Dev 1st Lesion Image Bx Spec Stereo Guide  Addendum Date: 03/24/2018   ADDENDUM REPORT: 03/24/2018 10:46  ADDENDUM: Pathology of the left breast biopsy, middle depth revealed A. LEFT BREAST #1, UPPER INNER QUADRANT, MIDDLE DEPTH; STEREOTACTIC-GUIDED CORE BIOPSY: FIBROADENOMATOUS CHANGES WITH STROMAL HYALINIZATION AND CALCIFICATIONS. NEGATIVE FOR ATYPIA AND MALIGNANCY. Pathology of the left breast biopsy, posterior depth revealed B. LEFT BREAST #2, UPPER INNER QUADRANT, POSTERIOR DEPTH; STEREOTACTIC-GUIDED CORE BIOPSY: FIBROADENOMATOUS CHANGES WITH STROMAL HYALINIZATION AND CALCIFICATIONS. NEGATIVE FOR ATYPIA AND MALIGNANCY. Both biopsies found to be concordant by Dr. Jetta Lout. Recommendation: Return to routine screening mammography in one year. At the patient's request, results and recommendations were relayed to the patient's mother Blanch Media by phone by Jetta Lout, [place] on 03/23/18. The mother stated her daughter has done well following the biopsy with no bleeding, bruising or pain. Post biopsy instructions were reviewed with the patient's mother and all of her questions were answered.  She was encouraged to call the [facility] with any further questions or concerns. Addendum by Jetta Lout, RRA on 03/24/18. Electronically Signed   By: Fidela Salisbury M.D.   On: 03/24/2018 10:46   Result Date: 03/24/2018 CLINICAL DATA:  Two groups of calcifications in the left breast upper inner quadrant, middle and posterior depth. EXAM: LEFT BREAST STEREOTACTIC CORE NEEDLE BIOPSY COMPARISON:  Previous exams. FINDINGS: The patient and I discussed the procedure of stereotactic-guided biopsy including benefits and alternatives. We discussed the high likelihood of a successful procedure. We discussed the risks of the procedure including infection, bleeding, tissue injury, clip migration, and inadequate sampling. Informed written consent was given. The usual time out protocol was performed immediately prior to the procedure. Using sterile technique and 1% Lidocaine as local anesthetic, under stereotactic guidance, a 9 gauge  vacuum assisted device was used to perform core needle biopsy of calcifications in the upper inner quadrant of the left breast, middle depth using a superior approach. Specimen radiograph was performed showing presence of calcifications. Specimens with calcifications are identified for pathology. Lesion quadrant: Upper inner quadrant. At the conclusion of the procedure, a X shaped tissue marker clip was deployed into the biopsy cavity. Using sterile technique and 1% Lidocaine as local anesthetic, under stereotactic guidance, a 9 gauge vacuum assisted device was used to perform core needle biopsy of calcifications in the upper inner quadrant of the left breast, posterior depth using a superior approach. Specimen radiograph was performed showing presence of calcifications. Specimens with calcifications are identified for pathology. Lesion quadrant: Upper inner quadrant. At the conclusion of the procedure, a coil shaped tissue marker clip was deployed into the biopsy cavity. Follow-up 2-view mammogram was performed and dictated separately. IMPRESSION: Stereotactic-guided biopsy of left breast upper inner quadrant calcifications: 1.  Upper inner quadrant, middle depth, X shaped marker 2.  Upper inner quadrant, posterior depth, coil shaped marker. No apparent complications. Electronically Signed: By: Fidela Salisbury M.D. On: 03/22/2018 14:07   Mm Lt Breast Bx W Loc Dev Ea Ad Lesion Img Bx Spec Stereo Guide  Addendum Date: 03/24/2018   ADDENDUM REPORT: 03/24/2018 10:46 ADDENDUM: Pathology of the left breast biopsy, middle depth revealed A. LEFT BREAST #1, UPPER INNER QUADRANT, MIDDLE DEPTH; STEREOTACTIC-GUIDED CORE BIOPSY: FIBROADENOMATOUS CHANGES WITH STROMAL HYALINIZATION AND CALCIFICATIONS. NEGATIVE FOR ATYPIA AND MALIGNANCY. Pathology of the left breast biopsy, posterior depth revealed B. LEFT BREAST #2, UPPER INNER QUADRANT, POSTERIOR DEPTH; STEREOTACTIC-GUIDED CORE BIOPSY: FIBROADENOMATOUS CHANGES WITH  STROMAL HYALINIZATION AND CALCIFICATIONS. NEGATIVE FOR ATYPIA AND MALIGNANCY. Both biopsies found to be concordant by Dr. Jetta Lout. Recommendation: Return to routine screening mammography in one year. At the patient's request, results and recommendations were relayed to the patient's mother Blanch Media by phone by Jetta Lout, [place] on 03/23/18. The mother stated her daughter has done well following the biopsy with no bleeding, bruising or pain. Post biopsy instructions were reviewed with the patient's mother and all of her questions were answered. She was encouraged to call the [facility] with any further questions or concerns. Addendum by Jetta Lout, RRA on 03/24/18. Electronically Signed   By: Fidela Salisbury M.D.   On: 03/24/2018 10:46   Result Date: 03/24/2018 CLINICAL DATA:  Two groups of calcifications in the left breast upper inner quadrant, middle and posterior depth. EXAM: LEFT BREAST STEREOTACTIC CORE NEEDLE BIOPSY COMPARISON:  Previous exams. FINDINGS: The patient and I discussed the procedure of stereotactic-guided biopsy including benefits and alternatives. We discussed the high likelihood of a  successful procedure. We discussed the risks of the procedure including infection, bleeding, tissue injury, clip migration, and inadequate sampling. Informed written consent was given. The usual time out protocol was performed immediately prior to the procedure. Using sterile technique and 1% Lidocaine as local anesthetic, under stereotactic guidance, a 9 gauge vacuum assisted device was used to perform core needle biopsy of calcifications in the upper inner quadrant of the left breast, middle depth using a superior approach. Specimen radiograph was performed showing presence of calcifications. Specimens with calcifications are identified for pathology. Lesion quadrant: Upper inner quadrant. At the conclusion of the procedure, a X shaped tissue marker clip was deployed into the biopsy cavity. Using  sterile technique and 1% Lidocaine as local anesthetic, under stereotactic guidance, a 9 gauge vacuum assisted device was used to perform core needle biopsy of calcifications in the upper inner quadrant of the left breast, posterior depth using a superior approach. Specimen radiograph was performed showing presence of calcifications. Specimens with calcifications are identified for pathology. Lesion quadrant: Upper inner quadrant. At the conclusion of the procedure, a coil shaped tissue marker clip was deployed into the biopsy cavity. Follow-up 2-view mammogram was performed and dictated separately. IMPRESSION: Stereotactic-guided biopsy of left breast upper inner quadrant calcifications: 1.  Upper inner quadrant, middle depth, X shaped marker 2.  Upper inner quadrant, posterior depth, coil shaped marker. No apparent complications. Electronically Signed: By: Fidela Salisbury M.D. On: 03/22/2018 14:07    No results found.  No results found.    Assessment and Plan: Patient Active Problem List   Diagnosis Date Noted  . Type 2 diabetes mellitus with diabetic chronic kidney disease ([place]) 11/04/2017  . Essential (primary) hypertension 11/04/2017  . Moderate persistent asthma, uncomplicated 71/69/6789  . Morbid (severe) obesity due to excess calories ([place]) 11/04/2017  . Obstructive sleep apnea, adult 11/04/2017  . Acute respiratory failure ([place]) 11/30/2015  . CAP (community acquired pneumonia) 11/30/2015  . Syncope and collapse 11/30/2015  . Diabetes mellitus without complication ([place]) 38/06/1750    1. Asthma under controlled will continue with present management continue with inhalers also check a spirometry.  We will continue follow-up as planned prior 2. OSA discussed on usage of CPAP once again.  She is going to try a nasal CPAP mask hopefully this will help to be more compliant 3. Morbid obesity will continue with supportive care dietary management as tolerated  General Counseling: I have  discussed the findings of the evaluation and examination with [facility].  I have also discussed any further diagnostic evaluation thatmay be needed or ordered today. Destany verbalizes understanding of the findings of todays visit. We also reviewed her medications today and discussed drug interactions and side effects including but not limited excessive drowsiness and altered mental states. We also discussed that there is always a risk not just to her but also people around her. she has been encouraged to call the office with any questions or concerns that should arise related to todays visit.    Time spent: 56min  I have personally obtained a history, examined the patient, evaluated laboratory and imaging results, formulated the assessment and plan and placed orders.    Allyne Gee, MD [facility] Pulmonary and Critical Care Sleep medicine

## 2018-06-05 ENCOUNTER — Other Ambulatory Visit: Payer: Self-pay

## 2018-06-05 MED ORDER — VICTOZA 18 MG/3ML ~~LOC~~ SOPN: PEN_INJECTOR | SUBCUTANEOUS | 3 refills | Status: DC

## 2018-06-09 ENCOUNTER — Other Ambulatory Visit: Payer: Self-pay | Admitting: Internal Medicine

## 2018-06-09 MED ORDER — VICTOZA 18 MG/3ML ~~LOC~~ SOPN: PEN_INJECTOR | SUBCUTANEOUS | 3 refills | Status: DC

## 2018-06-12 ENCOUNTER — Encounter: Payer: Self-pay | Admitting: Nurse Practitioner

## 2018-06-12 ENCOUNTER — Ambulatory Visit (INDEPENDENT_AMBULATORY_CARE_PROVIDER_SITE_OTHER): Payer: Medicare Other | Admitting: Nurse Practitioner

## 2018-06-12 VITALS — BP 142/80 | HR 86 | Resp 16 | Ht 62.5 in | Wt 265.0 lb

## 2018-06-12 DIAGNOSIS — J4540 Moderate persistent asthma, uncomplicated: Secondary | ICD-10-CM | POA: Diagnosis not present

## 2018-06-12 DIAGNOSIS — Z23 Encounter for immunization: Secondary | ICD-10-CM | POA: Diagnosis not present

## 2018-06-12 DIAGNOSIS — E1122 Type 2 diabetes mellitus with diabetic chronic kidney disease: Secondary | ICD-10-CM

## 2018-06-12 DIAGNOSIS — I10 Essential (primary) hypertension: Secondary | ICD-10-CM | POA: Diagnosis not present

## 2018-06-12 DIAGNOSIS — N182 Chronic kidney disease, stage 2 (mild): Secondary | ICD-10-CM

## 2018-06-12 LAB — POCT GLYCOSYLATED HEMOGLOBIN (HGB A1C): HEMOGLOBIN A1C: 5.2 % (ref 4.0–5.6)

## 2018-06-12 MED ORDER — VICTOZA 18 MG/3ML ~~LOC~~ SOPN: PEN_INJECTOR | SUBCUTANEOUS | 4 refills | Status: DC

## 2018-06-12 NOTE — Progress Notes (Signed)
[facility] [place], [place] [address]  Internal MEDICINE  Office Visit Note  Patient Name: Michele Bartlett  [id number]  [id number]  Date of Service: 06/12/2018  Chief Complaint  Patient presents with  . Hypertension  . Diabetes    Diabetes  She presents for her follow-up diabetic visit. She has type 2 diabetes mellitus. No MedicAlert identification noted. Her disease course has been stable. There are no hypoglycemic associated symptoms. Pertinent negatives for hypoglycemia include no dizziness or headaches. There are no diabetic associated symptoms. Pertinent negatives for diabetes include no chest pain, no fatigue, no polydipsia, no polyphagia and no polyuria. There are no hypoglycemic complications. Symptoms are stable. Diabetic complications include nephropathy. Risk factors for coronary artery disease include dyslipidemia, diabetes mellitus, hypertension, obesity and sedentary lifestyle. Current diabetic treatments: victoza injections. She is compliant with treatment all of the time. Her weight is decreasing steadily. She is following a generally healthy diet. Meal planning includes avoidance of concentrated sweets. She has had a previous visit with a dietitian. There is no change in her home blood glucose trend. An ACE inhibitor/angiotensin II receptor blocker is being taken. She sees a podiatrist.Eye exam is current.       Current Medication: Outpatient Encounter Medications as of 06/12/2018  Medication Sig  . albuterol (PROAIR HFA) 108 (90 Base) MCG/ACT inhaler Inhale 2 puffs into the lungs every 6 (six) hours as needed. For  Wheezing.  Marland Kitchen albuterol (PROVENTIL) (2.5 MG/3ML) 0.083% nebulizer solution Take 2.5 mg by nebulization every 6 (six) hours as needed for wheezing or shortness of breath.  . allopurinol (ZYLOPRIM) 100 MG tablet Take 100 mg by mouth daily.  Marland Kitchen azithromycin (ZITHROMAX) 250 MG tablet Take one tab po qd for 7 days  . benazepril (LOTENSIN) 40  MG tablet Take 40 mg by mouth daily.  . colchicine 0.6 MG tablet Take 0.6 mg by mouth 2 (two) times daily.  Marland Kitchen Dextromethorphan-Guaifenesin (MUCINEX DM MAXIMUM STRENGTH) 60-1200 MG TB12 Take 1 tablet by mouth every 12 (twelve) hours.  . fluticasone (FLONASE) 50 MCG/ACT nasal spray Place 1 spray into both nostrils 2 (two) times daily as needed for allergies or rhinitis. Reported on 11/30/2015  . furosemide (LASIX) 40 MG tablet TAKE 1 TABLET EVERY OTHER DAY  . levocetirizine (XYZAL) 5 MG tablet Take 1 tablet (5 mg total) by mouth daily.  . metoprolol tartrate (LOPRESSOR) 25 MG tablet TAKE 1 TABLET TWICE A DAY  . mometasone-formoterol (DULERA) 100-5 MCG/ACT AERO Inhale 2 puffs into the lungs every 12 (twelve) hours as needed for wheezing or shortness of breath.  Marland Kitchen NOVOFINE 30G X 8 MM MISC   . NOVOFINE 32G X 6 MM MISC USE AS DIRECTED  . omeprazole (PRILOSEC) 20 MG capsule Take 20 mg by mouth daily.  . OXYGEN Inhale 2 L/min into the lungs at bedtime.  Marland Kitchen Spacer/Aero-Holding Chambers (AEROCHAMBER PLUS WITH MASK) inhaler 1 each by Other route once. Use as instructed  . VELTASSA 8.4 g packet   . VICTOZA 18 MG/3ML SOPN Inject 1.2 mg daily  . [DISCONTINUED] VICTOZA 18 MG/3ML SOPN Inject 1.2 mg daily   No facility-administered encounter medications on file as of 06/12/2018.     Surgical History: Past Surgical History:  Procedure Laterality Date  . BREAST BIOPSY Left 03/22/2018   affirm stero biopsy x 2 areas/path pending    Medical History: Past Medical History:  Diagnosis Date  . Asthma   . Diabetes mellitus without complication ([place])   . Down's syndrome   .  Environmental allergies   . Hypertension   . Kidney disease   . Nephrotic syndrome   . Sleep apnea     Family History: Family History  Problem Relation Age of Onset  . Diabetes Mellitus II Mother   . CAD Father   . Hypertension Father     Social History   Socioeconomic History  . Marital status: Single    Spouse name: Not on  file  . Number of children: Not on file  . Years of education: Not on file  . Highest education level: Not on file  Occupational History  . Not on file  Social Needs  . Financial resource strain: Not on file  . Food insecurity:    Worry: Not on file    Inability: Not on file  . Transportation needs:    Medical: Not on file    Non-medical: Not on file  Tobacco Use  . Smoking status: Never Smoker  . Smokeless tobacco: Never Used  Substance and Sexual Activity  . Alcohol use: No  . Drug use: No  . Sexual activity: Not on file  Lifestyle  . Physical activity:    Days per week: Not on file    Minutes per session: Not on file  . Stress: Not on file  Relationships  . Social connections:    Talks on phone: Not on file    Gets together: Not on file    Attends religious service: Not on file    Active member of club or organization: Not on file    Attends meetings of clubs or organizations: Not on file    Relationship status: Not on file  . Intimate partner violence:    Fear of current or ex partner: Not on file    Emotionally abused: Not on file    Physically abused: Not on file    Forced sexual activity: Not on file  Other Topics Concern  . Not on file  Social History Narrative  . Not on file      Review of Systems  Constitutional: Negative for activity change, chills, diaphoresis, fatigue and unexpected weight change.  HENT: Negative for ear pain, postnasal drip, sinus pressure and voice change.   Eyes: Negative for photophobia, discharge, redness, itching and visual disturbance.       Cataract development which will be removed.   Respiratory: Negative for cough, shortness of breath and wheezing.   Cardiovascular: Positive for leg swelling. Negative for chest pain and palpitations.  Gastrointestinal: Negative for abdominal pain, constipation, diarrhea, nausea and vomiting.  Endocrine: Negative for cold intolerance, heat intolerance, polydipsia, polyphagia and polyuria.        Blood sugars doing well   Genitourinary: Negative.  Negative for dysuria and flank pain.  Musculoskeletal: Negative for arthralgias, back pain, gait problem and neck pain.  Skin: Negative for color change and rash.  Allergic/Immunologic: Positive for environmental allergies. Negative for food allergies.  Neurological: Negative for dizziness and headaches.  Hematological: Negative for adenopathy. Does not bruise/bleed easily.  Psychiatric/Behavioral: Negative for agitation, behavioral problems (depression) and hallucinations.    Today's Vitals   06/12/18 1054  BP: (!) 142/80  Pulse: 86  Resp: 16  SpO2: 92%  Weight: 265 lb (120.2 kg)  Height: 5' 2.5" (1.588 m)    Physical Exam  Constitutional: She is oriented to person, place, and time. She appears well-developed and well-nourished. No distress.  HENT:  Head: Normocephalic and atraumatic.  Nose: Nose normal.  Mouth/Throat: Oropharynx is clear  and moist. No oropharyngeal exudate.  Eyes: Pupils are equal, round, and reactive to light. Conjunctivae and EOM are normal.  Neck: Normal range of motion. Neck supple. No JVD present. No tracheal deviation present. No thyromegaly present.  Cardiovascular: Normal rate, regular rhythm and normal heart sounds. Exam reveals no gallop and no friction rub.  No murmur heard. Bilateral, non-pitting edema in bilateral lower extremities.   Pulmonary/Chest: Effort normal and breath sounds normal. No respiratory distress. She has no wheezes. She has no rales. She exhibits no tenderness.  Abdominal: Soft. Bowel sounds are normal. There is no tenderness.  Musculoskeletal: Normal range of motion.  Lymphadenopathy:    She has no cervical adenopathy.  Neurological: She is alert and oriented to person, place, and time. No cranial nerve deficit.  Patient is at her neurological baseline.  Skin: Skin is warm and dry. Capillary refill takes less than 2 seconds. She is not diaphoretic.  Psychiatric: She  has a normal mood and affect. Her behavior is normal. Judgment and thought content normal.  Nursing note and vitals reviewed.  Assessment/Plan: 1. Type 2 diabetes mellitus with stage 2 chronic kidney disease, without long-term current use of insulin (HCC) - POCT HgB A1C 5.2 today. Continue victoza as prescribed. Regular visits with nephrology as scheduled.  - VICTOZA 18 MG/3ML SOPN; Inject 1.2 mg daily  Dispense: 9 pen; Refill: 4  2. Essential (primary) hypertension Generally stable. Continue all bp medication as prescribed.   3. Moderate persistent asthma, uncomplicated Inhalers and respiratory medications to be continued as scheduled.   4. Morbid (severe) obesity due to excess calories (HCC) Discussed low-impact exercise regimen. Patient has treadmill at home. Suggested she start with 5 minutes and increase in 5 minute intervals as tolerated.   5. Flu vaccine need - Flu Vaccine MDCK QUAD PF  General Counseling: Michele Bartlett verbalizes understanding of the findings of todays visit and agrees with plan of treatment. I have discussed any further diagnostic evaluation that may be needed or ordered today. We also reviewed her medications today. she has been encouraged to call the office with any questions or concerns that should arise related to todays visit.  Hypertension Counseling:   The following hypertensive lifestyle modification were recommended and discussed:  1. Limiting alcohol intake to less than 1 oz/day of ethanol:(24 oz of beer or 8 oz of wine or 2 oz of 100-proof whiskey). 2. Take baby ASA 81 mg daily. 3. Importance of regular aerobic exercise and losing weight. 4. Reduce dietary saturated fat and cholesterol intake for overall cardiovascular health. 5. Maintaining adequate dietary potassium, calcium, and magnesium intake. 6. Regular monitoring of the blood pressure. 7. Reduce sodium intake to less than 100 mmol/day (less than 2.3 gm of sodium or less than 6 gm of sodium choride)    This patient was seen by [place] with Dr Lavera Guise as a part of collaborative care agreement  Orders Placed This Encounter  Procedures  . Flu Vaccine MDCK QUAD PF  . POCT HgB A1C    Meds ordered this encounter  Medications  . VICTOZA 18 MG/3ML SOPN    Sig: Inject 1.2 mg daily    Dispense:  9 pen    Refill:  4    This should be 90 day prescription.    Order Specific Question:   Supervising Provider    Answer:   Lavera Guise [[id number]]    Time spent: 25 Minutes      Dr Lavera Guise  Internal medicine

## 2018-06-13 ENCOUNTER — Encounter: Payer: Self-pay | Admitting: *Deleted

## 2018-06-15 ENCOUNTER — Ambulatory Visit
Admission: RE | Admit: 2018-06-15 | Discharge: 2018-06-15 | Disposition: A | Discharge status: Home / Self Care | Payer: Medicare Other | Source: Ambulatory Visit | Attending: Ophthalmology | Admitting: Ophthalmology

## 2018-06-15 ENCOUNTER — Other Ambulatory Visit: Payer: Self-pay

## 2018-06-15 ENCOUNTER — Ambulatory Visit: Payer: Medicare Other | Admitting: Anesthesiology

## 2018-06-15 ENCOUNTER — Encounter
Admission: RE | Disposition: A | Discharge status: Home / Self Care | Payer: Self-pay | Source: Ambulatory Visit | Attending: Ophthalmology

## 2018-06-15 DIAGNOSIS — I129 Hypertensive chronic kidney disease with stage 1 through stage 4 chronic kidney disease, or unspecified chronic kidney disease: Secondary | ICD-10-CM | POA: Insufficient documentation

## 2018-06-15 DIAGNOSIS — N184 Chronic kidney disease, stage 4 (severe): Secondary | ICD-10-CM | POA: Insufficient documentation

## 2018-06-15 DIAGNOSIS — E1136 Type 2 diabetes mellitus with diabetic cataract: Secondary | ICD-10-CM | POA: Insufficient documentation

## 2018-06-15 DIAGNOSIS — G4730 Sleep apnea, unspecified: Secondary | ICD-10-CM | POA: Insufficient documentation

## 2018-06-15 DIAGNOSIS — H2512 Age-related nuclear cataract, left eye: Secondary | ICD-10-CM | POA: Insufficient documentation

## 2018-06-15 DIAGNOSIS — Q909 Down syndrome, unspecified: Secondary | ICD-10-CM | POA: Diagnosis not present

## 2018-06-15 DIAGNOSIS — Z6841 Body Mass Index (BMI) 40.0 and over, adult: Secondary | ICD-10-CM | POA: Insufficient documentation

## 2018-06-15 DIAGNOSIS — E1122 Type 2 diabetes mellitus with diabetic chronic kidney disease: Secondary | ICD-10-CM | POA: Insufficient documentation

## 2018-06-15 HISTORY — DX: Wheezing: R06.2

## 2018-06-15 HISTORY — DX: Edema, unspecified: R60.9

## 2018-06-15 HISTORY — DX: Cardiac arrhythmia, unspecified: I49.9

## 2018-06-15 HISTORY — PX: CATARACT EXTRACTION W/PHACO: SHX586

## 2018-06-15 LAB — GLUCOSE, CAPILLARY: Glucose-Capillary: 94 mg/dL (ref 70–99)

## 2018-06-15 LAB — POCT PREGNANCY, URINE: PREG TEST UR: NEGATIVE

## 2018-06-15 SURGERY — PHACOEMULSIFICATION, CATARACT, WITH IOL INSERTION
Anesthesia: Monitor Anesthesia Care | Site: Eye | Laterality: Left

## 2018-06-15 MED ORDER — LIDOCAINE HCL (PF) 4 % IJ SOLN
INTRAOCULAR | Status: DC | PRN
  Administered 2018-06-15: 2 mL via OPHTHALMIC

## 2018-06-15 MED ORDER — LIDOCAINE HCL (PF) 4 % IJ SOLN
INTRAMUSCULAR | Status: AC
  Filled 2018-06-15: qty 5

## 2018-06-15 MED ORDER — NA HYALUR & NA CHOND-NA HYALUR 0.4-0.35 ML IO KIT
PACK | INTRAOCULAR | Status: DC | PRN
  Administered 2018-06-15: 1 mL via INTRAOCULAR

## 2018-06-15 MED ORDER — TRYPAN BLUE 0.06 % OP SOLN
OPHTHALMIC | Status: AC
  Filled 2018-06-15: qty 0.5

## 2018-06-15 MED ORDER — ARMC OPHTHALMIC DILATING DROPS
1.0000 "application " | OPHTHALMIC | Status: AC
  Administered 2018-06-15 (×3): 1 via OPHTHALMIC

## 2018-06-15 MED ORDER — BUPIVACAINE HCL (PF) 0.75 % IJ SOLN
INTRAMUSCULAR | Status: AC
  Filled 2018-06-15: qty 10

## 2018-06-15 MED ORDER — MIDAZOLAM HCL 2 MG/2ML IJ SOLN
INTRAMUSCULAR | Status: AC
  Filled 2018-06-15: qty 2

## 2018-06-15 MED ORDER — POVIDONE-IODINE 5 % OP SOLN
OPHTHALMIC | Status: DC | PRN
  Administered 2018-06-15: 1 via OPHTHALMIC

## 2018-06-15 MED ORDER — ARMC OPHTHALMIC DILATING DROPS
OPHTHALMIC | Status: AC
  Administered 2018-06-15: 1 via OPHTHALMIC
  Filled 2018-06-15: qty 0.5

## 2018-06-15 MED ORDER — POVIDONE-IODINE 5 % OP SOLN
OPHTHALMIC | Status: AC
  Filled 2018-06-15: qty 30

## 2018-06-15 MED ORDER — SODIUM CHLORIDE 0.9 % IV SOLN
INTRAVENOUS | Status: DC
  Administered 2018-06-15: 07:00:00 via INTRAVENOUS

## 2018-06-15 MED ORDER — EPINEPHRINE PF 1 MG/ML IJ SOLN
INTRAOCULAR | Status: DC | PRN
  Administered 2018-06-15: 1 mL via OPHTHALMIC

## 2018-06-15 MED ORDER — EPINEPHRINE PF 1 MG/ML IJ SOLN
INTRAMUSCULAR | Status: AC
  Filled 2018-06-15: qty 1

## 2018-06-15 MED ORDER — NEOMYCIN-POLYMYXIN-DEXAMETH 0.1 % OP OINT
TOPICAL_OINTMENT | OPHTHALMIC | Status: DC | PRN
  Administered 2018-06-15: 1 via OPHTHALMIC

## 2018-06-15 MED ORDER — MOXIFLOXACIN HCL 0.5 % OP SOLN
OPHTHALMIC | Status: AC
  Administered 2018-06-15: 1 [drp] via OPHTHALMIC
  Filled 2018-06-15: qty 3

## 2018-06-15 MED ORDER — FENTANYL CITRATE (PF) 100 MCG/2ML IJ SOLN
INTRAMUSCULAR | Status: AC
  Filled 2018-06-15: qty 2

## 2018-06-15 MED ORDER — NEOMYCIN-POLYMYXIN-DEXAMETH 3.5-10000-0.1 OP OINT
TOPICAL_OINTMENT | OPHTHALMIC | Status: AC
  Filled 2018-06-15: qty 3.5

## 2018-06-15 MED ORDER — MIDAZOLAM HCL 2 MG/2ML IJ SOLN
INTRAMUSCULAR | Status: DC | PRN
  Administered 2018-06-15 (×2): 1 mg via INTRAVENOUS

## 2018-06-15 MED ORDER — CARBACHOL 0.01 % IO SOLN
INTRAOCULAR | Status: DC | PRN
  Administered 2018-06-15: .5 mL via INTRAOCULAR

## 2018-06-15 MED ORDER — MOXIFLOXACIN HCL 0.5 % OP SOLN
1.0000 [drp] | OPHTHALMIC | Status: AC
  Administered 2018-06-15 (×3): 1 [drp] via OPHTHALMIC

## 2018-06-15 MED ORDER — HYALURONIDASE HUMAN 150 UNIT/ML IJ SOLN
INTRAMUSCULAR | Status: AC
  Filled 2018-06-15: qty 1

## 2018-06-15 MED ORDER — MOXIFLOXACIN HCL 0.5 % OP SOLN
OPHTHALMIC | Status: AC
  Filled 2018-06-15: qty 3

## 2018-06-15 SURGICAL SUPPLY — 18 items
GLOVE BIO SURGEON STRL SZ8 (GLOVE) ×2 IMPLANT
GLOVE BIOGEL M 6.5 STRL (GLOVE) ×2 IMPLANT
GLOVE SURG LX 7.5 STRW (GLOVE) ×1
GLOVE SURG LX STRL 7.5 STRW (GLOVE) ×1 IMPLANT
GOWN STRL REUS W/ TWL LRG LVL3 (GOWN DISPOSABLE) ×2 IMPLANT
GOWN STRL REUS W/TWL LRG LVL3 (GOWN DISPOSABLE) ×2
LABEL CATARACT MEDS ST (LABEL) ×2 IMPLANT
LENS IOL TECNIS ITEC 25.0 (Intraocular Lens) ×2 IMPLANT
NDL HPO THNWL 1X22GA REG BVL (NEEDLE) ×1 IMPLANT
NEEDLE SAFETY 22GX1 (NEEDLE) ×1
PACK CATARACT (MISCELLANEOUS) ×2 IMPLANT
PACK CATARACT BRASINGTON LX (MISCELLANEOUS) ×2 IMPLANT
PACK EYE AFTER SURG (MISCELLANEOUS) ×2 IMPLANT
SOL BSS BAG (MISCELLANEOUS) ×2
SOLUTION BSS BAG (MISCELLANEOUS) ×1 IMPLANT
SYR 5ML LL (SYRINGE) ×2 IMPLANT
WATER STERILE IRR 250ML POUR (IV SOLUTION) ×2 IMPLANT
WIPE NON LINTING 3.25X3.25 (MISCELLANEOUS) ×2 IMPLANT

## 2018-06-15 NOTE — OR Nursing (Signed)
Dr Rosey Bath aware of last dose of metoprolol 06/14/18 and vs.  No new orders.

## 2018-06-15 NOTE — Anesthesia Postprocedure Evaluation (Signed)
Anesthesia Post Note  Patient: Fatemah Pourciau  Procedure(s) Performed: CATARACT EXTRACTION PHACO AND INTRAOCULAR LENS PLACEMENT ([place]) (Left Eye)  Patient location during evaluation: Short Stay Anesthesia Type: MAC Level of consciousness: awake and alert Pain management: pain level controlled Vital Signs Assessment: post-procedure vital signs reviewed and stable Respiratory status: spontaneous breathing, nonlabored ventilation, respiratory function stable and patient connected to nasal cannula oxygen Cardiovascular status: stable and blood pressure returned to baseline Postop Assessment: no apparent nausea or vomiting Anesthetic complications: no     Last Vitals:  Vitals:   06/15/18 0609 06/15/18 0650  BP: (!) 119/53   Pulse: (!) 125 (!) 105  Resp: 18   Temp: (!) 36 C   SpO2: 100%     Last Pain:  Vitals:   06/15/18 0609  TempSrc: Temporal  PainSc: 0-No pain                 Arielys Wandersee,  Clearnce Sorrel

## 2018-06-15 NOTE — H&P (Signed)
The History and Physical notes are on paper, have been signed, and are to be scanned. The patient remains stable and unchanged from the H&P.   Previous H&P reviewed, patient examined, and there are no changes.  Apolonio Cutting 06/15/2018 7:29 AM

## 2018-06-15 NOTE — Anesthesia Post-op Follow-up Note (Signed)
Anesthesia QCDR form completed.        

## 2018-06-15 NOTE — Op Note (Signed)
OPERATIVE NOTE  Michele Bartlett [id number] 06/15/2018   PREOPERATIVE DIAGNOSIS:  Nuclear sclerotic cataract left eye. H25.12   POSTOPERATIVE DIAGNOSIS:    Nuclear sclerotic cataract left eye.     PROCEDURE:  Phacoemusification with posterior chamber intraocular lens placement of the left eye   LENS:   Implant Name Type Inv. Item Serial No. Manufacturer Lot No. LRB No. Used  LENS IOL DIOP 25.0 - [id number] Intraocular Lens LENS IOL DIOP 25.0 [id number]  Left 1        ULTRASOUND TIME: 10 % of 1 minutes, 35 seconds.  CDE 12.6   SURGEON:  Wyonia Hough, MD   ANESTHESIA:  Topical with tetracaine drops and 2% Xylocaine jelly, augmented with 1% preservative-free intracameral lidocaine.    COMPLICATIONS:  None.   DESCRIPTION OF PROCEDURE:  The patient was identified in the holding room and transported to the operating room and placed in the supine position under the operating microscope.  The left eye was identified as the operative eye and it was prepped and draped in the usual sterile ophthalmic fashion.   A 1 millimeter clear-corneal paracentesis was made at the 1:30 position. 0.5 ml of preservative-free 1% lidocaine was injected into the anterior chamber.  The anterior chamber was filled with Viscoat viscoelastic.  A 2.4 millimeter keratome was used to make a near-clear corneal incision at the 10:30 position.  .  A curvilinear capsulorrhexis was made with a cystotome and capsulorrhexis forceps.  Balanced salt solution was used to hydrodissect and hydrodelineate the nucleus.   Phacoemulsification was then used in stop and chop fashion to remove the lens nucleus and epinucleus.  The remaining cortex was then removed using the irrigation and aspiration handpiece. Provisc was then placed into the capsular bag to distend it for lens placement.  A lens was then injected into the capsular bag.  The remaining viscoelastic was aspirated.   Wounds were hydrated with balanced salt  solution.  The anterior chamber was inflated to a physiologic pressure with balanced salt solution. Vigamox 0.2 ml of a 1mg  per ml solution was injected into the anterior chamber for a dose of 0.2 mg of intracameral antibiotic at the completion of the case.  Miostat was placed into the anterior chamber to constrict the pupil.  No wound leaks were noted.  Topical Vigamox drops and Maxitrol ointment were applied to the eye.  The patient was taken to the recovery room in stable condition without complications of anesthesia or surgery  [name] 06/15/2018, 8:04 AM

## 2018-06-15 NOTE — Anesthesia Preprocedure Evaluation (Signed)
Anesthesia Evaluation  Patient identified by MRN, date of birth, ID band Patient awake    Reviewed: Allergy & Precautions, H&P , NPO status , Patient's Chart, lab work & pertinent test results, reviewed documented beta blocker date and time   Airway Mallampati: IV  TM Distance: >3 FB Neck ROM: full    Dental  (+) Dental Advidsory Given, Teeth Intact   Pulmonary neg pulmonary ROS, neg shortness of breath, asthma , sleep apnea and Oxygen sleep apnea , neg COPD, neg recent URI,           Cardiovascular Exercise Tolerance: Good hypertension, (-) angina(-) CAD, (-) Past MI, (-) Cardiac Stents and (-) CABG negative cardio ROS  + dysrhythmias (-) Valvular Problems/Murmurs     Neuro/Psych negative neurological ROS  negative psych ROS   GI/Hepatic Neg liver ROS, GERD  ,  Endo/Other  diabetesMorbid obesity  Renal/GU CRFRenal disease  negative genitourinary   Musculoskeletal   Abdominal   Peds  Hematology negative hematology ROS (+)   Anesthesia Other Findings Past Medical History: No date: Asthma No date: Diabetes mellitus without complication (HCC) No date: Down's syndrome No date: Dysrhythmia No date: Edema     Comment:  FEET/LEG No date: Environmental allergies No date: Hypertension No date: Kidney disease     Comment:  STAGE 4 No date: Nephrotic syndrome No date: Sleep apnea No date: Wheezing   Reproductive/Obstetrics negative OB ROS                             Anesthesia Physical Anesthesia Plan  ASA: III  Anesthesia Plan: MAC   Post-op Pain Management:    Induction:   PONV Risk Score and Plan:   Airway Management Planned:   Additional Equipment:   Intra-op Plan:   Post-operative Plan:   Informed Consent: I have reviewed the patients History and Physical, chart, labs and discussed the procedure including the risks, benefits and alternatives for the proposed anesthesia  with the patient or authorized representative who has indicated his/her understanding and acceptance.   Dental Advisory Given  Plan Discussed with: Anesthesiologist, CRNA and Surgeon  Anesthesia Plan Comments:         Anesthesia Quick Evaluation

## 2018-06-15 NOTE — Transfer of Care (Signed)
Immediate Anesthesia Transfer of Care Note  Patient: Michele Bartlett  Procedure(s) Performed: CATARACT EXTRACTION PHACO AND INTRAOCULAR LENS PLACEMENT ([facility]) (Left Eye)  Patient Location: Short Stay  Anesthesia Type:MAC  Level of Consciousness: awake, alert  and oriented  Airway & Oxygen Therapy: Patient Spontanous Breathing  Post-op Assessment: Post -op Vital signs reviewed and stable  Post vital signs: stable  Last Vitals:  Vitals Value Taken Time  BP    Temp    Pulse    Resp    SpO2      Last Pain:  Vitals:   06/15/18 0609  TempSrc: Temporal  PainSc: 0-No pain         Complications: No apparent anesthesia complications

## 2018-06-15 NOTE — Progress Notes (Signed)
Ok per Dr. Star Age to use ophthalmic drops despite allergy listed to novacaine.

## 2018-06-15 NOTE — Discharge Instructions (Signed)

## 2018-06-16 ENCOUNTER — Other Ambulatory Visit: Payer: Self-pay

## 2018-06-16 ENCOUNTER — Encounter: Payer: Self-pay | Admitting: Ophthalmology

## 2018-06-16 DIAGNOSIS — N182 Chronic kidney disease, stage 2 (mild): Principal | ICD-10-CM

## 2018-06-16 DIAGNOSIS — E1122 Type 2 diabetes mellitus with diabetic chronic kidney disease: Secondary | ICD-10-CM

## 2018-06-16 MED ORDER — VICTOZA 18 MG/3ML ~~LOC~~ SOPN: 1.8000 mg | PEN_INJECTOR | SUBCUTANEOUS | 3 refills | Status: AC

## 2018-06-20 ENCOUNTER — Encounter: Payer: Self-pay | Admitting: Nurse Practitioner

## 2018-06-20 NOTE — Progress Notes (Signed)
SCANNED IN DIABETIC EYE EXAM RESULTS.

## 2018-09-12 ENCOUNTER — Ambulatory Visit: Payer: Self-pay | Admitting: Nurse Practitioner

## 2018-10-04 ENCOUNTER — Encounter (INDEPENDENT_AMBULATORY_CARE_PROVIDER_SITE_OTHER): Payer: Self-pay | Admitting: Vascular Surgery

## 2018-10-04 ENCOUNTER — Ambulatory Visit (INDEPENDENT_AMBULATORY_CARE_PROVIDER_SITE_OTHER): Payer: Medicare Other

## 2018-10-04 ENCOUNTER — Other Ambulatory Visit (INDEPENDENT_AMBULATORY_CARE_PROVIDER_SITE_OTHER): Payer: Self-pay | Admitting: Vascular Surgery

## 2018-10-04 ENCOUNTER — Ambulatory Visit (INDEPENDENT_AMBULATORY_CARE_PROVIDER_SITE_OTHER): Payer: Medicare Other | Admitting: Vascular Surgery

## 2018-10-04 VITALS — Resp 18 | Ht 61.0 in | Wt 264.6 lb

## 2018-10-04 DIAGNOSIS — I10 Essential (primary) hypertension: Secondary | ICD-10-CM

## 2018-10-04 DIAGNOSIS — N182 Chronic kidney disease, stage 2 (mild): Secondary | ICD-10-CM

## 2018-10-04 DIAGNOSIS — N184 Chronic kidney disease, stage 4 (severe): Secondary | ICD-10-CM | POA: Diagnosis not present

## 2018-10-04 DIAGNOSIS — N186 End stage renal disease: Secondary | ICD-10-CM

## 2018-10-04 DIAGNOSIS — E1122 Type 2 diabetes mellitus with diabetic chronic kidney disease: Secondary | ICD-10-CM

## 2018-10-04 DIAGNOSIS — I129 Hypertensive chronic kidney disease with stage 1 through stage 4 chronic kidney disease, or unspecified chronic kidney disease: Secondary | ICD-10-CM

## 2018-10-04 NOTE — Progress Notes (Signed)
Subjective:    Patient ID: Michele Bartlett, female    DOB: 26-Jan-1971, 48 y.o.   MRN: [id number] Chief Complaint  Patient presents with  . New Patient (Initial Visit)   Presents as a new patient referred by Dr. Holley Raring for evaluation of "dialysis access creation".  The patient has a past medical history of known chronic kidney disease now progressed to stage IV.  Most recent GFR: 17 with urine protein to creatinine ratio 2.0.  At this time, the patient denies any uremic symptoms.  The patient was referred by her nephrologist in an attempt to prepare for worsening kidney function status and the possibility of requiring dialysis in the near future.  The patient is right-hand dominant.  The patient underwent a bilateral upper extremity vein mapping which was amenable to the creation of a left radio cephalic AV fistula.  The patient denies any fever, nausea or vomiting.  Review of Systems  Constitutional: Negative.   HENT: Negative.   Eyes: Negative.   Respiratory: Negative.   Cardiovascular: Negative.   Gastrointestinal: Negative.   Endocrine: Negative.   Genitourinary:       Renal Disease  Musculoskeletal: Negative.   Skin: Negative.   Allergic/Immunologic: Negative.   Neurological: Negative.   Hematological: Negative.   Psychiatric/Behavioral: Negative.       Objective:   Physical Exam Vitals signs reviewed.  Constitutional:      Appearance: Normal appearance.  HENT:     Head: Normocephalic and atraumatic.     Right Ear: External ear normal.     Left Ear: External ear normal.     Nose: Nose normal.     Mouth/Throat:     Mouth: Mucous membranes are dry.     Pharynx: Oropharynx is clear.  Eyes:     Conjunctiva/sclera: Conjunctivae normal.     Pupils: Pupils are equal, round, and reactive to light.  Neck:     Musculoskeletal: Normal range of motion.  Cardiovascular:     Rate and Rhythm: Normal rate and regular rhythm.     Pulses: Normal pulses.  Pulmonary:     Effort:  Pulmonary effort is normal.     Breath sounds: Normal breath sounds.  Musculoskeletal: Normal range of motion.        General: No swelling.  Skin:    General: Skin is warm and dry.  Neurological:     General: No focal deficit present.     Mental Status: She is alert and oriented to person, place, and time.  Psychiatric:        Mood and Affect: Mood normal.        Behavior: Behavior normal.        Thought Content: Thought content normal.        Judgment: Judgment normal.    Resp 18   Ht 5\' 1"  (1.549 m)   Wt 264 lb 9.6 oz (120 kg)   BMI 50.00 kg/m   Past Medical History:  Diagnosis Date  . Asthma   . Diabetes mellitus without complication ([place])   . Down's syndrome   . Dysrhythmia   . Edema    FEET/LEG  . Environmental allergies   . Hypertension   . Kidney disease    STAGE 4  . Nephrotic syndrome   . Sleep apnea   . Wheezing    Social History   Socioeconomic History  . Marital status: Single    Spouse name: Not on file  . Number of children: Not on file  .  Years of education: Not on file  . Highest education level: Not on file  Occupational History  . Not on file  Social Needs  . Financial resource strain: Not on file  . Food insecurity:    Worry: Not on file    Inability: Not on file  . Transportation needs:    Medical: Not on file    Non-medical: Not on file  Tobacco Use  . Smoking status: Never Smoker  . Smokeless tobacco: Never Used  Substance and Sexual Activity  . Alcohol use: No  . Drug use: No  . Sexual activity: Not on file  Lifestyle  . Physical activity:    Days per week: Not on file    Minutes per session: Not on file  . Stress: Not on file  Relationships  . Social connections:    Talks on phone: Not on file    Gets together: Not on file    Attends religious service: Not on file    Active member of club or organization: Not on file    Attends meetings of clubs or organizations: Not on file    Relationship status: Not on file  .  Intimate partner violence:    Fear of current or ex partner: Not on file    Emotionally abused: Not on file    Physically abused: Not on file    Forced sexual activity: Not on file  Other Topics Concern  . Not on file  Social History Narrative  . Not on file   Past Surgical History:  Procedure Laterality Date  . BREAST BIOPSY Left 03/22/2018   affirm stero biopsy x 2 areas/path pending  . CATARACT EXTRACTION W/PHACO Left 06/15/2018   Procedure: CATARACT EXTRACTION PHACO AND INTRAOCULAR LENS PLACEMENT (IOC);  Surgeon: Leandrew Koyanagi, MD;  Location: [facility] ORS;  Service: Ophthalmology;  Laterality: Left;  [place] 01:35 AP% 9.9 CDE 12.6 Fluid pack lot # [id number]   Family History  Problem Relation Age of Onset  . Diabetes Mellitus II Mother   . CAD Father   . Hypertension Father    Allergies  Allergen Reactions  . Novocain [Procaine] Rash    Other reaction(s): Other (See Comments) Skin peel raw all over      Assessment & Plan:  Presents as a new patient referred by Dr. Holley Raring for evaluation of "dialysis access creation".  The patient has a past medical history of known chronic kidney disease now progressed to stage IV.  Most recent GFR: 17 with urine protein to creatinine ratio 2.0.  At this time, the patient denies any uremic symptoms.  The patient was referred by her nephrologist in an attempt to prepare for worsening kidney function status and the possibility of requiring dialysis in the near future.  The patient is right-hand dominant.  The patient underwent a bilateral upper extremity vein mapping which was amenable to the creation of a left radio cephalic AV fistula.  The patient denies any fever, nausea or vomiting.  1. Chronic kidney disease (CKD), stage IV (severe) ([place]) - New Patient presents with worsening kidney function status and possible need for dialysis in the near future At this time, the patient denies any uremic symptoms Bilateral upper extremity vein mapping  amenable for creation of a left radiocephalic AV fistula The patient is right-hand dominant Procedure, risks and benefits explained to the patient and her family member who was with her All questions were answered Both the patient and her family member would like to proceed  2. Essential (primary) hypertension - Stable On appropriate medications. Is followed by the patient's primary care physician/cardiologist Encouraged good control as its slows the progression of atherosclerotic and renal disease  3. Type 2 diabetes mellitus with stage 2 chronic kidney disease, without long-term current use of insulin (HCC) - Stable On appropriate medications. Is followed by the patient's primary care physician/endocrinologist Encouraged good control as its slows the progression of atherosclerotic and renal disease  Current Outpatient Medications on File Prior to Visit  Medication Sig Dispense Refill  . albuterol (PROAIR HFA) 108 (90 Base) MCG/ACT inhaler Inhale 2 puffs into the lungs every 6 (six) hours as needed. For  Wheezing.    Marland Kitchen albuterol (PROVENTIL) (2.5 MG/3ML) 0.083% nebulizer solution Take 2.5 mg by nebulization every 6 (six) hours as needed for wheezing or shortness of breath.    . benazepril (LOTENSIN) 40 MG tablet Take 40 mg by mouth daily.    . fluticasone (FLONASE) 50 MCG/ACT nasal spray Place 1 spray into both nostrils 2 (two) times daily as needed for allergies or rhinitis. Reported on 11/30/2015    . furosemide (LASIX) 40 MG tablet TAKE 1 TABLET EVERY OTHER DAY (Patient taking differently: Take 40 mg by mouth every other day. ) 54 tablet 3  . levocetirizine (XYZAL) 5 MG tablet Take 1 tablet (5 mg total) by mouth daily. 90 tablet 4  . metoprolol tartrate (LOPRESSOR) 25 MG tablet TAKE 1 TABLET TWICE A DAY (Patient taking differently: Take 25 mg by mouth 2 (two) times daily. ) 180 tablet 3  . NOVOFINE 30G X 8 MM MISC     . NOVOFINE 32G X 6 MM MISC USE AS DIRECTED 100 each 5  . omeprazole  (PRILOSEC) 20 MG capsule Take 20 mg by mouth daily as needed (heartburn).     . OXYGEN Inhale 2 L/min into the lungs at bedtime.    Marland Kitchen Spacer/Aero-Holding Chambers (AEROCHAMBER PLUS WITH MASK) inhaler 1 each by Other route once. Use as instructed    . VELTASSA 8.4 g packet Take 8.4 g by mouth every evening.     Marland Kitchen VICTOZA 18 MG/3ML SOPN Inject 0.3 mLs (1.8 mg total) into the skin every morning. 3 pen 3   No current facility-administered medications on file prior to visit.    There are no Patient Instructions on file for this visit. No follow-ups on file.  Consetta Cosner A Sumaiyah Markert, PA-C

## 2018-10-05 ENCOUNTER — Encounter (INDEPENDENT_AMBULATORY_CARE_PROVIDER_SITE_OTHER): Payer: Self-pay

## 2018-10-09 ENCOUNTER — Other Ambulatory Visit (INDEPENDENT_AMBULATORY_CARE_PROVIDER_SITE_OTHER): Payer: Self-pay | Admitting: Nurse Practitioner

## 2018-10-09 DIAGNOSIS — R000 Tachycardia, unspecified: Secondary | ICD-10-CM | POA: Insufficient documentation

## 2018-10-09 DIAGNOSIS — Z0001 Encounter for general adult medical examination with abnormal findings: Secondary | ICD-10-CM | POA: Insufficient documentation

## 2018-10-09 DIAGNOSIS — R600 Localized edema: Secondary | ICD-10-CM | POA: Insufficient documentation

## 2018-10-09 MED ORDER — CEFAZOLIN SODIUM-DEXTROSE 1-4 GM/50ML-% IV SOLN: 1.0000 g | Freq: Once | INTRAVENOUS | Status: DC

## 2018-10-11 ENCOUNTER — Encounter
Admission: RE | Disposition: A | Discharge status: Home / Self Care | Payer: Self-pay | Source: Home / Self Care | Attending: Vascular Surgery

## 2018-10-11 ENCOUNTER — Other Ambulatory Visit: Payer: Self-pay

## 2018-10-11 ENCOUNTER — Ambulatory Visit
Admission: RE | Admit: 2018-10-11 | Discharge: 2018-10-11 | Disposition: A | Discharge status: Home / Self Care | Payer: Medicare Other | Attending: Vascular Surgery | Admitting: Vascular Surgery

## 2018-10-11 DIAGNOSIS — N186 End stage renal disease: Secondary | ICD-10-CM | POA: Diagnosis not present

## 2018-10-11 DIAGNOSIS — I129 Hypertensive chronic kidney disease with stage 1 through stage 4 chronic kidney disease, or unspecified chronic kidney disease: Secondary | ICD-10-CM | POA: Diagnosis not present

## 2018-10-11 DIAGNOSIS — G4730 Sleep apnea, unspecified: Secondary | ICD-10-CM | POA: Insufficient documentation

## 2018-10-11 DIAGNOSIS — E1122 Type 2 diabetes mellitus with diabetic chronic kidney disease: Secondary | ICD-10-CM | POA: Insufficient documentation

## 2018-10-11 DIAGNOSIS — Q909 Down syndrome, unspecified: Secondary | ICD-10-CM | POA: Diagnosis not present

## 2018-10-11 DIAGNOSIS — R600 Localized edema: Secondary | ICD-10-CM | POA: Insufficient documentation

## 2018-10-11 DIAGNOSIS — N184 Chronic kidney disease, stage 4 (severe): Secondary | ICD-10-CM | POA: Diagnosis present

## 2018-10-11 DIAGNOSIS — Z8249 Family history of ischemic heart disease and other diseases of the circulatory system: Secondary | ICD-10-CM | POA: Insufficient documentation

## 2018-10-11 DIAGNOSIS — Z833 Family history of diabetes mellitus: Secondary | ICD-10-CM | POA: Insufficient documentation

## 2018-10-11 DIAGNOSIS — Z9981 Dependence on supplemental oxygen: Secondary | ICD-10-CM | POA: Diagnosis not present

## 2018-10-11 DIAGNOSIS — Z79899 Other long term (current) drug therapy: Secondary | ICD-10-CM | POA: Diagnosis not present

## 2018-10-11 DIAGNOSIS — I499 Cardiac arrhythmia, unspecified: Secondary | ICD-10-CM | POA: Insufficient documentation

## 2018-10-11 DIAGNOSIS — Z884 Allergy status to anesthetic agent status: Secondary | ICD-10-CM | POA: Insufficient documentation

## 2018-10-11 DIAGNOSIS — N189 Chronic kidney disease, unspecified: Secondary | ICD-10-CM

## 2018-10-11 HISTORY — PX: DIALYSIS/PERMA CATHETER INSERTION: CATH118288

## 2018-10-11 LAB — GLUCOSE, CAPILLARY
GLUCOSE-CAPILLARY: 77 mg/dL (ref 70–99)
Glucose-Capillary: 63 mg/dL — ABNORMAL LOW (ref 70–99)
Glucose-Capillary: 195 mg/dL — ABNORMAL HIGH (ref 70–99)
Glucose-Capillary: 108 mg/dL — ABNORMAL HIGH (ref 70–99)

## 2018-10-11 LAB — POTASSIUM (ARMC VASCULAR LAB ONLY): POTASSIUM (ARMC VASCULAR LAB): 4 (ref 3.5–5.1)

## 2018-10-11 SURGERY — DIALYSIS/PERMA CATHETER INSERTION
Anesthesia: Moderate Sedation

## 2018-10-11 MED ORDER — FENTANYL CITRATE (PF) 100 MCG/2ML IJ SOLN
INTRAMUSCULAR | Status: AC
  Filled 2018-10-11: qty 2

## 2018-10-11 MED ORDER — HEPARIN SODIUM (PORCINE) 10000 UNIT/ML IJ SOLN
INTRAMUSCULAR | Status: AC
  Filled 2018-10-11: qty 1

## 2018-10-11 MED ORDER — HYDROMORPHONE HCL 1 MG/ML IJ SOLN: 1.0000 mg | Freq: Once | INTRAMUSCULAR | Status: DC | PRN

## 2018-10-11 MED ORDER — LIDOCAINE-EPINEPHRINE (PF) 1 %-1:200000 IJ SOLN
INTRAMUSCULAR | Status: AC
  Filled 2018-10-11: qty 10

## 2018-10-11 MED ORDER — DEXTROSE 50 % IV SOLN
1.0000 | Freq: Once | INTRAVENOUS | Status: AC
  Administered 2018-10-11: 50 mL via INTRAVENOUS

## 2018-10-11 MED ORDER — MIDAZOLAM HCL 2 MG/2ML IJ SOLN
INTRAMUSCULAR | Status: DC | PRN
  Administered 2018-10-11: 2 mg via INTRAVENOUS

## 2018-10-11 MED ORDER — MIDAZOLAM HCL 2 MG/2ML IJ SOLN
INTRAMUSCULAR | Status: AC
  Filled 2018-10-11: qty 2

## 2018-10-11 MED ORDER — CEFAZOLIN SODIUM-DEXTROSE 1-4 GM/50ML-% IV SOLN
INTRAVENOUS | Status: AC
  Filled 2018-10-11: qty 50

## 2018-10-11 MED ORDER — FENTANYL CITRATE (PF) 100 MCG/2ML IJ SOLN
INTRAMUSCULAR | Status: DC | PRN
  Administered 2018-10-11: 50 ug via INTRAVENOUS

## 2018-10-11 MED ORDER — SODIUM CHLORIDE 0.9 % IV SOLN
INTRAVENOUS | Status: DC
  Administered 2018-10-11: 15:00:00 via INTRAVENOUS

## 2018-10-11 MED ORDER — ONDANSETRON HCL 4 MG/2ML IJ SOLN: 4.0000 mg | Freq: Four times a day (QID) | INTRAMUSCULAR | Status: DC | PRN

## 2018-10-11 MED ORDER — DEXTROSE 50 % IV SOLN
INTRAVENOUS | Status: AC
  Administered 2018-10-11: 50 mL via INTRAVENOUS
  Filled 2018-10-11: qty 50

## 2018-10-11 SURGICAL SUPPLY — 6 items
CATH PALINDROME RT-P 15FX19CM (CATHETERS) ×2 IMPLANT
DERMABOND ADVANCED (GAUZE/BANDAGES/DRESSINGS) ×2
DERMABOND ADVANCED .7 DNX12 (GAUZE/BANDAGES/DRESSINGS) IMPLANT
PACK ANGIOGRAPHY (CUSTOM PROCEDURE TRAY) ×2 IMPLANT
SUT MNCRL AB 4-0 PS2 18 (SUTURE) ×2 IMPLANT
SUT PROLENE 0 CT 1 30 (SUTURE) ×2 IMPLANT

## 2018-10-11 NOTE — Op Note (Signed)
OPERATIVE NOTE    PRE-OPERATIVE DIAGNOSIS: 1. ESRD  POST-OPERATIVE DIAGNOSIS: same as above  PROCEDURE: 1. Ultrasound guidance for vascular access to the right internal jugular vein 2. Fluoroscopic guidance for placement of catheter 3. Placement of a 19 cm tip to cuff tunneled hemodialysis catheter via the right internal jugular vein  SURGEON: Leotis Pain, MD  ANESTHESIA:  Local with Moderate conscious sedation for approximately 15 minutes using 2 mg of Versed and 50 mcg of Fentanyl  ESTIMATED BLOOD LOSS: 5 cc  FLUORO TIME: less than one minute  CONTRAST: none  FINDING(S): 1.  Patent right internal jugular vein  SPECIMEN(S):  None  INDICATIONS:   Michele Bartlett is a 48 y.o.female who presents with renal failure.  The patient needs long term dialysis access for their ESRD, and a Permcath is necessary.  Risks and benefits are discussed and informed consent is obtained.    DESCRIPTION: After obtaining full informed written consent, the patient was brought back to the vascular suited. The patient's right neck and chest were sterilely prepped and draped in a sterile surgical field was created. Moderate conscious sedation was administered during a face to face encounter with the patient throughout the procedure with my supervision of the RN administering medicines and monitoring the patient's vital signs, pulse oximetry, telemetry and mental status throughout from the start of the procedure until the patient was taken to the recovery room.  The right internal jugular vein was visualized with ultrasound and found to be patent. It was then accessed under direct ultrasound guidance and a permanent image was recorded. A wire was placed. After skin nick and dilatation, the peel-away sheath was placed over the wire. I then turned my attention to an area under the clavicle. Approximately 1-2 fingerbreadths below the clavicle a small counterincision was created and tunneled from the subclavicular  incision to the access site. Using fluoroscopic guidance, a 19 centimeter tip to cuff tunneled hemodialysis catheter was selected, and tunneled from the subclavicular incision to the access site. It was then placed through the peel-away sheath and the peel-away sheath was removed. Using fluoroscopic guidance the catheter tips were parked in the right atrium. The appropriate distal connectors were placed. It withdrew blood well and flushed easily with heparinized saline and a concentrated heparin solution was then placed. It was secured to the chest wall with 2 Prolene sutures. The access incision was closed single 4-0 Monocryl. A 4-0 Monocryl pursestring suture was placed around the exit site. Sterile dressings were placed. The patient tolerated the procedure well and was taken to the recovery room in stable condition.  COMPLICATIONS: None  CONDITION: Stable  Leotis Pain, MD 10/11/2018 4:19 PM   This note was created with Dragon Medical transcription system. Any errors in dictation are purely unintentional.

## 2018-10-11 NOTE — Progress Notes (Signed)
Ancef found not given in the back during procedure. Clarified with Dr. Lucky Cowboy if this should be given now, per Dr. Lucky Cowboy it should no longer be given as it would not be beneficial at this time.

## 2018-10-11 NOTE — H&P (Signed)
[facility] History & Physical Update  The patient was interviewed and re-examined.  The patient's previous History and Physical has been reviewed and is unchanged.  There is no change in the plan of care.  We have been asked by the nephrology service to place a permacath.  We plan to proceed with the scheduled procedure.  Leotis Pain, MD  10/11/2018, 3:43 PM

## 2018-10-11 NOTE — Progress Notes (Signed)
Confirmed with Dr. Lucky Cowboy that patient could be d/c home with elevated BP. Per Dr. Lucky Cowboy patient is okay to go home. Advised patient and family to take beta blocker and lasix once home, all are agreeable. Dr. Lucky Cowboy was also okay with this recommendation.

## 2018-10-12 ENCOUNTER — Encounter: Payer: Self-pay | Admitting: Vascular Surgery

## 2018-10-13 ENCOUNTER — Ambulatory Visit (INDEPENDENT_AMBULATORY_CARE_PROVIDER_SITE_OTHER): Payer: Medicare Other | Admitting: Nurse Practitioner

## 2018-10-13 ENCOUNTER — Encounter: Payer: Self-pay | Admitting: Nurse Practitioner

## 2018-10-13 VITALS — BP 98/68 | HR 106 | Resp 16 | Ht 62.5 in | Wt 261.6 lb

## 2018-10-13 DIAGNOSIS — J4520 Mild intermittent asthma, uncomplicated: Secondary | ICD-10-CM | POA: Diagnosis not present

## 2018-10-13 DIAGNOSIS — N186 End stage renal disease: Secondary | ICD-10-CM

## 2018-10-13 DIAGNOSIS — I10 Essential (primary) hypertension: Secondary | ICD-10-CM | POA: Diagnosis not present

## 2018-10-13 DIAGNOSIS — Z0001 Encounter for general adult medical examination with abnormal findings: Secondary | ICD-10-CM | POA: Diagnosis not present

## 2018-10-13 DIAGNOSIS — E1165 Type 2 diabetes mellitus with hyperglycemia: Secondary | ICD-10-CM

## 2018-10-13 LAB — POCT GLYCOSYLATED HEMOGLOBIN (HGB A1C): Hemoglobin A1C: 5.3 % (ref 4.0–5.6)

## 2018-10-13 NOTE — Progress Notes (Signed)
[facility] [place], [place] [address]  Internal MEDICINE  Office Visit Note  Patient Name: Michele Bartlett  [id number]  [id number]  Date of Service: 10/22/2018   Pt is here for routine health maintenance examination  Chief Complaint  Patient presents with  . Medicare Wellness     The patient presents for annual wellness visit today. She has no concerns or complaints. States that blood sugars are doinig well. Asthma and allergies are well controlled. The patient just started on hemodialysis this week. Renal functions have started to decline and nephrology felt this was the appropriate time to start dialysis. She is feeling fatigued.     Current Medication: Outpatient Encounter Medications as of 10/13/2018  Medication Sig  . albuterol (PROAIR HFA) 108 (90 Base) MCG/ACT inhaler Inhale 2 puffs into the lungs every 6 (six) hours as needed. For  Wheezing.  Marland Kitchen albuterol (PROVENTIL) (2.5 MG/3ML) 0.083% nebulizer solution Take 2.5 mg by nebulization every 6 (six) hours as needed for wheezing or shortness of breath.  . benazepril (LOTENSIN) 40 MG tablet Take 40 mg by mouth daily.  . Cholecalciferol (VITAMIN D-3) 25 MCG (1000 UT) CAPS Take by mouth.  . fluticasone (FLONASE) 50 MCG/ACT nasal spray Place 1 spray into both nostrils 2 (two) times daily as needed for allergies or rhinitis. Reported on 11/30/2015  . furosemide (LASIX) 40 MG tablet TAKE 1 TABLET EVERY OTHER DAY (Patient taking differently: Take 40 mg by mouth every other day. )  . levocetirizine (XYZAL) 5 MG tablet Take 1 tablet (5 mg total) by mouth daily.  . metoprolol tartrate (LOPRESSOR) 25 MG tablet TAKE 1 TABLET TWICE A DAY (Patient taking differently: Take 25 mg by mouth 2 (two) times daily. )  . NOVOFINE 30G X 8 MM MISC   . NOVOFINE 32G X 6 MM MISC USE AS DIRECTED  . omeprazole (PRILOSEC) 20 MG capsule Take 20 mg by mouth daily as needed (heartburn).   . OXYGEN Inhale 2 L/min into the lungs at bedtime.   . VELTASSA 8.4 g packet Take 8.4 g by mouth every evening.   Marland Kitchen VICTOZA 18 MG/3ML SOPN Inject 0.3 mLs (1.8 mg total) into the skin every morning.  Marland Kitchen Spacer/Aero-Holding Chambers (AEROCHAMBER PLUS WITH MASK) inhaler 1 each by Other route once. Use as instructed   No facility-administered encounter medications on file as of 10/13/2018.     Surgical History: Past Surgical History:  Procedure Laterality Date  . BREAST BIOPSY Left 03/22/2018   affirm stero biopsy x 2 areas/path pending  . CATARACT EXTRACTION W/PHACO Left 06/15/2018   Procedure: CATARACT EXTRACTION PHACO AND INTRAOCULAR LENS PLACEMENT (IOC);  Surgeon: Leandrew Koyanagi, MD;  Location: [facility] ORS;  Service: Ophthalmology;  Laterality: Left;  [place] 01:35 AP% 9.9 CDE 12.6 Fluid pack lot # [id number]  . DIALYSIS/PERMA CATHETER INSERTION N/A 10/11/2018   Procedure: DIALYSIS/PERMA CATHETER INSERTION;  Surgeon: Algernon Huxley, MD;  Location: [facility];  Service: Cardiovascular;  Laterality: N/A;    Medical History: Past Medical History:  Diagnosis Date  . Asthma   . Diabetes mellitus without complication ([place])   . Down's syndrome   . Dysrhythmia   . Edema    FEET/LEG  . Environmental allergies   . Hypertension   . Kidney disease    STAGE 4  . Nephrotic syndrome   . Sleep apnea    mother reports she does not use CPAP because it causes panic attacks  . Wheezing  Family History: Family History  Problem Relation Age of Onset  . Diabetes Mellitus II Mother   . CAD Father   . Hypertension Father       Review of Systems  Constitutional: Negative for activity change, chills, diaphoresis, fatigue and unexpected weight change.  HENT: Negative for ear pain, postnasal drip, sinus pressure and voice change.   Respiratory: Negative for cough, shortness of breath and wheezing.   Cardiovascular: Positive for leg swelling. Negative for chest pain and palpitations.  Gastrointestinal: Negative for abdominal pain,  constipation, diarrhea, nausea and vomiting.  Endocrine: Negative for cold intolerance, heat intolerance, polydipsia, polyphagia and polyuria.       Blood sugars doing well   Genitourinary: Negative for dysuria and flank pain.  Musculoskeletal: Negative for arthralgias, back pain, gait problem and neck pain.  Skin: Negative for color change and rash.  Allergic/Immunologic: Positive for environmental allergies. Negative for food allergies.  Neurological: Negative for dizziness and headaches.  Hematological: Negative for adenopathy. Does not bruise/bleed easily.  Psychiatric/Behavioral: Negative for agitation, behavioral problems (depression) and hallucinations.     Today's Vitals   10/13/18 1520  BP: 98/68  Pulse: (!) 106  Resp: 16  SpO2: 90%  Weight: 261 lb 9.6 oz (118.7 kg)  Height: 5' 2.5" (1.588 m)    Physical Exam Vitals signs and nursing note reviewed.  Constitutional:      General: She is not in acute distress.    Appearance: She is well-developed. She is obese. She is not diaphoretic.  HENT:     Head: Normocephalic and atraumatic.     Nose: Nose normal.     Mouth/Throat:     Pharynx: No oropharyngeal exudate.  Eyes:     Conjunctiva/sclera: Conjunctivae normal.     Pupils: Pupils are equal, round, and reactive to light.  Neck:     Musculoskeletal: Normal range of motion and neck supple.     Thyroid: No thyromegaly.     Vascular: No JVD.     Trachea: No tracheal deviation.  Cardiovascular:     Rate and Rhythm: Normal rate and regular rhythm.     Pulses: Normal pulses.     Heart sounds: Normal heart sounds. No murmur. No friction rub. No gallop.      Comments: Bilateral, non-pitting edema in bilateral lower extremities.  Pulmonary:     Effort: Pulmonary effort is normal. No respiratory distress.     Breath sounds: Normal breath sounds. No wheezing or rales.  Chest:     Chest wall: No tenderness.  Abdominal:     General: Bowel sounds are normal.     Palpations:  Abdomen is soft.     Tenderness: There is no abdominal tenderness.  Musculoskeletal: Normal range of motion.  Lymphadenopathy:     Cervical: No cervical adenopathy.  Skin:    General: Skin is warm and dry.     Capillary Refill: Capillary refill takes less than 2 seconds.  Neurological:     Mental Status: She is alert and oriented to person, place, and time. Mental status is at baseline.     Cranial Nerves: No cranial nerve deficit.     Comments: Patient is at her neurological baseline.  Psychiatric:        Behavior: Behavior normal.        Thought Content: Thought content normal.        Judgment: Judgment normal.    Depression screen [facility] 2/9 04/25/2018 11/08/2017 10/25/2017 09/09/2017  Decreased Interest 0 0 0  0  Down, Depressed, Hopeless 0 0 0 0  PHQ - 2 Score 0 0 0 0    Functional Status Survey:    MMSE - Mini Mental State Exam 10/13/2018  Orientation to time 5  Orientation to Place 5  Registration 3  Attention/ Calculation 5  Recall 3  Language- name 2 objects 2  Language- repeat 1  Language- follow 3 step command 3  Language- read & follow direction 1  Write a sentence 1  Copy design 1  Total score 30    Fall Risk  04/25/2018 11/08/2017 10/25/2017 09/09/2017  Falls in the past year? No No No No      LABS: Recent Results (from the past 2160 hour(s))  Glucose, capillary     Status: None   Collection Time: 10/11/18 12:50 PM  Result Value Ref Range   Glucose-Capillary 77 70 - 99 mg/dL  Glucose, capillary     Status: Abnormal   Collection Time: 10/11/18  2:22 PM  Result Value Ref Range   Glucose-Capillary 63 (L) 70 - 99 mg/dL  Potassium [facility] vascular lab only)     Status: None   Collection Time: 10/11/18  2:35 PM  Result Value Ref Range   Potassium [facility] vascular lab) 4.0 3.5 - 5.1    Comment: Performed at [facility], [address] [place], [place] [address]  Glucose, capillary     Status: Abnormal   Collection Time: 10/11/18  2:51 PM  Result  Value Ref Range   Glucose-Capillary 195 (H) 70 - 99 mg/dL  Glucose, capillary     Status: Abnormal   Collection Time: 10/11/18  4:34 PM  Result Value Ref Range   Glucose-Capillary 108 (H) 70 - 99 mg/dL  POCT HgB A1C     Status: Normal   Collection Time: 10/13/18  3:44 PM  Result Value Ref Range   Hemoglobin A1C 5.3 4.0 - 5.6 %   HbA1c POC (<> result, manual entry)     HbA1c, POC (prediabetic range)     HbA1c, POC (controlled diabetic range)     Assessment/Plan: 1. Encounter for general adult medical examination with abnormal findings Annual health maintenance exam today  2. Uncontrolled type 2 diabetes mellitus with hyperglycemia (HCC) - POCT HgB A1C 5.3.  Continue victoza daily.  3  3. End stage renal disease ([place]) Hemodialysis as scheduled.   4. Essential (primary) hypertension Stable. Continue bp medication as prescribed   5. Mild intermittent asthma without complication Continue to use inhalers as prescribed   General Counseling: Lenord Carbo understanding of the findings of todays visit and agrees with plan of treatment. I have discussed any further diagnostic evaluation that may be needed or ordered today. We also reviewed her medications today. she has been encouraged to call the office with any questions or concerns that should arise related to todays visit.    Counseling:  Diabetes Counseling:  1. Addition of ACE inh/ ARB'S for nephroprotection. Microalbumin is updated  2. Diabetic foot care, prevention of complications. Podiatry consult 3. Exercise and lose weight.  4. Diabetic eye examination, Diabetic eye exam is updated  5. Monitor blood sugar closlely. nutrition counseling.  6. Sign and symptoms of hypoglycemia including shaking sweating,confusion and headaches.  This patient was seen by Leretha Pol FNP Collaboration with Dr Lavera Guise as a part of collaborative care agreement  Orders Placed This Encounter  Procedures  . POCT HgB A1C      Time spent: 30 Minutes  Lavera Guise, MD  Internal Medicine

## 2018-10-17 ENCOUNTER — Ambulatory Visit (INDEPENDENT_AMBULATORY_CARE_PROVIDER_SITE_OTHER): Payer: Medicare Other | Admitting: Internal Medicine

## 2018-10-17 ENCOUNTER — Encounter: Payer: Self-pay | Admitting: Internal Medicine

## 2018-10-17 VITALS — BP 140/82 | HR 100 | Resp 16 | Ht 61.0 in | Wt 261.0 lb

## 2018-10-17 DIAGNOSIS — N186 End stage renal disease: Secondary | ICD-10-CM | POA: Diagnosis not present

## 2018-10-17 DIAGNOSIS — Z992 Dependence on renal dialysis: Secondary | ICD-10-CM

## 2018-10-17 DIAGNOSIS — Z9981 Dependence on supplemental oxygen: Secondary | ICD-10-CM

## 2018-10-17 DIAGNOSIS — G4733 Obstructive sleep apnea (adult) (pediatric): Secondary | ICD-10-CM | POA: Diagnosis not present

## 2018-10-17 NOTE — Progress Notes (Signed)
[facility] [place], [place] [address]  Pulmonary Sleep Medicine   Office Visit Note  Patient Name: Michele Bartlett DOB: [birth year] MRN [id number]  Date of Service: 10/17/2018  Complaints/HPI: OSA not on CPAP new HD renal failure she is now on hemodialysis for no failure.  She continues to not use CPAP she states that she is not able to tolerate and is not going to use.  Patient has been doing fine otherwise.  No recent admissions to the hospital.  As far as the hemodialysis is concerned she sees nephrology regularly and they are keeping a close eye on her response to therapy  ROS  General: (-) fever, (-) chills, (-) night sweats, (-) weakness Skin: (-) rashes, (-) itching,. Eyes: (-) visual changes, (-) redness, (-) itching. Nose and Sinuses: (-) nasal stuffiness or itchiness, (-) postnasal drip, (-) nosebleeds, (-) sinus trouble. Mouth and Throat: (-) sore throat, (-) hoarseness. Neck: (-) swollen glands, (-) enlarged thyroid, (-) neck pain. Respiratory: - cough, (-) bloody sputum, - shortness of breath, - wheezing. Cardiovascular: - ankle swelling, (-) chest pain. Lymphatic: (-) lymph node enlargement. Neurologic: (-) numbness, (-) tingling. Psychiatric: (-) anxiety, (-) depression   Current Medication: Outpatient Encounter Medications as of 10/17/2018  Medication Sig  . albuterol (PROAIR HFA) 108 (90 Base) MCG/ACT inhaler Inhale 2 puffs into the lungs every 6 (six) hours as needed. For  Wheezing.  Marland Kitchen albuterol (PROVENTIL) (2.5 MG/3ML) 0.083% nebulizer solution Take 2.5 mg by nebulization every 6 (six) hours as needed for wheezing or shortness of breath.  . benazepril (LOTENSIN) 40 MG tablet Take 40 mg by mouth daily.  . Cholecalciferol (VITAMIN D-3) 25 MCG (1000 UT) CAPS Take by mouth.  . fluticasone (FLONASE) 50 MCG/ACT nasal spray Place 1 spray into both nostrils 2 (two) times daily as needed for allergies or rhinitis. Reported on 11/30/2015  .  furosemide (LASIX) 40 MG tablet TAKE 1 TABLET EVERY OTHER DAY (Patient taking differently: Take 40 mg by mouth every other day. )  . levocetirizine (XYZAL) 5 MG tablet Take 1 tablet (5 mg total) by mouth daily.  . metoprolol tartrate (LOPRESSOR) 25 MG tablet TAKE 1 TABLET TWICE A DAY (Patient taking differently: Take 25 mg by mouth 2 (two) times daily. )  . NOVOFINE 30G X 8 MM MISC   . NOVOFINE 32G X 6 MM MISC USE AS DIRECTED  . omeprazole (PRILOSEC) 20 MG capsule Take 20 mg by mouth daily as needed (heartburn).   . OXYGEN Inhale 2 L/min into the lungs at bedtime.  Marland Kitchen Spacer/Aero-Holding Chambers (AEROCHAMBER PLUS WITH MASK) inhaler 1 each by Other route once. Use as instructed  . VELTASSA 8.4 g packet Take 8.4 g by mouth every evening.   Marland Kitchen VICTOZA 18 MG/3ML SOPN Inject 0.3 mLs (1.8 mg total) into the skin every morning.   No facility-administered encounter medications on file as of 10/17/2018.     Surgical History: Past Surgical History:  Procedure Laterality Date  . BREAST BIOPSY Left 03/22/2018   affirm stero biopsy x 2 areas/path pending  . CATARACT EXTRACTION W/PHACO Left 06/15/2018   Procedure: CATARACT EXTRACTION PHACO AND INTRAOCULAR LENS PLACEMENT (IOC);  Surgeon: Leandrew Koyanagi, MD;  Location: [facility] ORS;  Service: Ophthalmology;  Laterality: Left;  [place] 01:35 AP% 9.9 CDE 12.6 Fluid pack lot # [id number]  . DIALYSIS/PERMA CATHETER INSERTION N/A 10/11/2018   Procedure: DIALYSIS/PERMA CATHETER INSERTION;  Surgeon: Algernon Huxley, MD;  Location: [place] [facility] LAB;  Service: Cardiovascular;  Laterality: N/A;    Medical History: Past Medical History:  Diagnosis Date  . Asthma   . Diabetes mellitus without complication ([place])   . Down's syndrome   . Dysrhythmia   . Edema    FEET/LEG  . Environmental allergies   . Hypertension   . Kidney disease    STAGE 4  . Nephrotic syndrome   . Sleep apnea    mother reports she does not use CPAP because it causes panic attacks  .  Wheezing     Family History: Family History  Problem Relation Age of Onset  . Diabetes Mellitus II Mother   . CAD Father   . Hypertension Father     Social History: Social History   Socioeconomic History  . Marital status: Single    Spouse name: Not on file  . Number of children: Not on file  . Years of education: Not on file  . Highest education level: Not on file  Occupational History  . Not on file  Social Needs  . Financial resource strain: Not on file  . Food insecurity:    Worry: Not on file    Inability: Not on file  . Transportation needs:    Medical: Not on file    Non-medical: Not on file  Tobacco Use  . Smoking status: Never Smoker  . Smokeless tobacco: Never Used  Substance and Sexual Activity  . Alcohol use: No  . Drug use: No  . Sexual activity: Not on file  Lifestyle  . Physical activity:    Days per week: Not on file    Minutes per session: Not on file  . Stress: Not on file  Relationships  . Social connections:    Talks on phone: Not on file    Gets together: Not on file    Attends religious service: Not on file    Active member of club or organization: Not on file    Attends meetings of clubs or organizations: Not on file    Relationship status: Not on file  . Intimate partner violence:    Fear of current or ex partner: Not on file    Emotionally abused: Not on file    Physically abused: Not on file    Forced sexual activity: Not on file  Other Topics Concern  . Not on file  Social History Narrative  . Not on file    Vital Signs: Blood pressure 140/82, pulse 100, resp. rate 16, height 5\' 1"  (1.549 m), weight 261 lb (118.4 kg), SpO2 91 %.  Examination: General Appearance: The patient is well-developed, well-nourished, and in no distress. Skin: Gross inspection of skin unremarkable. Head: normocephalic, no gross deformities. Eyes: no gross deformities noted. ENT: ears appear grossly normal no exudates. Neck: Supple. No thyromegaly.  No LAD. Respiratory: no rhonchi noted. Cardiovascular: Normal S1 and S2 without murmur or rub. Extremities: No cyanosis. pulses are equal. Neurologic: Alert and oriented. No involuntary movements.  LABS: Recent Results (from the past 2160 hour(s))  Glucose, capillary     Status: None   Collection Time: 10/11/18 12:50 PM  Result Value Ref Range   Glucose-Capillary 77 70 - 99 mg/dL  Glucose, capillary     Status: Abnormal   Collection Time: 10/11/18  2:22 PM  Result Value Ref Range   Glucose-Capillary 63 (L) 70 - 99 mg/dL  Potassium [facility] vascular lab only)     Status: None   Collection Time: 10/11/18  2:35 PM  Result Value Ref  Range   Potassium [facility] vascular lab) 4.0 3.5 - 5.1    Comment: Performed at [facility], [address] [place],  [address]  Glucose, capillary     Status: Abnormal   Collection Time: 10/11/18  2:51 PM  Result Value Ref Range   Glucose-Capillary 195 (H) 70 - 99 mg/dL  Glucose, capillary     Status: Abnormal   Collection Time: 10/11/18  4:34 PM  Result Value Ref Range   Glucose-Capillary 108 (H) 70 - 99 mg/dL  POCT HgB A1C     Status: Normal   Collection Time: 10/13/18  3:44 PM  Result Value Ref Range   Hemoglobin A1C 5.3 4.0 - 5.6 %   HbA1c POC (<> result, manual entry)     HbA1c, POC (prediabetic range)     HbA1c, POC (controlled diabetic range)      Radiology: No results found.  No results found.  Vas [place] Upper Extremity Arterial Duplex  Result Date: 10/05/2018 UPPER EXTREMITY DUPLEX STUDY Indications: ESRD. History:     Preop Vein map.  Performing Technologist: Concha Norway RVT  Examination Guidelines: A complete evaluation includes B-mode imaging, spectral Doppler, color Doppler, and power Doppler as needed of all accessible portions of each vessel. Bilateral testing is considered an integral part of a complete examination. Limited examinations for reoccurring indications may be performed as noted.  Right Pre-Dialysis Findings:  +-----------------------+----------+--------------------+---------+--------+ Location               PSV (cm/s)Intralum. Diam. (cm)Waveform Comments +-----------------------+----------+--------------------+---------+--------+ Brachial Antecub. fossa74        0.29                triphasic         +-----------------------+----------+--------------------+---------+--------+ Radial Art at Wrist    62        0.11                triphasic         +-----------------------+----------+--------------------+---------+--------+ Ulnar Art at Wrist     48        0.19                triphasic         +-----------------------+----------+--------------------+---------+--------+ Left Pre-Dialysis Findings: +---------------------+----------+-------------------+----------+--------------+ Location             PSV (cm/s)Intralum. Diam.    Waveform  Comments                                      (cm)                                        +---------------------+----------+-------------------+----------+--------------+ Brachial Antecub.    64        0.24               triphasic High           fossa                                                       bifurcation    +---------------------+----------+-------------------+----------+--------------+ Radial Art at Wrist  24        0.13  biphasic                 +---------------------+----------+-------------------+----------+--------------+ Ulnar Art at Wrist   25        0.15               monophasic               +---------------------+----------+-------------------+----------+--------------+  Summary:  Right: No obstruction visualized in the right upper extremity. Left: No obstruction visualized in the left upper extremity. *See table(s) above for measurements and observations. Electronically signed by Hortencia Pilar MD on 10/05/2018 at 5:24:55 PM.    Final    Vas [place] Upper Extremity Vein Mapping  Result Date:  10/05/2018 UPPER EXTREMITY VEIN MAPPING  Indications: Pre-access. History: ESRD.  Performing Technologist: Concha Norway RVT  Examination Guidelines: A complete evaluation includes B-mode imaging, spectral Doppler, color Doppler, and power Doppler as needed of all accessible portions of each vessel. Bilateral testing is considered an integral part of a complete examination. Limited examinations for reoccurring indications may be performed as noted. +-----------------+-------------+----------+--------+ Right Cephalic   Diameter (cm)Depth (cm)Findings +-----------------+-------------+----------+--------+ Shoulder             0.38                        +-----------------+-------------+----------+--------+ Prox upper arm       0.35                        +-----------------+-------------+----------+--------+ Mid upper arm        0.37                        +-----------------+-------------+----------+--------+ Dist upper arm       0.34                        +-----------------+-------------+----------+--------+ Antecubital fossa    0.40                        +-----------------+-------------+----------+--------+ Prox forearm         0.37                        +-----------------+-------------+----------+--------+ Mid forearm          0.35                        +-----------------+-------------+----------+--------+ Dist forearm         0.23                        +-----------------+-------------+----------+--------+ +-----------------+-------------+----------+---------+ Right Basilic    Diameter (cm)Depth (cm)Findings  +-----------------+-------------+----------+---------+ Prox upper arm       0.51                         +-----------------+-------------+----------+---------+ Mid upper arm        0.39               branching +-----------------+-------------+----------+---------+ Dist upper arm       0.47               branching  +-----------------+-------------+----------+---------+ Antecubital fossa    0.40                         +-----------------+-------------+----------+---------+ Prox forearm  0.36                         +-----------------+-------------+----------+---------+ +-----------------+-------------+----------+---------+ Left Cephalic    Diameter (cm)Depth (cm)Findings  +-----------------+-------------+----------+---------+ Shoulder             0.45                         +-----------------+-------------+----------+---------+ Prox upper arm       0.42                         +-----------------+-------------+----------+---------+ Mid upper arm        0.47                         +-----------------+-------------+----------+---------+ Dist upper arm       0.46               branching +-----------------+-------------+----------+---------+ Antecubital fossa    0.42                         +-----------------+-------------+----------+---------+ Prox forearm         0.30               branching +-----------------+-------------+----------+---------+ Mid forearm          0.36                         +-----------------+-------------+----------+---------+ Dist forearm         0.33                         +-----------------+-------------+----------+---------+ +-----------------+-------------+----------+---------+ Left Basilic     Diameter (cm)Depth (cm)Findings  +-----------------+-------------+----------+---------+ Prox upper arm       0.46                         +-----------------+-------------+----------+---------+ Mid upper arm        0.45                         +-----------------+-------------+----------+---------+ Dist upper arm       0.39               branching +-----------------+-------------+----------+---------+ Antecubital fossa    0.39                         +-----------------+-------------+----------+---------+ Prox forearm          0.37                         +-----------------+-------------+----------+---------+ Summary: Right: NL. Left: NL. *See table(s) above for measurements and observations.  Diagnosing physician: Hortencia Pilar MD Electronically signed by Hortencia Pilar MD on 10/05/2018 at 5:24:52 PM.    Final       Assessment and Plan: Patient Active Problem List   Diagnosis Date Noted  . Pedal edema 10/09/2018  . Preop cardiovascular exam 10/09/2018  . Tachycardia 10/09/2018  . Chronic kidney disease (CKD), stage IV (severe) ([place]) 10/04/2018  . Flu vaccine need 06/12/2018  . Type 2 diabetes mellitus with diabetic chronic kidney disease ([place]) 11/04/2017  . Essential (primary) hypertension 11/04/2017  . Moderate persistent asthma, uncomplicated 94/76/5465  . Morbid (severe) obesity due to excess calories ([place])  11/04/2017  . Obstructive sleep apnea, adult 11/04/2017  . Acute respiratory failure ([place]) 11/30/2015  . CAP (community acquired pneumonia) 11/30/2015  . Syncope and collapse 11/30/2015  . Diabetes mellitus without complication ([place]) 77/82/4235    1. OSA again encourage compliance with CPAP.  She is going to try a different mask which hopefully might help I spoke to her mother in the room and she will contact the DME provider to try to get her a facemask that may actually improved compliance 2. ESRD on hemodialysis now continue to follow with her primary nephrologist 3. Oxygen dependent encourage compliance with oxygen 4. Morbid obesity discussed weight loss also through exercise if possible  General Counseling: I have discussed the findings of the evaluation and examination with Michele Bartlett.  I have also discussed any further diagnostic evaluation thatmay be needed or ordered today. Michele Bartlett verbalizes understanding of the findings of todays visit. We also reviewed her medications today and discussed drug interactions and side effects including but not limited excessive drowsiness and altered mental  states. We also discussed that there is always a risk not just to her but also people around her. she has been encouraged to call the office with any questions or concerns that should arise related to todays visit.    Time spent: 15 minutes  I have personally obtained a history, examined the patient, evaluated laboratory and imaging results, formulated the assessment and plan and placed orders.    Allyne Gee, MD [facility] Sleep [facility]

## 2018-10-17 NOTE — Patient Instructions (Signed)
CPAP and BPAP Information  CPAP and BPAP are methods of helping a person breathe with the use of air pressure. CPAP stands for "continuous positive airway pressure." BPAP stands for "bi-level positive airway pressure." In both methods, air is blown through your nose or mouth and into your air passages to help you breathe well.  CPAP and BPAP use different amounts of pressure to blow air. With CPAP, the amount of pressure stays the same while you breathe in and out. With BPAP, the amount of pressure is increased when you breathe in (inhale) so that you can take larger breaths. Your health care provider will recommend whether CPAP or BPAP would be more helpful for you.  Why are CPAP and BPAP treatments used?  CPAP or BPAP can be helpful if you have:  · Sleep apnea.  · Chronic obstructive pulmonary disease (COPD).  · Heart failure.  · Medical conditions that weaken the muscles of the chest including muscular dystrophy, or neurological diseases such as amyotrophic lateral sclerosis (ALS).  · Other problems that cause breathing to be weak, abnormal, or difficult.  CPAP is most commonly used for obstructive sleep apnea (OSA) to keep the airways from collapsing when the muscles relax during sleep.  How is CPAP or BPAP administered?  Both CPAP and BPAP are provided by a small machine with a flexible plastic tube that attaches to a plastic mask. You wear the mask. Air is blown through the mask into your nose or mouth. The amount of pressure that is used to blow the air can be adjusted on the machine. Your health care provider will determine the pressure setting that should be used based on your individual needs.  When should CPAP or BPAP be used?  In most cases, the mask only needs to be worn during sleep. Generally, the mask needs to be worn throughout the night and during any daytime naps. People with certain medical conditions may also need to wear the mask at other times when they are awake. Follow instructions from your  health care provider about when to use the machine.  What are some tips for using the mask?    · Because the mask needs to be snug, some people feel trapped or closed-in (claustrophobic) when first using the mask. If you feel this way, you may need to get used to the mask. One way to do this is by holding the mask loosely over your nose or mouth and then gradually applying the mask more snugly. You can also gradually increase the amount of time that you use the mask.  · Masks are available in various types and sizes. Some fit over your mouth and nose while others fit over just your nose. If your mask does not fit well, talk with your health care provider about getting a different one.  · If you are using a mask that fits over your nose and you tend to breathe through your mouth, a chin strap may be applied to help keep your mouth closed.  · The CPAP and BPAP machines have alarms that may sound if the mask comes off or develops a leak.  · If you have trouble with the mask, it is very important that you talk with your health care provider about finding a way to make the mask easier to tolerate. Do not stop using the mask. Stopping the use of the mask could have a negative impact on your health.  What are some tips for using the machine?  ·   Place your CPAP or BPAP machine on a secure table or stand near an electrical outlet.  · Know where the on/off switch is located on the machine.  · Follow instructions from your health care provider about how to set the pressure on your machine and when you should use it.  · Do not eat or drink while the CPAP or BPAP machine is on. Food or fluids could get pushed into your lungs by the pressure of the CPAP or BPAP.  · Do not smoke. Tobacco smoke residue can damage the machine.  · For home use, CPAP and BPAP machines can be rented or purchased through home health care companies. Many different brands of machines are available. Renting a machine before purchasing may help you find out  which particular machine works well for you.  · Keep the CPAP or BPAP machine and attachments clean. Ask your health care provider for specific instructions.  Get help right away if:  · You have redness or open areas around your nose or mouth where the mask fits.  · You have trouble using the CPAP or BPAP machine.  · You cannot tolerate wearing the CPAP or BPAP mask.  · You have pain, discomfort, and bloating in your abdomen.  Summary  · CPAP and BPAP are methods of helping a person breathe with the use of air pressure.  · Both CPAP and BPAP are provided by a small machine with a flexible plastic tube that attaches to a plastic mask.  · If you have trouble with the mask, it is very important that you talk with your health care provider about finding a way to make the mask easier to tolerate.  This information is not intended to replace advice given to you by your health care provider. Make sure you discuss any questions you have with your health care provider.  Document Released: 06/11/2004 Document Revised: 05/16/2018 Document Reviewed: 08/02/2016  Elsevier Interactive Patient Education © 2019 [employer]

## 2018-10-22 DIAGNOSIS — J4520 Mild intermittent asthma, uncomplicated: Secondary | ICD-10-CM | POA: Insufficient documentation

## 2018-10-22 DIAGNOSIS — N186 End stage renal disease: Secondary | ICD-10-CM | POA: Insufficient documentation

## 2018-10-22 DIAGNOSIS — E1165 Type 2 diabetes mellitus with hyperglycemia: Secondary | ICD-10-CM | POA: Insufficient documentation

## 2018-10-24 ENCOUNTER — Ambulatory Visit: Payer: Self-pay | Admitting: Internal Medicine

## 2018-10-27 ENCOUNTER — Encounter (INDEPENDENT_AMBULATORY_CARE_PROVIDER_SITE_OTHER): Payer: Self-pay

## 2018-11-08 ENCOUNTER — Other Ambulatory Visit (INDEPENDENT_AMBULATORY_CARE_PROVIDER_SITE_OTHER): Payer: Self-pay | Admitting: Nurse Practitioner

## 2018-11-09 ENCOUNTER — Other Ambulatory Visit: Payer: Self-pay

## 2018-11-09 ENCOUNTER — Inpatient Hospital Stay: Admission: RE | Admit: 2018-11-09 | Payer: Medicare Other | Source: Ambulatory Visit

## 2018-11-09 MED ORDER — FUROSEMIDE 40 MG PO TABS: 40.0000 mg | ORAL_TABLET | ORAL | 3 refills | Status: AC

## 2018-11-10 ENCOUNTER — Encounter
Admission: RE | Admit: 2018-11-10 | Discharge: 2018-11-10 | Disposition: A | Discharge status: Home / Self Care | Payer: Medicare Other | Source: Ambulatory Visit | Attending: Vascular Surgery | Admitting: Vascular Surgery

## 2018-11-10 ENCOUNTER — Other Ambulatory Visit (INDEPENDENT_AMBULATORY_CARE_PROVIDER_SITE_OTHER): Payer: Self-pay | Admitting: Nurse Practitioner

## 2018-11-10 ENCOUNTER — Encounter: Payer: Self-pay | Admitting: *Deleted

## 2018-11-10 ENCOUNTER — Other Ambulatory Visit: Payer: Self-pay

## 2018-11-10 DIAGNOSIS — Z01812 Encounter for preprocedural laboratory examination: Secondary | ICD-10-CM | POA: Insufficient documentation

## 2018-11-10 HISTORY — DX: Pulmonary hypertension, unspecified: I27.20

## 2018-11-10 HISTORY — DX: Pneumonia, unspecified organism: J18.9

## 2018-11-10 LAB — CBC WITH DIFFERENTIAL/PLATELET
Abs Immature Granulocytes: 0.04 10*3/uL (ref 0.00–0.07)
Basophils Absolute: 0.1 10*3/uL (ref 0.0–0.1)
Basophils Relative: 2 %
Eosinophils Absolute: 0.1 10*3/uL (ref 0.0–0.5)
Eosinophils Relative: 2 %
HCT: 47.2 % — ABNORMAL HIGH (ref 36.0–46.0)
Hemoglobin: 14.7 g/dL (ref 12.0–15.0)
IMMATURE GRANULOCYTES: 1 %
LYMPHS PCT: 18 %
Lymphs Abs: 1.2 10*3/uL (ref 0.7–4.0)
MCH: 28.9 pg (ref 26.0–34.0)
MCHC: 31.1 g/dL (ref 30.0–36.0)
MCV: 92.9 fL (ref 80.0–100.0)
Monocytes Absolute: 0.9 10*3/uL (ref 0.1–1.0)
Monocytes Relative: 13 %
Neutro Abs: 4.4 10*3/uL (ref 1.7–7.7)
Neutrophils Relative %: 64 %
Platelets: 242 10*3/uL (ref 150–400)
RBC: 5.08 MIL/uL (ref 3.87–5.11)
RDW: 13.7 % (ref 11.5–15.5)
WBC: 6.8 10*3/uL (ref 4.0–10.5)
nRBC: 0 % (ref 0.0–0.2)

## 2018-11-10 LAB — BASIC METABOLIC PANEL
Anion gap: 8 (ref 5–15)
BUN: 28 mg/dL — ABNORMAL HIGH (ref 6–20)
CO2: 30 mmol/L (ref 22–32)
Calcium: 10.4 mg/dL — ABNORMAL HIGH (ref 8.9–10.3)
Chloride: 100 mmol/L (ref 98–111)
Creatinine, Ser: 3.91 mg/dL — ABNORMAL HIGH (ref 0.44–1.00)
GFR calc Af Amer: 15 mL/min — ABNORMAL LOW (ref >60)
GFR calc non Af Amer: 13 mL/min — ABNORMAL LOW (ref >60)
GLUCOSE: 112 mg/dL — AB (ref 70–99)
Potassium: 3.6 mmol/L (ref 3.5–5.1)
Sodium: 138 mmol/L (ref 135–145)

## 2018-11-10 LAB — TYPE AND SCREEN
ABO/RH(D): B POS
Antibody Screen: NEGATIVE

## 2018-11-10 LAB — PROTIME-INR
INR: 0.89
Prothrombin Time: 12 seconds (ref 11.4–15.2)

## 2018-11-10 LAB — APTT: aPTT: 28 seconds (ref 24–36)

## 2018-11-10 NOTE — Patient Instructions (Addendum)
Your procedure is scheduled on: Wednesday, February 19 Report to Day Surgery on the 2nd floor of the [facility]. To find out your arrival time, please call [phone] between 1PM - 3PM on: Tuesday, February 18  REMEMBER: Instructions that are not followed completely may result in serious medical risk, up to and including death; or upon the discretion of your surgeon and anesthesiologist your surgery may need to be rescheduled.  Do not eat food after midnight the night before surgery.  No gum chewing, lozengers or hard candies.  You may however, drink water up to 2 hours before you are scheduled to arrive for your surgery. Do not drink anything within 2 hours of the start of your surgery.  No Alcohol for 24 hours before or after surgery.  No Smoking including e-cigarettes for 24 hours prior to surgery.  No chewable tobacco products for at least 6 hours prior to surgery.  No nicotine patches on the day of surgery.  On the morning of surgery brush your teeth with toothpaste and water, you may rinse your mouth with mouthwash if you wish. Do not swallow any toothpaste or mouthwash.  Notify your doctor if there is any change in your medical condition (cold, fever, infection).  Do not wear jewelry, make-up, hairpins, clips or nail polish.  Do not wear lotions, powders, or perfumes.   Do not shave 48 hours prior to surgery.   Contacts and dentures may not be worn into surgery.  Do not bring valuables to the hospital, including drivers license, insurance or credit cards.  [facility] is not responsible for any belongings or valuables.   TAKE THESE MEDICATIONS THE MORNING OF SURGERY:  1.  Albuterol nebulizer 2.  Metoprolol 3.  Dulera inhaler 4.  Omeprazole - (take one the night before and one on the morning of surgery - helps to prevent nausea after surgery.)  Use CHG wipes as directed on instruction sheet.  Use inhalers on the day of surgery and bring to the hospital.  NO  INSULIN on the morning of surgery.  NOW!  Stop Anti-inflammatories (NSAIDS) such as Advil, Aleve, Ibuprofen, Motrin, Naproxen, Naprosyn and Aspirin based products such as Excedrin, Goodys Powder, BC Powder. (May take Tylenol or Acetaminophen if needed.)  NOW!  Stop ANY OVER THE COUNTER supplements until after surgery. (May continue Vitamin D.)  Wear comfortable clothing (specific to your surgery type) to the hospital.  If you are being discharged the day of surgery, you will not be allowed to drive home. You will need a responsible adult to drive you home and stay with you that night.   Please call [phone] if you have any questions about these instructions.

## 2018-11-14 ENCOUNTER — Other Ambulatory Visit: Payer: Medicare Other

## 2018-11-14 MED ORDER — CEFAZOLIN SODIUM-DEXTROSE 1-4 GM/50ML-% IV SOLN
1.0000 g | INTRAVENOUS | Status: AC
  Administered 2018-11-15: 1 g via INTRAVENOUS

## 2018-11-15 ENCOUNTER — Ambulatory Visit: Payer: Medicare Other | Admitting: Anesthesiology

## 2018-11-15 ENCOUNTER — Other Ambulatory Visit: Payer: Self-pay

## 2018-11-15 ENCOUNTER — Ambulatory Visit
Admission: RE | Admit: 2018-11-15 | Discharge: 2018-11-15 | Disposition: A | Discharge status: Home / Self Care | Payer: Medicare Other | Source: Ambulatory Visit | Attending: Vascular Surgery | Admitting: Vascular Surgery

## 2018-11-15 ENCOUNTER — Encounter: Payer: Self-pay | Admitting: Vascular Surgery

## 2018-11-15 ENCOUNTER — Encounter
Admission: RE | Disposition: A | Discharge status: Home / Self Care | Payer: Self-pay | Source: Ambulatory Visit | Attending: Vascular Surgery

## 2018-11-15 DIAGNOSIS — J9602 Acute respiratory failure with hypercapnia: Secondary | ICD-10-CM | POA: Diagnosis not present

## 2018-11-15 DIAGNOSIS — Z888 Allergy status to other drugs, medicaments and biological substances status: Secondary | ICD-10-CM

## 2018-11-15 DIAGNOSIS — I272 Pulmonary hypertension, unspecified: Secondary | ICD-10-CM | POA: Insufficient documentation

## 2018-11-15 DIAGNOSIS — Z7984 Long term (current) use of oral hypoglycemic drugs: Secondary | ICD-10-CM

## 2018-11-15 DIAGNOSIS — J45909 Unspecified asthma, uncomplicated: Secondary | ICD-10-CM | POA: Insufficient documentation

## 2018-11-15 DIAGNOSIS — G4730 Sleep apnea, unspecified: Secondary | ICD-10-CM | POA: Insufficient documentation

## 2018-11-15 DIAGNOSIS — I120 Hypertensive chronic kidney disease with stage 5 chronic kidney disease or end stage renal disease: Secondary | ICD-10-CM

## 2018-11-15 DIAGNOSIS — Z79899 Other long term (current) drug therapy: Secondary | ICD-10-CM

## 2018-11-15 DIAGNOSIS — R05 Cough: Secondary | ICD-10-CM | POA: Diagnosis not present

## 2018-11-15 DIAGNOSIS — Z992 Dependence on renal dialysis: Secondary | ICD-10-CM | POA: Insufficient documentation

## 2018-11-15 DIAGNOSIS — N186 End stage renal disease: Secondary | ICD-10-CM | POA: Insufficient documentation

## 2018-11-15 DIAGNOSIS — Z6841 Body Mass Index (BMI) 40.0 and over, adult: Secondary | ICD-10-CM | POA: Insufficient documentation

## 2018-11-15 DIAGNOSIS — Q909 Down syndrome, unspecified: Secondary | ICD-10-CM

## 2018-11-15 DIAGNOSIS — E1122 Type 2 diabetes mellitus with diabetic chronic kidney disease: Secondary | ICD-10-CM | POA: Insufficient documentation

## 2018-11-15 HISTORY — PX: AV FISTULA PLACEMENT: SHX1204

## 2018-11-15 LAB — GLUCOSE, CAPILLARY: Glucose-Capillary: 132 mg/dL — ABNORMAL HIGH (ref 70–99)

## 2018-11-15 LAB — POCT I-STAT 4, (NA,K, GLUC, HGB,HCT)
Glucose, Bld: 98 mg/dL (ref 70–99)
HEMATOCRIT: 41 % (ref 36.0–46.0)
HEMOGLOBIN: 13.9 g/dL (ref 12.0–15.0)
Potassium: 4 mmol/L (ref 3.5–5.1)
Sodium: 139 mmol/L (ref 135–145)

## 2018-11-15 LAB — ABO/RH: ABO/RH(D): B POS

## 2018-11-15 LAB — HCG, QUANTITATIVE, PREGNANCY: hCG, Beta Chain, Quant, S: 3 m[IU]/mL (ref <5)

## 2018-11-15 SURGERY — ARTERIOVENOUS (AV) FISTULA CREATION
Anesthesia: General | Laterality: Left

## 2018-11-15 MED ORDER — PAPAVERINE HCL 30 MG/ML IJ SOLN
INTRAMUSCULAR | Status: AC
  Filled 2018-11-15: qty 2

## 2018-11-15 MED ORDER — MIDAZOLAM HCL 2 MG/2ML IJ SOLN
INTRAMUSCULAR | Status: AC
  Filled 2018-11-15: qty 2

## 2018-11-15 MED ORDER — PHENYLEPHRINE HCL 10 MG/ML IJ SOLN
INTRAMUSCULAR | Status: DC | PRN
  Administered 2018-11-15: 100 ug via INTRAVENOUS
  Administered 2018-11-15: 200 ug via INTRAVENOUS
  Administered 2018-11-15: 100 ug via INTRAVENOUS
  Administered 2018-11-15: 200 ug via INTRAVENOUS
  Administered 2018-11-15 (×2): 100 ug via INTRAVENOUS

## 2018-11-15 MED ORDER — FENTANYL CITRATE (PF) 100 MCG/2ML IJ SOLN
INTRAMUSCULAR | Status: AC
  Filled 2018-11-15: qty 2

## 2018-11-15 MED ORDER — FENTANYL CITRATE (PF) 100 MCG/2ML IJ SOLN
INTRAMUSCULAR | Status: DC | PRN
  Administered 2018-11-15 (×2): 50 ug via INTRAVENOUS

## 2018-11-15 MED ORDER — SODIUM CHLORIDE 0.9 % IV SOLN
INTRAVENOUS | Status: DC | PRN
  Administered 2018-11-15: 30 ug/min via INTRAVENOUS

## 2018-11-15 MED ORDER — CEFAZOLIN SODIUM-DEXTROSE 1-4 GM/50ML-% IV SOLN
INTRAVENOUS | Status: AC
  Filled 2018-11-15: qty 50

## 2018-11-15 MED ORDER — PROPOFOL 500 MG/50ML IV EMUL
INTRAVENOUS | Status: AC
  Filled 2018-11-15: qty 50

## 2018-11-15 MED ORDER — LIDOCAINE HCL (PF) 2 % IJ SOLN
INTRAMUSCULAR | Status: AC
  Filled 2018-11-15: qty 10

## 2018-11-15 MED ORDER — SUCCINYLCHOLINE CHLORIDE 20 MG/ML IJ SOLN
INTRAMUSCULAR | Status: DC | PRN
  Administered 2018-11-15: 120 mg via INTRAVENOUS

## 2018-11-15 MED ORDER — SUCCINYLCHOLINE CHLORIDE 20 MG/ML IJ SOLN
INTRAMUSCULAR | Status: AC
  Filled 2018-11-15: qty 1

## 2018-11-15 MED ORDER — ONDANSETRON HCL 4 MG/2ML IJ SOLN
INTRAMUSCULAR | Status: AC
  Filled 2018-11-15: qty 2

## 2018-11-15 MED ORDER — SODIUM CHLORIDE 0.9 % IV SOLN
INTRAVENOUS | Status: DC
  Administered 2018-11-15: 08:00:00 via INTRAVENOUS

## 2018-11-15 MED ORDER — ALBUTEROL SULFATE HFA 108 (90 BASE) MCG/ACT IN AERS
INHALATION_SPRAY | RESPIRATORY_TRACT | Status: DC | PRN
  Administered 2018-11-15: 6 via RESPIRATORY_TRACT

## 2018-11-15 MED ORDER — BUPIVACAINE HCL (PF) 0.5 % IJ SOLN
INTRAMUSCULAR | Status: AC
  Filled 2018-11-15: qty 30

## 2018-11-15 MED ORDER — HYDROCODONE-ACETAMINOPHEN 5-325 MG PO TABS: 1.0000 | ORAL_TABLET | Freq: Four times a day (QID) | ORAL | 0 refills | Status: AC | PRN

## 2018-11-15 MED ORDER — OXYCODONE HCL 5 MG/5ML PO SOLN: 5.0000 mg | Freq: Once | ORAL | Status: DC | PRN

## 2018-11-15 MED ORDER — FENTANYL CITRATE (PF) 100 MCG/2ML IJ SOLN
25.0000 ug | INTRAMUSCULAR | Status: DC | PRN
  Administered 2018-11-15: 50 ug via INTRAVENOUS
  Administered 2018-11-15: 25 ug via INTRAVENOUS

## 2018-11-15 MED ORDER — PROMETHAZINE HCL 25 MG/ML IJ SOLN: 6.2500 mg | INTRAMUSCULAR | Status: DC | PRN

## 2018-11-15 MED ORDER — GLYCOPYRROLATE 0.2 MG/ML IJ SOLN
INTRAMUSCULAR | Status: DC | PRN
  Administered 2018-11-15: 0.2 mg via INTRAVENOUS

## 2018-11-15 MED ORDER — SODIUM CHLORIDE 0.9 % IV SOLN
INTRAVENOUS | Status: DC | PRN
  Administered 2018-11-15: 20 mL via INTRAMUSCULAR

## 2018-11-15 MED ORDER — OXYCODONE HCL 5 MG PO TABS: 5.0000 mg | ORAL_TABLET | Freq: Once | ORAL | Status: DC | PRN

## 2018-11-15 MED ORDER — PHENYLEPHRINE HCL 10 MG/ML IJ SOLN
INTRAMUSCULAR | Status: AC
  Filled 2018-11-15: qty 1

## 2018-11-15 MED ORDER — PROPOFOL 10 MG/ML IV BOLUS
INTRAVENOUS | Status: DC | PRN
  Administered 2018-11-15: 20 mg via INTRAVENOUS
  Administered 2018-11-15: 150 mg via INTRAVENOUS

## 2018-11-15 MED ORDER — CHLORHEXIDINE GLUCONATE CLOTH 2 % EX PADS: 6.0000 | MEDICATED_PAD | Freq: Once | CUTANEOUS | Status: DC

## 2018-11-15 MED ORDER — FAMOTIDINE 20 MG PO TABS
20.0000 mg | ORAL_TABLET | Freq: Once | ORAL | Status: AC
  Administered 2018-11-15: 20 mg via ORAL

## 2018-11-15 MED ORDER — BUPIVACAINE LIPOSOME 1.3 % IJ SUSP
INTRAMUSCULAR | Status: AC
  Filled 2018-11-15: qty 20

## 2018-11-15 MED ORDER — FAMOTIDINE 20 MG PO TABS
ORAL_TABLET | ORAL | Status: AC
  Administered 2018-11-15: 20 mg via ORAL
  Filled 2018-11-15: qty 1

## 2018-11-15 MED ORDER — ROPIVACAINE HCL 5 MG/ML IJ SOLN
INTRAMUSCULAR | Status: AC
  Filled 2018-11-15: qty 30

## 2018-11-15 MED ORDER — HEPARIN SODIUM (PORCINE) 5000 UNIT/ML IJ SOLN
INTRAMUSCULAR | Status: AC
  Filled 2018-11-15: qty 1

## 2018-11-15 MED ORDER — ONDANSETRON HCL 4 MG/2ML IJ SOLN
INTRAMUSCULAR | Status: DC | PRN
  Administered 2018-11-15: 4 mg via INTRAVENOUS

## 2018-11-15 MED ORDER — MEPERIDINE HCL 50 MG/ML IJ SOLN: 6.2500 mg | INTRAMUSCULAR | Status: DC | PRN

## 2018-11-15 MED ORDER — FENTANYL CITRATE (PF) 100 MCG/2ML IJ SOLN
INTRAMUSCULAR | Status: AC
  Administered 2018-11-15: 50 ug via INTRAVENOUS
  Filled 2018-11-15: qty 2

## 2018-11-15 SURGICAL SUPPLY — 57 items
APPLIER CLIP 11 MED OPEN (CLIP)
APPLIER CLIP 9.375 SM OPEN (CLIP)
BAG DECANTER FOR FLEXI CONT (MISCELLANEOUS) ×3 IMPLANT
BLADE SURG SZ11 CARB STEEL (BLADE) ×3 IMPLANT
BOOT SUTURE AID YELLOW STND (SUTURE) ×3 IMPLANT
BRUSH SCRUB EZ  4% CHG (MISCELLANEOUS) ×2
BRUSH SCRUB EZ 4% CHG (MISCELLANEOUS) ×1 IMPLANT
CANISTER SUCT 1200ML W/VALVE (MISCELLANEOUS) ×3 IMPLANT
CHLORAPREP W/TINT 26ML (MISCELLANEOUS) ×3 IMPLANT
CLIP APPLIE 11 MED OPEN (CLIP) IMPLANT
CLIP APPLIE 9.375 SM OPEN (CLIP) IMPLANT
COVER WAND RF STERILE (DRAPES) IMPLANT
DERMABOND ADVANCED (GAUZE/BANDAGES/DRESSINGS) ×4
DERMABOND ADVANCED .7 DNX12 (GAUZE/BANDAGES/DRESSINGS) ×2 IMPLANT
DRESSING SURGICEL FIBRLLR 1X2 (HEMOSTASIS) ×1 IMPLANT
DRSG SURGICEL FIBRILLAR 1X2 (HEMOSTASIS) ×3
ELECT CAUTERY BLADE 6.4 (BLADE) ×3 IMPLANT
ELECT REM PT RETURN 9FT ADLT (ELECTROSURGICAL) ×3
ELECTRODE REM PT RTRN 9FT ADLT (ELECTROSURGICAL) ×1 IMPLANT
GEL ULTRASOUND 20GR AQUASONIC (MISCELLANEOUS) IMPLANT
GLOVE BIO SURGEON STRL SZ7 (GLOVE) ×9 IMPLANT
GLOVE INDICATOR 7.5 STRL GRN (GLOVE) ×6 IMPLANT
GLOVE SURG SYN 8.0 (GLOVE) ×3 IMPLANT
GOWN STRL REUS W/ TWL LRG LVL3 (GOWN DISPOSABLE) ×2 IMPLANT
GOWN STRL REUS W/ TWL XL LVL3 (GOWN DISPOSABLE) ×1 IMPLANT
GOWN STRL REUS W/TWL LRG LVL3 (GOWN DISPOSABLE) ×4
GOWN STRL REUS W/TWL XL LVL3 (GOWN DISPOSABLE) ×2
IV NS 500ML (IV SOLUTION) ×2
IV NS 500ML BAXH (IV SOLUTION) ×1 IMPLANT
KIT TURNOVER KIT A (KITS) ×3 IMPLANT
LABEL OR SOLS (LABEL) ×3 IMPLANT
LOOP RED MAXI  1X406MM (MISCELLANEOUS) ×2
LOOP VESSEL MAXI 1X406 RED (MISCELLANEOUS) ×1 IMPLANT
LOOP VESSEL MINI 0.8X406 BLUE (MISCELLANEOUS) ×2 IMPLANT
LOOPS BLUE MINI 0.8X406MM (MISCELLANEOUS) ×4
NEEDLE FILTER BLUNT 18X 1/2SAF (NEEDLE) ×2
NEEDLE FILTER BLUNT 18X1 1/2 (NEEDLE) ×1 IMPLANT
NEEDLE HYPO 30X.5 LL (NEEDLE) IMPLANT
NS IRRIG 500ML POUR BTL (IV SOLUTION) ×3 IMPLANT
PACK EXTREMITY ARMC (MISCELLANEOUS) ×3 IMPLANT
PAD PREP 24X41 OB/GYN DISP (PERSONAL CARE ITEMS) ×3 IMPLANT
PUNCH SURGICAL ROTATE 2.7MM (MISCELLANEOUS) IMPLANT
STOCKINETTE STRL 4IN 9604848 (GAUZE/BANDAGES/DRESSINGS) ×3 IMPLANT
SUT MNCRL+ 5-0 UNDYED PC-3 (SUTURE) ×2 IMPLANT
SUT MONOCRYL 5-0 (SUTURE) ×4
SUT PROLENE 6 0 BV (SUTURE) ×9 IMPLANT
SUT SILK 2 0 (SUTURE) ×2
SUT SILK 2-0 18XBRD TIE 12 (SUTURE) ×1 IMPLANT
SUT SILK 3 0 (SUTURE) ×2
SUT SILK 3-0 18XBRD TIE 12 (SUTURE) ×1 IMPLANT
SUT SILK 4 0 (SUTURE) ×2
SUT SILK 4-0 18XBRD TIE 12 (SUTURE) ×1 IMPLANT
SUT VIC AB 3-0 SH 27 (SUTURE) ×4
SUT VIC AB 3-0 SH 27X BRD (SUTURE) ×2 IMPLANT
SYR 20CC LL (SYRINGE) ×3 IMPLANT
SYR 3ML LL SCALE MARK (SYRINGE) ×3 IMPLANT
TOWEL OR 17X26 4PK STRL BLUE (TOWEL DISPOSABLE) IMPLANT

## 2018-11-15 NOTE — Anesthesia Post-op Follow-up Note (Signed)
Anesthesia QCDR form completed.        

## 2018-11-15 NOTE — Discharge Instructions (Signed)

## 2018-11-15 NOTE — Anesthesia Procedure Notes (Signed)
Procedure Name: Intubation Date/Time: 11/15/2018 8:04 AM Performed by: Chanetta Marshall, CRNA Pre-anesthesia Checklist: Patient identified, Emergency Drugs available, Suction available and Patient being monitored Patient Re-evaluated:Patient Re-evaluated prior to induction Oxygen Delivery Method: Circle system utilized Preoxygenation: Pre-oxygenation with 100% oxygen Induction Type: IV induction Ventilation: Mask ventilation without difficulty Laryngoscope Size: McGraph Grade View: Grade I Tube size: 7.0 mm Number of attempts: 1 Airway Equipment and Method: Stylet and Video-laryngoscopy Placement Confirmation: ETT inserted through vocal cords under direct vision,  positive ETCO2,  CO2 detector and breath sounds checked- equal and bilateral Secured at: 21 cm Tube secured with: Tape Dental Injury: Teeth and Oropharynx as per pre-operative assessment

## 2018-11-15 NOTE — Anesthesia Postprocedure Evaluation (Signed)
Anesthesia Post Note  Patient: Cecia Egge  Procedure(s) Performed: ARTERIOVENOUS (AV) FISTULA CREATION (Brachiocephalic) (Left )  Patient location during evaluation: PACU Anesthesia Type: General Level of consciousness: awake and alert and oriented Pain management: pain level controlled Vital Signs Assessment: post-procedure vital signs reviewed and stable Respiratory status: spontaneous breathing, nonlabored ventilation and respiratory function stable Cardiovascular status: blood pressure returned to baseline and stable Postop Assessment: no signs of nausea or vomiting Anesthetic complications: no     Last Vitals:  Vitals:   11/15/18 1042 11/15/18 1053  BP: 122/74 136/81  Pulse: (!) 109 (!) 108  Resp: (!) 4 14  Temp:  (!) 36.2 C  SpO2: 93% 95%    Last Pain:  Vitals:   11/15/18 1053  TempSrc: Temporal  PainSc: Asleep                 Lennie Dunnigan

## 2018-11-15 NOTE — H&P (Signed)
@LOGO @   MRN : [id number]  Michele Bartlett is a 48 y.o. (1971/01/01) female who presents with chief complaint of need dialysis access.  History of Present Illness:    The patient is seen for evaluation of dialysis access.  The patient has a history of multiple failed accesses.  This is her first access.    Current access is via a catheter which is functioning well.  There have not been any episodes of catheter infection.  The patient denies amaurosis fugax or recent TIA symptoms. There are no recent neurological changes noted. The patient denies claudication symptoms or rest pain symptoms. The patient denies history of DVT, PE or superficial thrombophlebitis. The patient denies recent episodes of angina or shortness of breath.   Vein mapping from January 2020 shows adequate vein for wrist or upper arm fistula bilaterally.  Current Meds  Medication Sig  . albuterol (PROAIR HFA) 108 (90 Base) MCG/ACT inhaler Inhale 2 puffs into the lungs every 6 (six) hours as needed. For  Wheezing.  Marland Kitchen albuterol (PROVENTIL) (2.5 MG/3ML) 0.083% nebulizer solution Take 2.5 mg by nebulization every 6 (six) hours as needed for wheezing or shortness of breath.  . benazepril (LOTENSIN) 40 MG tablet Take 40 mg by mouth daily.  . Cholecalciferol (VITAMIN D-3) 25 MCG (1000 UT) CAPS Take 1,000 Units by mouth daily.   . fluticasone (FLONASE) 50 MCG/ACT nasal spray Place 1 spray into both nostrils 2 (two) times daily as needed for allergies or rhinitis. Reported on 11/30/2015  . furosemide (LASIX) 40 MG tablet Take 1 tablet (40 mg total) by mouth every other day.  . levocetirizine (XYZAL) 5 MG tablet Take 1 tablet (5 mg total) by mouth daily.  . metoprolol tartrate (LOPRESSOR) 25 MG tablet TAKE 1 TABLET TWICE A DAY (Patient taking differently: Take 25 mg by mouth 2 (two) times daily. )  . mometasone-formoterol (DULERA) 100-5 MCG/ACT AERO Inhale 2 puffs into the lungs 2 (two) times daily as needed for wheezing or shortness  of breath.   Marland Kitchen NOVOFINE 32G X 6 MM MISC USE AS DIRECTED  . omeprazole (PRILOSEC) 20 MG capsule Take 20 mg by mouth daily as needed (heartburn).   . OXYGEN Inhale 2 L/min into the lungs at bedtime.  Marland Kitchen Spacer/Aero-Holding Chambers (AEROCHAMBER PLUS WITH MASK) inhaler 1 each by Other route once. Use as instructed  . VELTASSA 8.4 g packet Take 8.4 g by mouth every evening.   Marland Kitchen VICTOZA 18 MG/3ML SOPN Inject 0.3 mLs (1.8 mg total) into the skin every morning. (Patient taking differently: Inject 1.2 mg into the skin every morning. )  . [DISCONTINUED] furosemide (LASIX) 40 MG tablet TAKE 1 TABLET EVERY OTHER DAY (Patient taking differently: Take 40 mg by mouth every other day. )    Past Medical History:  Diagnosis Date  . Asthma   . Diabetes mellitus without complication ([place])   . Down's syndrome   . Dysrhythmia   . Edema    FEET/LEG  . Environmental allergies   . Hypertension   . Kidney disease    STAGE 4  . Nephrotic syndrome   . Pneumonia   . Pulmonary hypertension ([place])   . Sleep apnea    mother reports she does not use CPAP because it causes panic attacks  . Wheezing     Past Surgical History:  Procedure Laterality Date  . BREAST BIOPSY Left 03/22/2018   affirm stero biopsy x 2 areas/path pending  . CATARACT EXTRACTION W/PHACO Left 06/15/2018   Procedure:  CATARACT EXTRACTION PHACO AND INTRAOCULAR LENS PLACEMENT (IOC);  Surgeon: Leandrew Koyanagi, MD;  Location: [facility];  Service: Ophthalmology;  Laterality: Left;  [place] 01:35 AP% 9.9 CDE 12.6 Fluid pack lot # [id number]  . DIALYSIS/PERMA CATHETER INSERTION N/A 10/11/2018   Procedure: DIALYSIS/PERMA CATHETER INSERTION;  Surgeon: Algernon Huxley, MD;  Location: [facility];  Service: Cardiovascular;  Laterality: N/A;  . EYE SURGERY    . INGUINAL HERNIA REPAIR  as a baby  . TONSILLECTOMY      Social History Social History   Tobacco Use  . Smoking status: Never Smoker  . Smokeless tobacco: Never Used  Substance Use  Topics  . Alcohol use: No  . Drug use: No    Family History Family History  Problem Relation Age of Onset  . Diabetes Mellitus II Mother   . CAD Father   . Hypertension Father     Allergies  Allergen Reactions  . Novocain [Procaine] Rash    Skin peel raw all over     REVIEW OF SYSTEMS (Negative unless checked)  Constitutional: [] Weight loss  [] Fever  [] Chills Cardiac: [] Chest pain   [] Chest pressure   [] Palpitations   [] Shortness of breath when laying flat   [] Shortness of breath with exertion. Vascular:  [] Pain in legs with walking   [] Pain in legs at rest  [] History of DVT   [] Phlebitis   [] Swelling in legs   [] Varicose veins   [] Non-healing ulcers Pulmonary:   [] Uses home oxygen   [] Productive cough   [] Hemoptysis   [] Wheeze  [] COPD   [] Asthma Neurologic:  [] Dizziness   [] Seizures   [] History of stroke   [] History of TIA  [] Aphasia   [] Vissual changes   [] Weakness or numbness in arm   [] Weakness or numbness in leg Musculoskeletal:   [] Joint swelling   [] Joint pain   [] Low back pain Hematologic:  [] Easy bruising  [] Easy bleeding   [] Hypercoagulable state   [] Anemic Gastrointestinal:  [] Diarrhea   [] Vomiting  [] Gastroesophageal reflux/heartburn   [] Difficulty swallowing. Genitourinary:  [x] Chronic kidney disease   [] Difficult urination  [] Frequent urination   [] Blood in urine Skin:  [] Rashes   [] Ulcers  Psychological:  [] History of anxiety   []  History of major depression.  Physical Examination  Vitals:   11/15/18 0612  BP: (!) 154/96  Pulse: 94  Resp: 17  Temp: 99.2 F (37.3 C)  TempSrc: Tympanic  SpO2: 92%  Weight: 110.2 kg  Height: 5\' 1"  (1.549 m)   Body mass index is 45.91 kg/m. Gen: WD/WN, NAD Head: [place]/AT, No temporalis wasting.  Ear/Nose/Throat: Hearing grossly intact, nares w/o erythema or drainage Eyes: PER, EOMI, sclera nonicteric.  Neck: Supple, no large masses.   Pulmonary:  Good air movement, no audible wheezing bilaterally, no use of accessory  muscles.  Cardiac: RRR, no JVD Vascular:  Vessel Right Left  Radial Palpable Palpable  Brachial Palpable Palpable  Carotid Palpable Palpable  Gastrointestinal: Non-distended. No guarding/no peritoneal signs.  Musculoskeletal: M/S 5/5 throughout.  No deformity or atrophy.  Neurologic: CN 2-12 intact. Symmetrical.  Speech is fluent. Motor exam as listed above. Psychiatric: Judgment intact, Mood & affect appropriate for pt's clinical situation. Dermatologic: No rashes or ulcers noted.  No changes consistent with cellulitis. Lymph : No lichenification or skin changes of chronic lymphedema.  CBC Lab Results  Component Value Date   WBC 6.8 11/10/2018   HGB 13.9 11/15/2018   HCT 41.0 11/15/2018   MCV 92.9 11/10/2018   PLT 242 11/10/2018  BMET    Component Value Date/Time   NA 139 11/15/2018 0646   NA 142 10/14/2017 1336   NA 139 03/10/2012 2022   K 4.0 11/15/2018 0646   K 4.3 03/10/2012 2022   CL 100 11/10/2018 0825   CL 103 03/10/2012 2022   CO2 30 11/10/2018 0825   CO2 28 03/10/2012 2022   GLUCOSE 98 11/15/2018 0646   GLUCOSE 99 03/10/2012 2022   BUN 28 (H) 11/10/2018 0825   BUN 16 10/14/2017 1336   BUN 25 (H) 03/10/2012 2022   CREATININE 3.91 (H) 11/10/2018 0825   CREATININE 1.84 (H) 03/10/2012 2022   CALCIUM 10.4 (H) 11/10/2018 0825   CALCIUM 9.0 03/10/2012 2022   GFRNONAA 13 (L) 11/10/2018 0825   GFRNONAA 34 (L) 03/10/2012 2022   GFRAA 15 (L) 11/10/2018 0825   GFRAA 39 (L) 03/10/2012 2022   Estimated Creatinine Clearance: 20.4 mL/min (A) (by C-G formula based on SCr of 3.91 mg/dL (H)).  COAG Lab Results  Component Value Date   INR 0.89 11/10/2018    Radiology No results found.   Assessment/Plan 1.  End-stage renal disease requiring hemodialysis:  Recommend:  At this time the patient does not have appropriate extremity access for dialysis  Patient should have a left wrist fistula created.  The risks, benefits and alternative therapies were reviewed  in detail with the patient.  All questions were answered.  The patient agrees to proceed with surgery.   2.   COPD:  Continue pulmonary medications and aerosols as already ordered, these medications have been reviewed and there are no changes at this time.  3.  Hypertension:  Patient will continue medical management; nephrology is following no changes in oral medications.  4. Diabetes mellitus:  Glucose will be monitored and oral medications been held this morning once the patient has undergone the patient's procedure po intake will be reinitiated and again Accu-Cheks will be used to assess the blood glucose level and treat as needed. The patient will be restarted on the patient's usual hypoglycemic regime       Hortencia Pilar, MD  11/15/2018 7:32 AM

## 2018-11-15 NOTE — Op Note (Signed)
OPERATIVE NOTE   PROCEDURE: left brachial cephalic arteriovenous fistula placement  PRE-OPERATIVE DIAGNOSIS: End Stage Renal Disease  POST-OPERATIVE DIAGNOSIS: End Stage Renal Disease  SURGEON: Hortencia Pilar  ASSISTANT(S): None  ANESTHESIA: general  ESTIMATED BLOOD LOSS: <50 cc  FINDING(S): Small less than 2 mm vein noted at the wrist.  Vein at the antecubital fossa was 4 mm.  The artery at the antecubital fossa measured 2 mm  SPECIMEN(S):  none  INDICATIONS:   Michele Bartlett is a 48 y.o. female who presents with end stage renal disease.  The patient is scheduled for left brachiocephalic arteriovenous fistula placement.  The patient is aware the risks include but are not limited to: bleeding, infection, steal syndrome, nerve damage, ischemic monomelic neuropathy, failure to mature, and need for additional procedures.  The patient is aware of the risks of the procedure and elects to proceed forward.  DESCRIPTION: After full informed written consent was obtained from the patient, the patient was brought back to the operating room and placed supine upon the operating table.  Prior to induction, the patient received IV antibiotics.   After obtaining adequate anesthesia, the patient was then prepped and draped in the standard fashion for a left arm access procedure.   A linear incision was created at the wrist based on the preoperative vein mapping showing a 3.3 mm cephalic vein.  The cephalic vein that was identified was clearly less than 2 mm and not suitable.  Therefore the wrist incision was irrigated and then closed in layers with 3-0 Vicryl followed by 4-0 Monocryl subcuticular.  A curvilinear incision was then created midway between the radial impulse and the cephalic vein. The cephalic vein was then identified and dissected circumferentially. It was marked with a surgical marker.    Attention was then turned to the brachial artery which was exposed through the same  incision and looped proximally and distally. Side branches were controlled with 4-0 silk ties.  The distal segment of the vein was ligated with a  2-0 silk, and the vein was transected.  The proximal segment was interrogated with serial dilators.  The vein accepted up to a 4 mm dilator without any difficulty. Heparinized saline was infused into the vein and clamped it with a small bulldog.  At this point, I reset my exposure of the brachial artery and controlled the artery with vessel loops proximally and distally.  An arteriotomy was then made with a #11 blade, and extended with a Potts scissor.  This artery was extremely small and therefore I elected to dilate the artery with [facility] coronary dilators.  The artery accepted a 1.5 and a 2.0 mm Garrett coronary dilator.  The 2.5 mm Donna Christen did not pass easily and therefore I did not advance this dilator.  Heparinized saline was injected proximal and distal into the radial artery.  The vein was then approximated to the artery while the artery was in its native bed and subsequently the vein was beveled using Potts scissors. The vein was then sewn to the artery in an end-to-side configuration with a running stitch of 6-0 Prolene.  Prior to completing this anastomosis Flushing maneuvers were performed and the artery was allowed to forward and back bleed.  There was no evidence of clot from any vessels.  I completed the anastomosis in the usual fashion and then released all vessel loops and clamps.    There was good  thrill in the venous outflow, and there was 1+ palpable radial pulse.  At this point, I irrigated out the surgical wound.  There was no further active bleeding.  The subcutaneous tissue was reapproximated with a running stitch of 3-0 Vicryl.  The skin was then reapproximated with a running subcuticular stitch of 4-0 Vicryl.  The skin was then cleaned, dried, and reinforced with Dermabond.    The patient tolerated this procedure well.   COMPLICATIONS:  None  CONDITION: [facility]  Office: [phone]   11/15/2018, 9:55 AM

## 2018-11-15 NOTE — Anesthesia Preprocedure Evaluation (Signed)
Anesthesia Evaluation  Patient identified by MRN, date of birth, ID band Patient awake    Reviewed: Allergy & Precautions, NPO status , Patient's Chart, lab work & pertinent test results  History of Anesthesia Complications Negative for: history of anesthetic complications  Airway Mallampati: IV  TM Distance: >3 FB Neck ROM: Full   Comment: Syndromic face Dental no notable dental hx.    Pulmonary asthma , sleep apnea and Oxygen sleep apnea ,    breath sounds clear to auscultation- rhonchi (-) wheezing      Cardiovascular hypertension, Pt. on medications (-) CAD, (-) Past MI, (-) Cardiac Stents and (-) CABG  Rhythm:Regular Rate:Normal - Systolic murmurs and - Diastolic murmurs    Neuro/Psych neg Seizures negative neurological ROS  negative psych ROS   GI/Hepatic negative GI ROS, Neg liver ROS,   Endo/Other  diabetesMorbid obesity  Renal/GU ESRF and DialysisRenal disease     Musculoskeletal negative musculoskeletal ROS (+)   Abdominal (+) + obese,   Peds  Hematology negative hematology ROS (+)   Anesthesia Other Findings Past Medical History: No date: Asthma No date: Diabetes mellitus without complication ([facility]) No date: Down's syndrome No date: Dysrhythmia No date: Edema     Comment:  FEET/LEG No date: Environmental allergies No date: Hypertension No date: Kidney disease     Comment:  STAGE 4 No date: Nephrotic syndrome No date: Pneumonia No date: Pulmonary hypertension (H[facility]) No date: Sleep apnea     Comment:  mother reports she does not use CPAP because it causes               panic attacks No date: Wheezing   Reproductive/Obstetrics                             Anesthesia Physical Anesthesia Plan  ASA: IV  Anesthesia Plan: General   Post-op Pain Management:    Induction: Intravenous  PONV Risk Score and Plan: 2 and Ondansetron and Dexamethasone  Airway Management  Planned: Oral ETT  Additional Equipment:   Intra-op Plan:   Post-operative Plan: Extubation in OR  Informed Consent: I have reviewed the patients History and Physical, chart, labs and discussed the procedure including the risks, benefits and alternatives for the proposed anesthesia with the patient or authorized representative who has indicated his/her understanding and acceptance.     Dental advisory given and Consent reviewed with POA  Plan Discussed with: CRNA and Anesthesiologist  Anesthesia Plan Comments:         Anesthesia Quick Evaluation

## 2018-11-15 NOTE — Transfer of Care (Signed)
Immediate Anesthesia Transfer of Care Note  Patient: Michele Bartlett  Procedure(s) Performed: ARTERIOVENOUS (AV) FISTULA CREATION (Brachiocephalic) (Left )  Patient Location: PACU  Anesthesia Type:General  Level of Consciousness: awake, alert  and oriented  Airway & Oxygen Therapy: Patient Spontanous Breathing and Patient connected to face mask oxygen  Post-op Assessment: Report given to RN and Post -op Vital signs reviewed and stable  Post vital signs: Reviewed and stable  Last Vitals:  Vitals Value Taken Time  BP    Temp    Pulse    Resp    SpO2      Last Pain:  Vitals:   11/15/18 0612  TempSrc: Tympanic  PainSc: 0-No pain         Complications: No apparent anesthesia complications

## 2018-11-17 ENCOUNTER — Emergency Department: Payer: Medicare Other

## 2018-11-17 ENCOUNTER — Encounter: Payer: Self-pay | Admitting: *Deleted

## 2018-11-17 ENCOUNTER — Inpatient Hospital Stay
Admission: EM | Admit: 2018-11-17 | Discharge: 2018-12-27 | DRG: 981 | Disposition: E | Payer: Medicare Other | Attending: Internal Medicine | Admitting: Internal Medicine

## 2018-11-17 ENCOUNTER — Other Ambulatory Visit: Payer: Self-pay

## 2018-11-17 DIAGNOSIS — I5082 Biventricular heart failure: Secondary | ICD-10-CM | POA: Diagnosis present

## 2018-11-17 DIAGNOSIS — J9602 Acute respiratory failure with hypercapnia: Principal | ICD-10-CM | POA: Diagnosis present

## 2018-11-17 DIAGNOSIS — Y832 Surgical operation with anastomosis, bypass or graft as the cause of abnormal reaction of the patient, or of later complication, without mention of misadventure at the time of the procedure: Secondary | ICD-10-CM | POA: Diagnosis not present

## 2018-11-17 DIAGNOSIS — N2581 Secondary hyperparathyroidism of renal origin: Secondary | ICD-10-CM | POA: Diagnosis present

## 2018-11-17 DIAGNOSIS — I132 Hypertensive heart and chronic kidney disease with heart failure and with stage 5 chronic kidney disease, or end stage renal disease: Secondary | ICD-10-CM | POA: Diagnosis present

## 2018-11-17 DIAGNOSIS — Z9119 Patient's noncompliance with other medical treatment and regimen: Secondary | ICD-10-CM

## 2018-11-17 DIAGNOSIS — Q909 Down syndrome, unspecified: Secondary | ICD-10-CM

## 2018-11-17 DIAGNOSIS — F40240 Claustrophobia: Secondary | ICD-10-CM | POA: Diagnosis present

## 2018-11-17 DIAGNOSIS — E872 Acidosis: Secondary | ICD-10-CM | POA: Diagnosis present

## 2018-11-17 DIAGNOSIS — J449 Chronic obstructive pulmonary disease, unspecified: Secondary | ICD-10-CM | POA: Diagnosis present

## 2018-11-17 DIAGNOSIS — Z9981 Dependence on supplemental oxygen: Secondary | ICD-10-CM

## 2018-11-17 DIAGNOSIS — E8729 Other acidosis: Secondary | ICD-10-CM

## 2018-11-17 DIAGNOSIS — R9431 Abnormal electrocardiogram [ECG] [EKG]: Secondary | ICD-10-CM

## 2018-11-17 DIAGNOSIS — I071 Rheumatic tricuspid insufficiency: Secondary | ICD-10-CM | POA: Diagnosis present

## 2018-11-17 DIAGNOSIS — R05 Cough: Secondary | ICD-10-CM

## 2018-11-17 DIAGNOSIS — Z5309 Procedure and treatment not carried out because of other contraindication: Secondary | ICD-10-CM | POA: Diagnosis not present

## 2018-11-17 DIAGNOSIS — I5022 Chronic systolic (congestive) heart failure: Secondary | ICD-10-CM | POA: Diagnosis present

## 2018-11-17 DIAGNOSIS — I951 Orthostatic hypotension: Secondary | ICD-10-CM | POA: Diagnosis not present

## 2018-11-17 DIAGNOSIS — G4733 Obstructive sleep apnea (adult) (pediatric): Secondary | ICD-10-CM | POA: Diagnosis present

## 2018-11-17 DIAGNOSIS — E1122 Type 2 diabetes mellitus with diabetic chronic kidney disease: Secondary | ICD-10-CM | POA: Diagnosis present

## 2018-11-17 DIAGNOSIS — Z8249 Family history of ischemic heart disease and other diseases of the circulatory system: Secondary | ICD-10-CM

## 2018-11-17 DIAGNOSIS — K5900 Constipation, unspecified: Secondary | ICD-10-CM | POA: Diagnosis present

## 2018-11-17 DIAGNOSIS — K219 Gastro-esophageal reflux disease without esophagitis: Secondary | ICD-10-CM | POA: Diagnosis present

## 2018-11-17 DIAGNOSIS — I2729 Other secondary pulmonary hypertension: Secondary | ICD-10-CM | POA: Diagnosis present

## 2018-11-17 DIAGNOSIS — R4182 Altered mental status, unspecified: Secondary | ICD-10-CM

## 2018-11-17 DIAGNOSIS — Z6841 Body Mass Index (BMI) 40.0 and over, adult: Secondary | ICD-10-CM

## 2018-11-17 DIAGNOSIS — I10 Essential (primary) hypertension: Secondary | ICD-10-CM | POA: Diagnosis present

## 2018-11-17 DIAGNOSIS — T8241XA Breakdown (mechanical) of vascular dialysis catheter, initial encounter: Secondary | ICD-10-CM | POA: Diagnosis not present

## 2018-11-17 DIAGNOSIS — Z833 Family history of diabetes mellitus: Secondary | ICD-10-CM

## 2018-11-17 DIAGNOSIS — D631 Anemia in chronic kidney disease: Secondary | ICD-10-CM | POA: Diagnosis present

## 2018-11-17 DIAGNOSIS — I472 Ventricular tachycardia: Secondary | ICD-10-CM | POA: Diagnosis not present

## 2018-11-17 DIAGNOSIS — N186 End stage renal disease: Secondary | ICD-10-CM | POA: Diagnosis present

## 2018-11-17 DIAGNOSIS — Z992 Dependence on renal dialysis: Secondary | ICD-10-CM

## 2018-11-17 DIAGNOSIS — I469 Cardiac arrest, cause unspecified: Secondary | ICD-10-CM | POA: Diagnosis not present

## 2018-11-17 DIAGNOSIS — J9601 Acute respiratory failure with hypoxia: Secondary | ICD-10-CM | POA: Diagnosis present

## 2018-11-17 DIAGNOSIS — T82898A Other specified complication of vascular prosthetic devices, implants and grafts, initial encounter: Secondary | ICD-10-CM | POA: Diagnosis not present

## 2018-11-17 DIAGNOSIS — R059 Cough, unspecified: Secondary | ICD-10-CM

## 2018-11-17 LAB — CBC WITH DIFFERENTIAL/PLATELET
Abs Immature Granulocytes: 0.08 10*3/uL — ABNORMAL HIGH (ref 0.00–0.07)
Basophils Absolute: 0.1 10*3/uL (ref 0.0–0.1)
Basophils Relative: 1 %
EOS PCT: 1 %
Eosinophils Absolute: 0 10*3/uL (ref 0.0–0.5)
HCT: 42.3 % (ref 36.0–46.0)
Hemoglobin: 13.3 g/dL (ref 12.0–15.0)
Immature Granulocytes: 1 %
LYMPHS PCT: 15 %
Lymphs Abs: 1.3 10*3/uL (ref 0.7–4.0)
MCH: 28.6 pg (ref 26.0–34.0)
MCHC: 31.4 g/dL (ref 30.0–36.0)
MCV: 91 fL (ref 80.0–100.0)
Monocytes Absolute: 0.7 10*3/uL (ref 0.1–1.0)
Monocytes Relative: 8 %
Neutro Abs: 6.5 10*3/uL (ref 1.7–7.7)
Neutrophils Relative %: 74 %
Platelets: 233 10*3/uL (ref 150–400)
RBC: 4.65 MIL/uL (ref 3.87–5.11)
RDW: 13.6 % (ref 11.5–15.5)
WBC: 8.7 10*3/uL (ref 4.0–10.5)
nRBC: 0.2 % (ref 0.0–0.2)

## 2018-11-17 LAB — BLOOD GAS, VENOUS
Acid-base deficit: 15.2 mmol/L — ABNORMAL HIGH (ref 0.0–2.0)
Bicarbonate: 18.5 mmol/L — ABNORMAL LOW (ref 20.0–28.0)
O2 Saturation: 67.4 %
PCO2 VEN: 86 mmHg — AB (ref 44.0–60.0)
PH VEN: 6.94 — AB (ref 7.250–7.430)
Patient temperature: 37
pO2, Ven: 58 mmHg — ABNORMAL HIGH (ref 32.0–45.0)

## 2018-11-17 LAB — COMPREHENSIVE METABOLIC PANEL
ALK PHOS: 54 U/L (ref 38–126)
ALT: 97 U/L — ABNORMAL HIGH (ref 0–44)
AST: 140 U/L — ABNORMAL HIGH (ref 15–41)
Albumin: 3.3 g/dL — ABNORMAL LOW (ref 3.5–5.0)
Anion gap: 14 (ref 5–15)
BUN: 20 mg/dL (ref 6–20)
CHLORIDE: 96 mmol/L — AB (ref 98–111)
CO2: 23 mmol/L (ref 22–32)
Calcium: 8.6 mg/dL — ABNORMAL LOW (ref 8.9–10.3)
Creatinine, Ser: 4.01 mg/dL — ABNORMAL HIGH (ref 0.44–1.00)
GFR calc Af Amer: 14 mL/min — ABNORMAL LOW (ref >60)
GFR calc non Af Amer: 13 mL/min — ABNORMAL LOW (ref >60)
Glucose, Bld: 189 mg/dL — ABNORMAL HIGH (ref 70–99)
Potassium: 3.9 mmol/L (ref 3.5–5.1)
Sodium: 133 mmol/L — ABNORMAL LOW (ref 135–145)
Total Bilirubin: 2.2 mg/dL — ABNORMAL HIGH (ref 0.3–1.2)
Total Protein: 7.2 g/dL (ref 6.5–8.1)

## 2018-11-17 LAB — INFLUENZA PANEL BY PCR (TYPE A & B)
Influenza A By PCR: NEGATIVE
Influenza B By PCR: NEGATIVE

## 2018-11-17 LAB — PROCALCITONIN: Procalcitonin: 0.42 ng/mL

## 2018-11-17 LAB — LACTIC ACID, PLASMA
Lactic Acid, Venous: 3.4 mmol/L (ref 0.5–1.9)
Lactic Acid, Venous: 2.7 mmol/L (ref 0.5–1.9)

## 2018-11-17 LAB — GLUCOSE, CAPILLARY: Glucose-Capillary: 165 mg/dL — ABNORMAL HIGH (ref 70–99)

## 2018-11-17 MED ORDER — SODIUM CHLORIDE 0.9 % IV BOLUS
500.0000 mL | Freq: Once | INTRAVENOUS | Status: AC
  Administered 2018-11-17: 500 mL via INTRAVENOUS

## 2018-11-17 NOTE — ED Provider Notes (Signed)
Patient with a pH of 6.9 CO2 of 86 she is retaining CO2.  Her chest x-ray looks like CHF.  CBC is okay.  Her lactic acid is up as well.  We will get BiPAP going try just a little bit of fluids for her lactic acid and go from there.  We will plan on getting her in the hospital.  She is altered.  Not quite sure why except for CO2 being retained.   Nena Polio, MD 11/16/2018 2218

## 2018-11-17 NOTE — ED Provider Notes (Addendum)
[facility] Emergency Department Provider Note  ____________________________________________  Time seen: Approximately 7:12 PM  I have reviewed the triage vital signs and the nursing notes.   HISTORY  Chief Complaint Weakness  Level 5 caveat:  Portions of the history and physical were unable to be obtained due to AMS   HPI Michele Bartlett is a 48 y.o. female with a history of asthma, Down syndrome, ESRD on HD from nephrotic syndrome, pulmonary hypertension, sleep apnea on nasal cannula oxygen, hypertension who presents for evaluation of altered mental status.  History is gathered mostly from the mother who is at the bedside.  She reports the patient had a fistula placed 2 days ago.  She received general anesthesia.  Since then patient has been very somnolent, has not been eating or drinking, has not been as interactive as she normally is.  Patient tells me that she has diffuse abdominal pain and a headache but is unable to provide any further history.  According to the mother she has had a mild cough.  She has had no vomiting or diarrhea, no fever or chills.  Patient denies chest pain or shortness of breath.   Past Medical History:  Diagnosis Date  . Asthma   . Diabetes mellitus without complication ([place])   . Down's syndrome   . Dysrhythmia   . Edema    FEET/LEG  . Environmental allergies   . Hypertension   . Kidney disease    STAGE 4  . Nephrotic syndrome   . Pneumonia   . Pulmonary hypertension ([place])   . Sleep apnea    mother reports she does not use CPAP because it causes panic attacks  . Wheezing     Patient Active Problem List   Diagnosis Date Noted  . Uncontrolled type 2 diabetes mellitus with hyperglycemia ([place]) 10/22/2018  . End stage renal disease ([place]) 10/22/2018  . Mild intermittent asthma without complication 27/25/3664  . Pedal edema 10/09/2018  . Encounter for general adult medical examination with abnormal findings 10/09/2018  .  Tachycardia 10/09/2018  . Chronic kidney disease (CKD), stage IV (severe) ([place]) 10/04/2018  . Flu vaccine need 06/12/2018  . Type 2 diabetes mellitus with diabetic chronic kidney disease ([place]) 11/04/2017  . Essential (primary) hypertension 11/04/2017  . Moderate persistent asthma, uncomplicated 40/34/7425  . Morbid (severe) obesity due to excess calories ([place]) 11/04/2017  . Obstructive sleep apnea, adult 11/04/2017  . Acute respiratory failure ([place]) 11/30/2015  . CAP (community acquired pneumonia) 11/30/2015  . Syncope and collapse 11/30/2015  . Diabetes mellitus without complication ([place]) 95/63/8756    Past Surgical History:  Procedure Laterality Date  . AV FISTULA PLACEMENT Left 11/15/2018   Procedure: ARTERIOVENOUS (AV) FISTULA CREATION (Brachiocephalic);  Surgeon: Katha Cabal, MD;  Location: [facility] ORS;  Service: Vascular;  Laterality: Left;  . BREAST BIOPSY Left 03/22/2018   affirm stero biopsy x 2 areas/path pending  . CATARACT EXTRACTION W/PHACO Left 06/15/2018   Procedure: CATARACT EXTRACTION PHACO AND INTRAOCULAR LENS PLACEMENT (IOC);  Surgeon: Leandrew Koyanagi, MD;  Location: [facility] ORS;  Service: Ophthalmology;  Laterality: Left;  [place] 01:35 AP% 9.9 CDE 12.6 Fluid pack lot # [id number]  . DIALYSIS/PERMA CATHETER INSERTION N/A 10/11/2018   Procedure: DIALYSIS/PERMA CATHETER INSERTION;  Surgeon: Algernon Huxley, MD;  Location: [facility];  Service: Cardiovascular;  Laterality: N/A;  . EYE SURGERY    . INGUINAL HERNIA REPAIR  as a baby  . TONSILLECTOMY      Prior to  Admission medications   Medication Sig Start Date End Date Taking? Authorizing Provider  albuterol (PROAIR HFA) 108 (90 Base) MCG/ACT inhaler Inhale 2 puffs into the lungs every 6 (six) hours as needed. For  Wheezing.    [provider]  albuterol (PROVENTIL) (2.5 MG/3ML) 0.083% nebulizer solution Take 2.5 mg by nebulization every 6 (six) hours as needed for wheezing or shortness of breath.     [provider]  benazepril (LOTENSIN) 40 MG tablet Take 40 mg by mouth daily.    [provider]  Cholecalciferol (VITAMIN D-3) 25 MCG (1000 UT) CAPS Take 1,000 Units by mouth daily.     [provider]  fluticasone (FLONASE) 50 MCG/ACT nasal spray Place 1 spray into both nostrils 2 (two) times daily as needed for allergies or rhinitis. Reported on 11/30/2015    [provider]  furosemide (LASIX) 40 MG tablet Take 1 tablet (40 mg total) by mouth every other day. 11/09/18   Ronnell Freshwater, NP  HYDROcodone-acetaminophen (NORCO) 5-325 MG tablet Take 1-2 tablets by mouth every 6 (six) hours as needed for moderate pain or severe pain. 11/15/18   Schnier, Dolores Lory, MD  levocetirizine (XYZAL) 5 MG tablet Take 1 tablet (5 mg total) by mouth daily. 04/10/18   Lavera Guise, MD  metoprolol tartrate (LOPRESSOR) 25 MG tablet TAKE 1 TABLET TWICE A DAY Patient taking differently: Take 25 mg by mouth 2 (two) times daily.  09/26/17   Ronnell Freshwater, NP  mometasone-formoterol (DULERA) 100-5 MCG/ACT AERO Inhale 2 puffs into the lungs 2 (two) times daily as needed for wheezing or shortness of breath.     [provider]  NOVOFINE 32G X 6 MM MISC USE AS DIRECTED 04/10/18   Ronnell Freshwater, NP  omeprazole (PRILOSEC) 20 MG capsule Take 20 mg by mouth daily as needed (heartburn).     [provider]  OXYGEN Inhale 2 L/min into the lungs at bedtime.    [provider]  Spacer/Aero-Holding Chambers (AEROCHAMBER PLUS WITH MASK) inhaler 1 each by Other route once. Use as instructed    [provider]  VELTASSA 8.4 g packet Take 8.4 g by mouth every evening.  10/05/17   [provider]  VICTOZA 18 MG/3ML SOPN Inject 0.3 mLs (1.8 mg total) into the skin every morning. Patient taking differently: Inject 1.2 mg into the skin every morning.  06/16/18   Ronnell Freshwater, NP    Allergies Novocain [procaine]  Family History  Problem Relation Age  of Onset  . Diabetes Mellitus II Mother   . CAD Father   . Hypertension Father     Social History Social History   Tobacco Use  . Smoking status: Never Smoker  . Smokeless tobacco: Never Used  Substance Use Topics  . Alcohol use: No  . Drug use: No    Review of Systems  Constitutional: Negative for fever. + AMS \\Cardiovascular : Negative for chest pain. Respiratory: Negative for shortness of breath. Gastrointestinal: + diffuse abdominal pain and decreased appetite. No vomiting or diarrhea. Neuro: +HA ____________________________________________   PHYSICAL EXAM:  VITAL SIGNS: Vitals:   10/30/2018 1915 11/22/2018 1940  BP: (!) 76/55 (!) 83/61  Pulse: 97 95  Resp:  (!) 31  Temp:    SpO2: 94% 92%    Constitutional: Awake, somnolent, arousable by name calling, no distress HEENT:      Head: Normocephalic and atraumatic.         Eyes: Conjunctivae are normal. Sclera  is non-icteric.       Mouth/Throat: Mucous membranes are dry.       Neck: Supple with no signs of meningismus. Cardiovascular: Regular rate and rhythm. No murmurs, gallops, or rubs. 2+ symmetrical distal pulses are present in all extremities. No JVD. Respiratory: Normal respiratory effort. Lungs are clear to auscultation bilaterally. No wheezes, crackles, or rhonchi.  Gastrointestinal: Obese, soft, non tender, and non distended with positive bowel sounds. No rebound or guarding. Musculoskeletal: Nontender with normal range of motion in all extremities. No edema, cyanosis, or erythema of extremities. Neurologic: Face is symmetric. Moving all extremities.  Skin: Skin is warm, dry and intact. No rash noted. Psychiatric: Mood and affect are normal. Speech and behavior are normal.  ____________________________________________   LABS (all labs ordered are listed, but only abnormal results are displayed)  Labs Reviewed  GLUCOSE, CAPILLARY - Abnormal; Notable for the following components:      Result Value    Glucose-Capillary 165 (*)    All other components within normal limits  CBC WITH DIFFERENTIAL/PLATELET  COMPREHENSIVE METABOLIC PANEL  LACTIC ACID, PLASMA  INFLUENZA PANEL BY PCR (TYPE A & B)  BLOOD GAS, VENOUS  CBG MONITORING, ED   ____________________________________________  EKG  ED ECG REPORT I, Rudene Re, the attending physician, personally viewed and interpreted this ECG.  Normal sinus rhythm, rate of 93, incomplete right bundle branch block, prolonged QTC, normal axis, diffuse ST depressions, with minimal elevation in aVR.  These findings are new when compared to prior from 2013. ____________________________________________  RADIOLOGY  I have personally reviewed the images performed during this visit and I agree with the Radiologist's read.   Interpretation by Radiologist:  Dg Chest 1 View  Result Date: 11/03/2018 CLINICAL DATA:  Cough EXAM: CHEST  1 VIEW COMPARISON:  11/30/2015 FINDINGS: Right-sided central venous catheter tip over the right atrium. Cardiomegaly with vascular congestion and mild pulmonary edema. IMPRESSION: Cardiomegaly with vascular congestion and mild pulmonary edema. Right-sided central venous catheter tip over the proximal right atrium. Electronically Signed   By: Donavan Foil M.D.   On: 11/01/2018 19:50   Ct Head Wo Contrast  Result Date: 10/30/2018 CLINICAL DATA:  Encephalopathy EXAM: CT HEAD WITHOUT CONTRAST TECHNIQUE: Contiguous axial images were obtained from the base of the skull through the vertex without intravenous contrast. COMPARISON:  None. FINDINGS: Brain: No acute territorial infarction, hemorrhage, or intracranial mass. The ventricles are nonenlarged. Vascular: No hyperdense vessels.  Carotid vascular calcification Skull: Normal. Negative for fracture or focal lesion. Sinuses/Orbits: Retention cyst in the right maxillary sinus Other: None IMPRESSION: Negative non contrasted CT appearance of the brain Electronically Signed   By: Donavan Foil M.D.   On: 11/24/2018 19:49      ____________________________________________   PROCEDURES  Procedure(s) performed: None Procedures Critical Care performed:  Yes  CRITICAL CARE Performed by: Rudene Re  ?  Total critical care time: 35 min  Critical care time was exclusive of separately billable procedures and treating other patients.  Critical care was necessary to treat or prevent imminent or life-threatening deterioration.  Critical care was time spent personally by me on the following activities: development of treatment plan with patient and/or surrogate as well as nursing, discussions with consultants, evaluation of patient's response to treatment, examination of patient, obtaining history from patient or surrogate, ordering and performing treatments and interventions, ordering and review of laboratory studies, ordering and review of radiographic studies, pulse oximetry and re-evaluation of patient's condition.  ____________________________________________   INITIAL IMPRESSION /  ASSESSMENT AND PLAN / ED COURSE  48 y.o. female with a history of asthma, Down syndrome, ESRD on HD from nephrotic syndrome, pulmonary hypertension, sleep apnea on nasal cannula oxygen, hypertension who presents for evaluation of altered mental status x 2 days since receiving general anesthesia for AV fistula 2 days ago.  Patient is somnolent but arousable, moving all extremities, face is symmetric, she looks very dry on exam, she is afebrile, she has normal work of breathing and is satting well on 2 L nasal cannula which is her baseline.  She is complaining of abdominal pain but has no tenderness throughout.  She is also complaining of a headache.  History is very limited due to patient's history of Down syndrome and altered mental status.  Differential diagnosis is broad and includes infection versus dehydration versus electrolyte abnormalities versus delirium versus stroke versus head  bleed versus DKA.  We will give gentle hydration, check labs, head ct, VBG.    _________________________ 8:07 PM on 11/16/2018 -----------------------------------------  CT head negative for acute findings. CXR with pulmonary edema. No new O2 requirement. EKG with diffuse STE but no STE which are new when compared to prior. Labs pending. Patient is a hard stick and awaiting IV team for access. Plan to admit after labs. Care transferred to Dr. Cinda Quest.    As part of my medical decision making, I reviewed the following data within the [place] History obtained from family, Nursing notes reviewed and incorporated, Labs reviewed , EKG interpreted , Old EKG reviewed, Old chart reviewed, Radiograph reviewed , Discussed with admitting physician , Notes from prior ED visits and [place] Controlled Substance Database    Pertinent labs & imaging results that were available during my care of the patient were reviewed by me and considered in my medical decision making (see chart for details).    ____________________________________________   FINAL CLINICAL IMPRESSION(S) / ED DIAGNOSES  Final diagnoses:  Altered mental status, unspecified altered mental status type  Abnormal EKG      NEW MEDICATIONS STARTED DURING THIS VISIT:  ED Discharge Orders    None       Note:  This document was prepared using Dragon voice recognition software and may include unintentional dictation errors.    Alfred Levins, [place], MD 11/18/18 [place], [place], MD 12/01/18 [phone]

## 2018-11-17 NOTE — ED Notes (Signed)
RTT unable to obtain ABG, x 2 attempts.

## 2018-11-17 NOTE — ED Triage Notes (Signed)
Weakness since Wed. Pt had AV fistula placed in LUE. Since procedure, pt has been weak, w/ AMS. Pt c/o headache presently. Pt seen by home health today to check on status of fistula, mother reports pt "went out", unable to stand on her own, unable to remain awake w/o stimulation. Pt is unable to keep her eyes open during triage.

## 2018-11-17 NOTE — ED Notes (Signed)
Pt is obtunded.

## 2018-11-18 DIAGNOSIS — J9602 Acute respiratory failure with hypercapnia: Principal | ICD-10-CM

## 2018-11-18 DIAGNOSIS — Z6841 Body Mass Index (BMI) 40.0 and over, adult: Secondary | ICD-10-CM | POA: Diagnosis not present

## 2018-11-18 DIAGNOSIS — J9601 Acute respiratory failure with hypoxia: Secondary | ICD-10-CM | POA: Diagnosis present

## 2018-11-18 DIAGNOSIS — K219 Gastro-esophageal reflux disease without esophagitis: Secondary | ICD-10-CM | POA: Diagnosis present

## 2018-11-18 DIAGNOSIS — R05 Cough: Secondary | ICD-10-CM | POA: Diagnosis present

## 2018-11-18 DIAGNOSIS — I951 Orthostatic hypotension: Secondary | ICD-10-CM | POA: Diagnosis not present

## 2018-11-18 DIAGNOSIS — K5900 Constipation, unspecified: Secondary | ICD-10-CM | POA: Diagnosis present

## 2018-11-18 DIAGNOSIS — I132 Hypertensive heart and chronic kidney disease with heart failure and with stage 5 chronic kidney disease, or end stage renal disease: Secondary | ICD-10-CM | POA: Diagnosis present

## 2018-11-18 DIAGNOSIS — G4733 Obstructive sleep apnea (adult) (pediatric): Secondary | ICD-10-CM | POA: Diagnosis present

## 2018-11-18 DIAGNOSIS — I5022 Chronic systolic (congestive) heart failure: Secondary | ICD-10-CM | POA: Diagnosis present

## 2018-11-18 DIAGNOSIS — Z992 Dependence on renal dialysis: Secondary | ICD-10-CM | POA: Diagnosis not present

## 2018-11-18 DIAGNOSIS — I5082 Biventricular heart failure: Secondary | ICD-10-CM | POA: Diagnosis present

## 2018-11-18 DIAGNOSIS — D631 Anemia in chronic kidney disease: Secondary | ICD-10-CM | POA: Diagnosis present

## 2018-11-18 DIAGNOSIS — I469 Cardiac arrest, cause unspecified: Secondary | ICD-10-CM | POA: Diagnosis not present

## 2018-11-18 DIAGNOSIS — I472 Ventricular tachycardia: Secondary | ICD-10-CM | POA: Diagnosis not present

## 2018-11-18 DIAGNOSIS — Y832 Surgical operation with anastomosis, bypass or graft as the cause of abnormal reaction of the patient, or of later complication, without mention of misadventure at the time of the procedure: Secondary | ICD-10-CM | POA: Diagnosis not present

## 2018-11-18 DIAGNOSIS — I2729 Other secondary pulmonary hypertension: Secondary | ICD-10-CM | POA: Diagnosis present

## 2018-11-18 DIAGNOSIS — J449 Chronic obstructive pulmonary disease, unspecified: Secondary | ICD-10-CM | POA: Diagnosis present

## 2018-11-18 DIAGNOSIS — N186 End stage renal disease: Secondary | ICD-10-CM | POA: Diagnosis not present

## 2018-11-18 DIAGNOSIS — T8241XA Breakdown (mechanical) of vascular dialysis catheter, initial encounter: Secondary | ICD-10-CM | POA: Diagnosis not present

## 2018-11-18 DIAGNOSIS — N2581 Secondary hyperparathyroidism of renal origin: Secondary | ICD-10-CM | POA: Diagnosis present

## 2018-11-18 DIAGNOSIS — Q909 Down syndrome, unspecified: Secondary | ICD-10-CM | POA: Diagnosis not present

## 2018-11-18 DIAGNOSIS — E872 Acidosis: Secondary | ICD-10-CM | POA: Diagnosis present

## 2018-11-18 DIAGNOSIS — T82898A Other specified complication of vascular prosthetic devices, implants and grafts, initial encounter: Secondary | ICD-10-CM | POA: Diagnosis not present

## 2018-11-18 DIAGNOSIS — E1122 Type 2 diabetes mellitus with diabetic chronic kidney disease: Secondary | ICD-10-CM | POA: Diagnosis present

## 2018-11-18 DIAGNOSIS — I071 Rheumatic tricuspid insufficiency: Secondary | ICD-10-CM | POA: Diagnosis present

## 2018-11-18 LAB — CBC
HCT: 41.2 % (ref 36.0–46.0)
Hemoglobin: 13 g/dL (ref 12.0–15.0)
MCH: 28.7 pg (ref 26.0–34.0)
MCHC: 31.6 g/dL (ref 30.0–36.0)
MCV: 90.9 fL (ref 80.0–100.0)
Platelets: 229 10*3/uL (ref 150–400)
RBC: 4.53 MIL/uL (ref 3.87–5.11)
RDW: 13.7 % (ref 11.5–15.5)
WBC: 11.3 10*3/uL — ABNORMAL HIGH (ref 4.0–10.5)
nRBC: 0.2 % (ref 0.0–0.2)

## 2018-11-18 LAB — BLOOD GAS, VENOUS
Acid-Base Excess: 3.3 mmol/L — ABNORMAL HIGH (ref 0.0–2.0)
Bicarbonate: 29.1 mmol/L — ABNORMAL HIGH (ref 20.0–28.0)
Delivery systems: POSITIVE
FIO2: 0.4
O2 Saturation: 96.4 %
Patient temperature: 37
pCO2, Ven: 48 mmHg (ref 44.0–60.0)
pH, Ven: 7.39 (ref 7.250–7.430)
pO2, Ven: 86 mmHg — ABNORMAL HIGH (ref 32.0–45.0)

## 2018-11-18 LAB — BASIC METABOLIC PANEL
Anion gap: 7 (ref 5–15)
BUN: 22 mg/dL — ABNORMAL HIGH (ref 6–20)
CO2: 28 mmol/L (ref 22–32)
Calcium: 8.8 mg/dL — ABNORMAL LOW (ref 8.9–10.3)
Chloride: 100 mmol/L (ref 98–111)
Creatinine, Ser: 4.43 mg/dL — ABNORMAL HIGH (ref 0.44–1.00)
GFR calc Af Amer: 13 mL/min — ABNORMAL LOW (ref >60)
GFR calc non Af Amer: 11 mL/min — ABNORMAL LOW (ref >60)
Glucose, Bld: 130 mg/dL — ABNORMAL HIGH (ref 70–99)
Potassium: 4.1 mmol/L (ref 3.5–5.1)
Sodium: 135 mmol/L (ref 135–145)

## 2018-11-18 LAB — GLUCOSE, CAPILLARY
GLUCOSE-CAPILLARY: 127 mg/dL — AB (ref 70–99)
Glucose-Capillary: 125 mg/dL — ABNORMAL HIGH (ref 70–99)
Glucose-Capillary: 112 mg/dL — ABNORMAL HIGH (ref 70–99)
Glucose-Capillary: 166 mg/dL — ABNORMAL HIGH (ref 70–99)
Glucose-Capillary: 116 mg/dL — ABNORMAL HIGH (ref 70–99)

## 2018-11-18 LAB — LACTIC ACID, PLASMA: Lactic Acid, Venous: 1.6 mmol/L (ref 0.5–1.9)

## 2018-11-18 LAB — MRSA PCR SCREENING: MRSA by PCR: NEGATIVE

## 2018-11-18 MED ORDER — FUROSEMIDE 20 MG PO TABS: 40.0000 mg | ORAL_TABLET | ORAL | Status: DC

## 2018-11-18 MED ORDER — HEPARIN SODIUM (PORCINE) 5000 UNIT/ML IJ SOLN
5000.0000 [IU] | Freq: Three times a day (TID) | INTRAMUSCULAR | Status: DC
  Administered 2018-11-18 – 2018-11-29 (×28): 5000 [IU] via SUBCUTANEOUS
  Filled 2018-11-18 (×29): qty 1

## 2018-11-18 MED ORDER — ACETAMINOPHEN 650 MG RE SUPP: 650.0000 mg | Freq: Four times a day (QID) | RECTAL | Status: DC | PRN

## 2018-11-18 MED ORDER — ACETAMINOPHEN 325 MG PO TABS
650.0000 mg | ORAL_TABLET | Freq: Four times a day (QID) | ORAL | Status: DC | PRN
  Administered 2018-11-19 – 2018-11-25 (×9): 650 mg via ORAL
  Filled 2018-11-18 (×9): qty 2

## 2018-11-18 MED ORDER — VANCOMYCIN HCL 10 G IV SOLR
1500.0000 mg | Freq: Once | INTRAVENOUS | Status: AC
  Administered 2018-11-18: 1500 mg via INTRAVENOUS
  Filled 2018-11-18: qty 1500

## 2018-11-18 MED ORDER — ONDANSETRON HCL 4 MG/2ML IJ SOLN
4.0000 mg | Freq: Four times a day (QID) | INTRAMUSCULAR | Status: DC | PRN
  Filled 2018-11-18: qty 2

## 2018-11-18 MED ORDER — INSULIN ASPART 100 UNIT/ML ~~LOC~~ SOLN: 0.0000 [IU] | Freq: Every day | SUBCUTANEOUS | Status: DC

## 2018-11-18 MED ORDER — INSULIN ASPART 100 UNIT/ML ~~LOC~~ SOLN
0.0000 [IU] | Freq: Three times a day (TID) | SUBCUTANEOUS | Status: DC
  Administered 2018-11-18: 2 [IU] via SUBCUTANEOUS
  Administered 2018-11-18: 1 [IU] via SUBCUTANEOUS
  Administered 2018-11-19: 2 [IU] via SUBCUTANEOUS
  Administered 2018-11-19 – 2018-11-20 (×3): 1 [IU] via SUBCUTANEOUS
  Administered 2018-11-21 (×2): 2 [IU] via SUBCUTANEOUS
  Administered 2018-11-24 (×2): 1 [IU] via SUBCUTANEOUS
  Administered 2018-11-25 – 2018-11-27 (×5): 2 [IU] via SUBCUTANEOUS
  Filled 2018-11-18 (×16): qty 1

## 2018-11-18 MED ORDER — VANCOMYCIN HCL IN DEXTROSE 750-5 MG/150ML-% IV SOLN: 750.0000 mg | INTRAVENOUS | Status: DC | PRN

## 2018-11-18 MED ORDER — VANCOMYCIN HCL IN DEXTROSE 750-5 MG/150ML-% IV SOLN: 750.0000 mg | INTRAVENOUS | Status: DC

## 2018-11-18 MED ORDER — SODIUM CHLORIDE 0.9 % IV SOLN
2.0000 g | Freq: Once | INTRAVENOUS | Status: AC
  Administered 2018-11-18: 2 g via INTRAVENOUS
  Filled 2018-11-18: qty 2

## 2018-11-18 MED ORDER — SODIUM CHLORIDE 0.9 % IV SOLN: 2.0000 g | INTRAVENOUS | Status: DC | PRN

## 2018-11-18 MED ORDER — SODIUM CHLORIDE 0.9 % IV BOLUS
500.0000 mL | Freq: Once | INTRAVENOUS | Status: AC
  Administered 2018-11-18: 500 mL via INTRAVENOUS

## 2018-11-18 MED ORDER — PANTOPRAZOLE SODIUM 20 MG PO TBEC
20.0000 mg | DELAYED_RELEASE_TABLET | Freq: Every day | ORAL | Status: DC
  Administered 2018-11-18 – 2018-11-29 (×11): 20 mg via ORAL
  Filled 2018-11-18 (×12): qty 1

## 2018-11-18 MED ORDER — ONDANSETRON HCL 4 MG PO TABS: 4.0000 mg | ORAL_TABLET | Freq: Four times a day (QID) | ORAL | Status: DC | PRN

## 2018-11-18 MED ORDER — PATIROMER SORBITEX CALCIUM 8.4 G PO PACK: 8.4000 g | PACK | Freq: Every evening | ORAL | Status: DC

## 2018-11-18 MED ORDER — MOMETASONE FURO-FORMOTEROL FUM 100-5 MCG/ACT IN AERO
2.0000 | INHALATION_SPRAY | Freq: Two times a day (BID) | RESPIRATORY_TRACT | Status: DC
  Administered 2018-11-18 – 2018-11-29 (×23): 2 via RESPIRATORY_TRACT
  Filled 2018-11-18 (×2): qty 8.8

## 2018-11-18 MED ORDER — SODIUM CHLORIDE 0.9 % IV SOLN: 2.0000 g | INTRAVENOUS | Status: DC

## 2018-11-18 NOTE — Progress Notes (Addendum)
Patient was seen by me earlier this morning.  By the time I rounded on her her cognition had returned to normal.  Her blood pressure and other vital signs were stable.  Her exam was essentially normal.  Labs were reviewed and there were no major unexpected abnormalities.  She is off BiPAP and hypercarbic respiratory failure has resolved.  There is nothing here to suggest sepsis. An elevated lactate can occur with innumerable medical conditions that have nothing to do with sepsis.   I stopped vancomycin and cefepime. I ordered for transfer to [place] floor.  After transfer, PCCM will sign off. Please call if we can be of further assistance.  She does have a history of OSA and I encourage improved compliance with CPAP or BiPAP during sleep.  She is followed by Dr. Elder Negus as an outpatient.  Please contact him for any pulmonary needs after transfer.     Merton Border, MD PCCM service Mobile [phone] Pager [phone] 11/18/2018 3:58 PM

## 2018-11-18 NOTE — Progress Notes (Signed)
[place], [place] 11/18/18  Subjective:   Patient is known to our practice from outpatient dialysis.  She recently started hemodialysis in mid January.  She underwent an AV fistula placement this past Wednesday Under general anesthesia via.  Her mother reports that she has not been feeling well since her surgery.  She felt weak at home.  Brought in for altered mental status, headache, patient not able to stand on her own and excessive somnolence.  Work-up showed severe acidosis with pH of 6.9 and CO2 of 86.  She was placed on BiPAP and sent to the ICU for monitoring Patient's last dialysis treatment was on Thursday and Friday  Objective:  Vital signs in last 24 hours:  Temp:  [97.5 F (36.4 C)-98.1 F (36.7 C)] 97.5 F (36.4 C) (02/22 0800) Pulse Rate:  [72-97] 88 (02/22 1200) Resp:  [10-31] 17 (02/22 1200) BP: (61-138)/(38-95) 96/75 (02/22 1200) SpO2:  [89 %-100 %] 98 % (02/22 1200) FiO2 (%):  [40 %] 40 % (02/22 0335) Weight:  [107.5 kg-111.8 kg] 111.8 kg (02/22 0335)  Weight change:  Filed Weights   11/18/2018 1915 11/18/18 0335  Weight: 107.5 kg 111.8 kg    Intake/Output:    Intake/Output Summary (Last 24 hours) at 11/18/2018 1413 Last data filed at 11/18/2018 1406 Gross per 24 hour  Intake 1636.19 ml  Output -  Net 1636.19 ml     Physical Exam: General:  Chronically ill-appearing, laying in the bed  HEENT  pale conjunctiva, moist oral mucous membranes  Neck  Short, thick  Pulm/lungs  normal breathing effort, no crackles  CVS/Heart  regular, no rub  Abdomen:   Soft, nontender  Extremities:  No peripheral edema  Neurologic:  Alert, able to follow simple commands  Skin:  No acute rashes  Access:  Right IJ PermCath, left arm new AV fistula, no bruit heard       Basic Metabolic Panel:  Recent Labs  Lab 11/15/18 0646 11/11/2018 2012 11/18/18 0355  NA 139 133* 135  K 4.0 3.9 4.1  CL  --  96* 100  CO2  --  23 28  GLUCOSE 98 189*  130*  BUN  --  20 22*  CREATININE  --  4.01* 4.43*  CALCIUM  --  8.6* 8.8*     CBC: Recent Labs  Lab 11/15/18 0646 11/24/2018 2012 11/18/18 0355  WBC  --  8.7 11.3*  NEUTROABS  --  6.5  --   HGB 13.9 13.3 13.0  HCT 41.0 42.3 41.2  MCV  --  91.0 90.9  PLT  --  233 229     No results found for: HEPBSAG, HEPBSAB, HEPBIGM    Microbiology:  Recent Results (from the past 240 hour(s))  Culture, blood (Routine X 2) w Reflex to ID Panel     Status: None (Preliminary result)   Collection Time: 11/13/2018  8:13 PM  Result Value Ref Range Status   Specimen Description BLOOD RIGHT FATTY CASTS  Final   Special Requests   Final    BOTTLES DRAWN AEROBIC AND ANAEROBIC Blood Culture adequate volume   Culture   Final    NO GROWTH < 12 HOURS Performed at [facility], [address], [place], [place] [address]    Report Status PENDING  Incomplete  MRSA PCR Screening     Status: None   Collection Time: 11/18/18  3:36 AM  Result Value Ref Range Status   MRSA by PCR NEGATIVE NEGATIVE Final  Comment:        The GeneXpert MRSA Assay (FDA approved for NASAL specimens only), is one component of a comprehensive MRSA colonization surveillance program. It is not intended to diagnose MRSA infection nor to guide or monitor treatment for MRSA infections. Performed at [facility], [address] [place], [place] [address]   Culture, blood (Routine X 2) w Reflex to ID Panel     Status: None (Preliminary result)   Collection Time: 11/18/18  3:55 AM  Result Value Ref Range Status   Specimen Description BLOOD RIGHT HAND  Final   Special Requests   Final    BOTTLES DRAWN AEROBIC AND ANAEROBIC Blood Culture adequate volume   Culture   Final    NO GROWTH < 12 HOURS Performed at [facility], [address] [place], [place] [address]    Report Status PENDING  Incomplete    Coagulation Studies: No results for input(s): LABPROT, INR in the last 72  hours.  Urinalysis: No results for input(s): COLORURINE, LABSPEC, PHURINE, GLUCOSEU, HGBUR, BILIRUBINUR, KETONESUR, PROTEINUR, UROBILINOGEN, NITRITE, LEUKOCYTESUR in the last 72 hours.  Invalid input(s): APPERANCEUR    Imaging: Dg Chest 1 View  Result Date: 11/16/2018 CLINICAL DATA:  Cough EXAM: CHEST  1 VIEW COMPARISON:  11/30/2015 FINDINGS: Right-sided central venous catheter tip over the right atrium. Cardiomegaly with vascular congestion and mild pulmonary edema. IMPRESSION: Cardiomegaly with vascular congestion and mild pulmonary edema. Right-sided central venous catheter tip over the proximal right atrium. Electronically Signed   By: Donavan Foil M.D.   On: 11/04/2018 19:50   Ct Head Wo Contrast  Result Date: 11/13/2018 CLINICAL DATA:  Encephalopathy EXAM: CT HEAD WITHOUT CONTRAST TECHNIQUE: Contiguous axial images were obtained from the base of the skull through the vertex without intravenous contrast. COMPARISON:  None. FINDINGS: Brain: No acute territorial infarction, hemorrhage, or intracranial mass. The ventricles are nonenlarged. Vascular: No hyperdense vessels.  Carotid vascular calcification Skull: Normal. Negative for fracture or focal lesion. Sinuses/Orbits: Retention cyst in the right maxillary sinus Other: None IMPRESSION: Negative non contrasted CT appearance of the brain Electronically Signed   By: Donavan Foil M.D.   On: 11/08/2018 19:49     Medications:    . heparin  5,000 Units Subcutaneous Q8H  . insulin aspart  0-5 Units Subcutaneous QHS  . insulin aspart  0-9 Units Subcutaneous TID WC  . mometasone-formoterol  2 puff Inhalation BID  . pantoprazole  20 mg Oral Daily   acetaminophen **OR** acetaminophen, [DISCONTINUED] ondansetron **OR** ondansetron (ZOFRAN) IV  Assessment/ Plan:  48 y.o. African-American female with end-stage renal disease started hemodialysis January 2020, hypertension, proteinuria, Down syndrome, morbid obesity, obstructive sleep apnea, not  using CPAP due to claustrophobia now admitted for hypercarbic acute respiratory failure and generalized weakness  ESRD/MWF/[name] [facility] dialysis/[facility]/111 kg/ 240 min  End-stage renal disease -Patient had her last hemodialysis treatment on Friday.  Electrolytes and volume status are acceptable at present.  No acute indication for dialysis.  We will continue to monitor and assess daily.  Next hemodialysis anticipated on Monday.  Due to severe pulmonary hypertension, will have to be very careful with attempted ultrafiltration  Acute respiratory failure, with hypoxia and hypercapnia Treated with positive pressure ventilation Symptomatically improved now  Hypotension Agree with small boluses to maintain hemodynamic stability May have resulted in clotting of new AV fistula Consider vascular surgery evaluation on Monday  Cardiac history 2D echo October 20, 2018: Normal left ventricular systolic function, mild LVH, severe tricuspid  regurgitation and severe pulmonary hypertension, severely enlarged right atrium    LOS: 0 [place] 2/22/20202:13 PM  [place], [place]  Note: This note was prepared with Dragon dictation. Any transcription errors are unintentional

## 2018-11-18 NOTE — ED Notes (Signed)
Apolonio Schneiders RN, aware of bed assigned

## 2018-11-18 NOTE — H&P (Signed)
[facility] NAME: Michele Bartlett    MR#:  [id number]  DATE OF BIRTH:  [birth year]  DATE OF ADMISSION:  11/05/2018  PRIMARY CARE PHYSICIAN: Lavera Guise, MD   REQUESTING/REFERRING PHYSICIAN: Cinda Quest, MD  CHIEF COMPLAINT:   Chief Complaint  Patient presents with  . Weakness    HISTORY OF PRESENT ILLNESS:  Michele Bartlett  is a 48 y.o. female who presents with chief complaint as above.  Patient presents to the ED obtunded and hypotensive and hypoxic.  Mother states that she has been feeling weak all day, and by this afternoon was lethargic and then subsequently obtunded.  She recently developed end-stage renal disease and was started on dialysis last month.  She went earlier this week for AV fistula placement and required general anesthesia with intubation.  Subsequently her mother states that she had a difficult time recovering from the anesthesia.  She missed a dialysis session on Wednesday, and instead had a done on Thursday and then another session yesterday.  She received small amount of IV fluids initially in the ED and her blood pressure responded quickly.  Venous blood gas showed CO2 very elevated at 89 with pH of 6.9.  She was placed on BiPAP and subsequent blood gas showed correction in this abnormality.  However, when she was tried to wean off BiPAP her O2 sats dropped.  X-ray shows vascular congestion and edema.  Hospitalist were called for admission  PAST MEDICAL HISTORY:   Past Medical History:  Diagnosis Date  . Asthma   . Diabetes mellitus without complication ([place])   . Down's syndrome   . Dysrhythmia   . Edema    FEET/LEG  . Environmental allergies   . Hypertension   . Kidney disease    STAGE 4  . Nephrotic syndrome   . Pneumonia   . Pulmonary hypertension ([place])   . Sleep apnea    mother reports she does not use CPAP because it causes panic attacks  . Wheezing      PAST SURGICAL HISTORY:   Past  Surgical History:  Procedure Laterality Date  . AV FISTULA PLACEMENT Left 11/15/2018   Procedure: ARTERIOVENOUS (AV) FISTULA CREATION (Brachiocephalic);  Surgeon: Katha Cabal, MD;  Location: [facility];  Service: Vascular;  Laterality: Left;  . BREAST BIOPSY Left 03/22/2018   affirm stero biopsy x 2 areas/path pending  . CATARACT EXTRACTION W/PHACO Left 06/15/2018   Procedure: CATARACT EXTRACTION PHACO AND INTRAOCULAR LENS PLACEMENT (IOC);  Surgeon: Leandrew Koyanagi, MD;  Location: [facility];  Service: Ophthalmology;  Laterality: Left;  [place] 01:35 AP% 9.9 CDE 12.6 Fluid pack lot # [id number]  . DIALYSIS/PERMA CATHETER INSERTION N/A 10/11/2018   Procedure: DIALYSIS/PERMA CATHETER INSERTION;  Surgeon: Algernon Huxley, MD;  Location: [facility];  Service: Cardiovascular;  Laterality: N/A;  . EYE SURGERY    . INGUINAL HERNIA REPAIR  as a baby  . TONSILLECTOMY       SOCIAL HISTORY:   Social History   Tobacco Use  . Smoking status: Never Smoker  . Smokeless tobacco: Never Used  Substance Use Topics  . Alcohol use: No     FAMILY HISTORY:   Family History  Problem Relation Age of Onset  . Diabetes Mellitus II Mother   . CAD Father   . Hypertension Father      DRUG ALLERGIES:   Allergies  Allergen Reactions  . Novocain [Procaine] Rash    Skin peel  raw all over    MEDICATIONS AT HOME:   Prior to Admission medications   Medication Sig Start Date End Date Taking? Authorizing Provider  albuterol (PROAIR HFA) 108 (90 Base) MCG/ACT inhaler Inhale 2 puffs into the lungs every 6 (six) hours as needed. For  Wheezing.   Yes [provider]  albuterol (PROVENTIL) (2.5 MG/3ML) 0.083% nebulizer solution Take 2.5 mg by nebulization every 6 (six) hours as needed for wheezing or shortness of breath.   Yes [provider]  benazepril (LOTENSIN) 40 MG tablet Take 40 mg by mouth daily.   Yes [provider]  Cholecalciferol (VITAMIN D-3) 25 MCG (1000 UT)  CAPS Take 1,000 Units by mouth daily.    Yes [provider]  fluticasone (FLONASE) 50 MCG/ACT nasal spray Place 1 spray into both nostrils 2 (two) times daily as needed for allergies or rhinitis. Reported on 11/30/2015   Yes [provider]  furosemide (LASIX) 40 MG tablet Take 1 tablet (40 mg total) by mouth every other day. 11/09/18  Yes Boscia, Greer Ee, NP  HYDROcodone-acetaminophen (NORCO) 5-325 MG tablet Take 1-2 tablets by mouth every 6 (six) hours as needed for moderate pain or severe pain. 11/15/18  Yes Schnier, Dolores Lory, MD  levocetirizine (XYZAL) 5 MG tablet Take 1 tablet (5 mg total) by mouth daily. 04/10/18  Yes Lavera Guise, MD  metoprolol tartrate (LOPRESSOR) 25 MG tablet TAKE 1 TABLET TWICE A DAY Patient taking differently: Take 25 mg by mouth 2 (two) times daily.  09/26/17  Yes Boscia, Greer Ee, NP  mometasone-formoterol (DULERA) 100-5 MCG/ACT AERO Inhale 2 puffs into the lungs 2 (two) times daily as needed for wheezing or shortness of breath.    Yes [provider]  omeprazole (PRILOSEC) 20 MG capsule Take 20 mg by mouth daily as needed (heartburn).    Yes [provider]  OXYGEN Inhale 2 L/min into the lungs at bedtime.   Yes [provider]  Spacer/Aero-Holding Chambers (AEROCHAMBER PLUS WITH MASK) inhaler 1 each by Other route once. Use as instructed   Yes [provider]  VELTASSA 8.4 g packet Take 8.4 g by mouth every evening.  10/05/17  Yes [provider]  VICTOZA 18 MG/3ML SOPN Inject 0.3 mLs (1.8 mg total) into the skin every morning. Patient taking differently: Inject 1.2 mg into the skin every morning.  06/16/18  Yes Ronnell Freshwater, NP  NOVOFINE 32G X 6 MM MISC USE AS DIRECTED 04/10/18   Ronnell Freshwater, NP    REVIEW OF SYSTEMS:  Review of Systems  Unable to perform ROS: Acuity of condition     VITAL SIGNS:   Vitals:   11/18/18 0045 11/18/18 0100 11/18/18 0115 11/18/18 0130  BP: 118/88 122/86 (!)  124/94 126/90  Pulse: 93 92 91 91  Resp: (!) 23 (!) 23 (!) 23 (!) 21  Temp:      TempSrc:      SpO2: 95% 93% 95% 94%  Weight:      Height:       Wt Readings from Last 3 Encounters:  11/18/2018 107.5 kg  11/15/18 110.2 kg  11/10/18 110.2 kg    PHYSICAL EXAMINATION:  Physical Exam  Vitals reviewed. Constitutional: She appears well-developed and well-nourished. No distress.  HENT:  Head: Normocephalic and atraumatic.  Mouth/Throat: Oropharynx is clear and moist.  Eyes: Pupils are equal, round, and reactive to light. Conjunctivae and EOM are normal. No scleral icterus.  Neck: Normal range of motion.  Neck supple. No JVD present. No thyromegaly present.  Cardiovascular: Normal rate, regular rhythm and intact distal pulses. Exam reveals no gallop and no friction rub.  No murmur heard. Respiratory: She is in respiratory distress (On BiPAP). She has no wheezes. She has rales.  GI: Soft. Bowel sounds are normal. She exhibits no distension. There is no abdominal tenderness.  Musculoskeletal: Normal range of motion.        General: No edema.     Comments: No arthritis, no gout  Lymphadenopathy:    She has no cervical adenopathy.  Neurological: She is alert. No cranial nerve deficit.  Unable to fully assess due to condition  Skin: Skin is warm and dry. No rash noted. No erythema.  Psychiatric:  Unable to assess due to condition    LABORATORY PANEL:   CBC Recent Labs  Lab 11/08/2018 2012  WBC 8.7  HGB 13.3  HCT 42.3  PLT 233   ------------------------------------------------------------------------------------------------------------------  Chemistries  Recent Labs  Lab 11/18/2018 2012  NA 133*  K 3.9  CL 96*  CO2 23  GLUCOSE 189*  BUN 20  CREATININE 4.01*  CALCIUM 8.6*  AST 140*  ALT 97*  ALKPHOS 54  BILITOT 2.2*   ------------------------------------------------------------------------------------------------------------------  Cardiac Enzymes No results for  input(s): TROPONINI in the last 168 hours. ------------------------------------------------------------------------------------------------------------------  RADIOLOGY:  Dg Chest 1 View  Result Date: 11/22/2018 CLINICAL DATA:  Cough EXAM: CHEST  1 VIEW COMPARISON:  11/30/2015 FINDINGS: Right-sided central venous catheter tip over the right atrium. Cardiomegaly with vascular congestion and mild pulmonary edema. IMPRESSION: Cardiomegaly with vascular congestion and mild pulmonary edema. Right-sided central venous catheter tip over the proximal right atrium. Electronically Signed   By: Donavan Foil M.D.   On: 11/16/2018 19:50   Ct Head Wo Contrast  Result Date: 10/29/2018 CLINICAL DATA:  Encephalopathy EXAM: CT HEAD WITHOUT CONTRAST TECHNIQUE: Contiguous axial images were obtained from the base of the skull through the vertex without intravenous contrast. COMPARISON:  None. FINDINGS: Brain: No acute territorial infarction, hemorrhage, or intracranial mass. The ventricles are nonenlarged. Vascular: No hyperdense vessels.  Carotid vascular calcification Skull: Normal. Negative for fracture or focal lesion. Sinuses/Orbits: Retention cyst in the right maxillary sinus Other: None IMPRESSION: Negative non contrasted CT appearance of the brain Electronically Signed   By: Donavan Foil M.D.   On: 11/03/2018 19:49    EKG:   Orders placed or performed during the hospital encounter of 11/01/2018  . ED EKG  . ED EKG  . EKG 12-Lead  . EKG 12-Lead    IMPRESSION AND PLAN:  Principal Problem:   Acute respiratory failure with hypoxia and hypercapnia (HCC) -on BiPAP, continue this for now.  Her hypercapnia resolved with the BiPAP.  Will admit her to stepdown unit Active Problems:   Chronic right-sided systolic heart failure -per chart review on her last echocardiogram she had right-sided heart failure, severe, and I suspect she also has pulmonary hypertension.  Cardiology consult   End stage renal disease  [facility]) -patient is on dialysis, will get a nephrology consult for assistance with dialysis   Type 2 diabetes mellitus with diabetic chronic kidney disease ([place]) -sliding scale insulin coverage   Essential (primary) hypertension -continue home meds   Obstructive sleep apnea, adult -on BiPAP right now.  Normally she only wears oxygen at home as she does not tolerate CPAP  Chart review performed and case discussed with ED provider. Labs, imaging and/or ECG reviewed by provider and discussed with patient/family. Management plans discussed  with the patient and/or family.  DVT PROPHYLAXIS: SubQ heparin  GI PROPHYLAXIS:  PPI   ADMISSION STATUS: Inpatient     CODE STATUS: Full Code Status History    Date Active Date Inactive Code Status Order ID Comments User Context   11/30/2015 1419 12/03/2015 1720 Full Code [id number]  Idelle Crouch, MD Inpatient      TOTAL CRITICAL CARE TIME TAKING CARE OF THIS PATIENT: 50 minutes.   Ethlyn Daniels 11/18/2018, 2:01 AM  [facility]  Office  [phone]  CC: Primary care physician; Lavera Guise, MD  Note:  This document was prepared using Dragon voice recognition software and may include unintentional dictation errors.

## 2018-11-18 NOTE — Progress Notes (Signed)
[facility] Progress Note Patient Name: Michele Piloto DOB: [birth year] MRN: [id number]   Date of Service  11/18/2018  HPI/Events of Note  41 F with Downs Syndrome to ED with weakness/AMS/hypoxia.  Found to have HCRF with pH of less than 7.9.  Placed on BiPAP is overall improvement in clinical condition.  Of note patient had recently started IHD for ESRD, dialyzing through a PC.  Recent placement of AVF on 2/19. There was some pulm congestion on PCXR so patient was given lasix  On camera check the patient is HD stable with HR 73, BP 101/76 (84) with RR of 20 and sats of 100% on BiPAP.  eICU Interventions  Plan: Continue to monitor via [place] with BiPAP Broad ABX coverage over concern for potential line sepsis Given ESRD diagnosis not sure how effective lasix will be but has Nephrology and Cardiology consults pending      Intervention Category Evaluation Type: New Patient Evaluation  DETERDING,ELIZABETH 11/18/2018, 4:26 AM

## 2018-11-18 NOTE — Progress Notes (Signed)
Pharmacy Antibiotic Note  Michele Bartlett is a 48 y.o. female admitted on 11/13/2018 with respiratory failure.  Pharmacy has been consulted for vancomycin and cefepime dosing.  Plan: ESRD 1500 mg x1 load dose ordered. 750 mg with every HD. Level will need to be ordered when HD schedule is established.  Cefepime 2 grams now and then with every HD ordered.  Height: 5\' 1"  (154.9 cm) Weight: 237 lb (107.5 kg) IBW/kg (Calculated) : 47.8  Temp (24hrs), Avg:98.1 F (36.7 C), Min:98.1 F (36.7 C), Max:98.1 F (36.7 C)  Recent Labs  Lab 11/12/2018 2012 11/10/2018 2215  WBC 8.7  --   CREATININE 4.01*  --   LATICACIDVEN 3.4* 2.7*    Estimated Creatinine Clearance: 19.6 mL/min (A) (by C-G formula based on SCr of 4.01 mg/dL (H)).    Allergies  Allergen Reactions  . Novocain [Procaine] Rash    Skin peel raw all over    Antimicrobials this admission: Vancomycin, cefepime  >>    >>   Dose adjustments this admission:   Microbiology results: 2/22 BCx: pending 2/22 MRSA PCR: pending   Thank you for allowing pharmacy to be a part of this patient's care.  Evert Wenrich S 11/18/2018 2:45 AM

## 2018-11-18 NOTE — Progress Notes (Signed)
Report given to Janett Billow, RN- patient being transferred to 2A room 243- Patient and family updated on plan of care.

## 2018-11-18 NOTE — Progress Notes (Signed)
Patient was seen and examined in ICU.  Off BiPAP doing fine on room air.  Mom at bedside.  Agree with H&P by the admitting physician reviewed labs and medications.  Patient will be transferred to floor when medically stable and bed is available

## 2018-11-19 LAB — GLUCOSE, CAPILLARY
Glucose-Capillary: 120 mg/dL — ABNORMAL HIGH (ref 70–99)
Glucose-Capillary: 150 mg/dL — ABNORMAL HIGH (ref 70–99)
Glucose-Capillary: 160 mg/dL — ABNORMAL HIGH (ref 70–99)
Glucose-Capillary: 151 mg/dL — ABNORMAL HIGH (ref 70–99)

## 2018-11-19 LAB — MAGNESIUM: Magnesium: 2 mg/dL (ref 1.7–2.4)

## 2018-11-19 MED ORDER — CHLORHEXIDINE GLUCONATE CLOTH 2 % EX PADS
6.0000 | MEDICATED_PAD | Freq: Every day | CUTANEOUS | Status: DC
  Administered 2018-11-20 – 2018-11-29 (×10): 6 via TOPICAL

## 2018-11-19 MED ORDER — ALUM & MAG HYDROXIDE-SIMETH 200-200-20 MG/5ML PO SUSP
30.0000 mL | Freq: Once | ORAL | Status: AC
  Administered 2018-11-19: 30 mL via ORAL
  Filled 2018-11-19: qty 30

## 2018-11-19 MED ORDER — SODIUM CHLORIDE 0.9 % IV SOLN: INTRAVENOUS | Status: DC

## 2018-11-19 MED ORDER — MIDODRINE HCL 5 MG PO TABS
2.5000 mg | ORAL_TABLET | Freq: Three times a day (TID) | ORAL | Status: DC
  Administered 2018-11-20 – 2018-11-21 (×4): 2.5 mg via ORAL
  Filled 2018-11-19 (×4): qty 1

## 2018-11-19 MED ORDER — CALCIUM CARBONATE ANTACID 500 MG PO CHEW: 1.0000 | CHEWABLE_TABLET | Freq: Two times a day (BID) | ORAL | Status: DC | PRN

## 2018-11-19 MED ORDER — ALUM & MAG HYDROXIDE-SIMETH 200-200-20 MG/5ML PO SUSP
30.0000 mL | ORAL | Status: DC | PRN
  Administered 2018-11-23 (×2): 30 mL via ORAL
  Filled 2018-11-19 (×4): qty 30

## 2018-11-19 NOTE — Progress Notes (Signed)
8 beats of v. Tach noted on tele- pt asymptomatic / MD paged to make aware/ will monitor.

## 2018-11-19 NOTE — Progress Notes (Signed)
[place], [place] 11/19/18  Subjective:   Patient is known to our practice from outpatient dialysis.  She recently started hemodialysis in mid January.  She underwent an AV fistula placement this past Wednesday Under general anesthesia via.  Her mother reports that she has not been feeling well since her surgery.  She felt weak at home.  Brought in for altered mental status, headache, patient not able to stand on her own and excessive somnolence.  Work-up showed severe acidosis with pH of 6.9 and CO2 of 86.  She was placed on BiPAP and sent to the ICU for monitoring Last hemodialysis was on Friday. This morning, she feels back to baseline.  Objective:  Vital signs in last 24 hours:  Temp:  [97.4 F (36.3 C)] 97.4 F (36.3 C) (02/22 2033) Pulse Rate:  [78-106] 101 (02/23 1605) Resp:  [18-20] 18 (02/23 1605) BP: (90-146)/(50-115) 135/82 (02/23 1605) SpO2:  [88 %-99 %] 93 % (02/23 1605) Weight:  [110.7 kg] 110.7 kg (02/22 2045)  Weight change: 3.198 kg Filed Weights   11/09/2018 1915 11/18/18 0335 11/18/18 2045  Weight: 107.5 kg 111.8 kg 110.7 kg    Intake/Output:    Intake/Output Summary (Last 24 hours) at 11/19/2018 1640 Last data filed at 11/19/2018 1239 Gross per 24 hour  Intake 240 ml  Output 0 ml  Net 240 ml     Physical Exam: General:  Chronically ill-appearing, laying in the bed  HEENT  pale conjunctiva, moist oral mucous membranes  Neck  Short, thick  Pulm/lungs  normal breathing effort, no crackles  CVS/Heart  regular, no rub  Abdomen:   Soft, nontender  Extremities:  No peripheral edema  Neurologic:  Alert, able to follow simple commands  Skin:  No acute rashes  Access:  Right IJ PermCath, left arm new AV fistula, no bruit heard       Basic Metabolic Panel:  Recent Labs  Lab 11/15/18 0646 11/02/2018 2012 11/18/18 0355  NA 139 133* 135  K 4.0 3.9 4.1  CL  --  96* 100  CO2  --  23 28  GLUCOSE 98 189* 130*  BUN  --  20 22*   CREATININE  --  4.01* 4.43*  CALCIUM  --  8.6* 8.8*     CBC: Recent Labs  Lab 11/15/18 0646 11/24/2018 2012 11/18/18 0355  WBC  --  8.7 11.3*  NEUTROABS  --  6.5  --   HGB 13.9 13.3 13.0  HCT 41.0 42.3 41.2  MCV  --  91.0 90.9  PLT  --  233 229     No results found for: HEPBSAG, HEPBSAB, HEPBIGM    Microbiology:  Recent Results (from the past 240 hour(s))  Culture, blood (Routine X 2) w Reflex to ID Panel     Status: None (Preliminary result)   Collection Time: 11/19/2018  8:13 PM  Result Value Ref Range Status   Specimen Description BLOOD RIGHT FATTY CASTS  Final   Special Requests   Final    BOTTLES DRAWN AEROBIC AND ANAEROBIC Blood Culture adequate volume   Culture   Final    NO GROWTH 1 DAY Performed at [facility], [address] [place], [place] [address]    Report Status PENDING  Incomplete  MRSA PCR Screening     Status: None   Collection Time: 11/18/18  3:36 AM  Result Value Ref Range Status   MRSA by PCR NEGATIVE NEGATIVE Final    Comment:  The GeneXpert MRSA Assay (FDA approved for NASAL specimens only), is one component of a comprehensive MRSA colonization surveillance program. It is not intended to diagnose MRSA infection nor to guide or monitor treatment for MRSA infections. Performed at [facility], [address] [place], [place] [address]   Culture, blood (Routine X 2) w Reflex to ID Panel     Status: None (Preliminary result)   Collection Time: 11/18/18  3:55 AM  Result Value Ref Range Status   Specimen Description BLOOD RIGHT HAND  Final   Special Requests   Final    BOTTLES DRAWN AEROBIC AND ANAEROBIC Blood Culture adequate volume   Culture   Final    NO GROWTH 1 DAY Performed at [facility], [address] [place], [place] [address]    Report Status PENDING  Incomplete    Coagulation Studies: No results for input(s): LABPROT, INR in the last 72 hours.  Urinalysis: No results for  input(s): COLORURINE, LABSPEC, PHURINE, GLUCOSEU, HGBUR, BILIRUBINUR, KETONESUR, PROTEINUR, UROBILINOGEN, NITRITE, LEUKOCYTESUR in the last 72 hours.  Invalid input(s): APPERANCEUR    Imaging: Dg Chest 1 View  Result Date: 11/04/2018 CLINICAL DATA:  Cough EXAM: CHEST  1 VIEW COMPARISON:  11/30/2015 FINDINGS: Right-sided central venous catheter tip over the right atrium. Cardiomegaly with vascular congestion and mild pulmonary edema. IMPRESSION: Cardiomegaly with vascular congestion and mild pulmonary edema. Right-sided central venous catheter tip over the proximal right atrium. Electronically Signed   By: Donavan Foil M.D.   On: 11/01/2018 19:50   Ct Head Wo Contrast  Result Date: 10/31/2018 CLINICAL DATA:  Encephalopathy EXAM: CT HEAD WITHOUT CONTRAST TECHNIQUE: Contiguous axial images were obtained from the base of the skull through the vertex without intravenous contrast. COMPARISON:  None. FINDINGS: Brain: No acute territorial infarction, hemorrhage, or intracranial mass. The ventricles are nonenlarged. Vascular: No hyperdense vessels.  Carotid vascular calcification Skull: Normal. Negative for fracture or focal lesion. Sinuses/Orbits: Retention cyst in the right maxillary sinus Other: None IMPRESSION: Negative non contrasted CT appearance of the brain Electronically Signed   By: Donavan Foil M.D.   On: 10/31/2018 19:49     Medications:    . heparin  5,000 Units Subcutaneous Q8H  . insulin aspart  0-5 Units Subcutaneous QHS  . insulin aspart  0-9 Units Subcutaneous TID WC  . mometasone-formoterol  2 puff Inhalation BID  . pantoprazole  20 mg Oral Daily   acetaminophen **OR** acetaminophen, [DISCONTINUED] ondansetron **OR** ondansetron (ZOFRAN) IV  Assessment/ Plan:  48 y.o. African-American female with end-stage renal disease started hemodialysis January 2020, hypertension, proteinuria, Down syndrome, morbid obesity, obstructive sleep apnea, not using CPAP due to claustrophobia now  admitted for hypercarbic acute respiratory failure and generalized weakness  ESRD/MWF/[name] [facility] dialysis/[facility]/111 kg/ 240 min  End-stage renal disease -Patient had her last hemodialysis treatment on Friday.  Electrolytes and volume status are acceptable at present.  No acute indication for dialysis.  We will continue to monitor and assess daily.  Next hemodialysis anticipated on Monday.  Due to severe pulmonary hypertension, will have to be very careful with attempted ultrafiltration  Acute respiratory failure, with hypoxia and hypercapnia -Treated with positive pressure ventilation -Symptomatically improved now -Encouraged CPAP use at home  Hypotension -Agree with small boluses to maintain hemodynamic stability -May have resulted in clotting of new AV fistula -Consider vascular surgery evaluation on Monday  Cardiac history 2D echo October 20, 2018: Normal left ventricular systolic function, mild LVH, severe tricuspid regurgitation and severe pulmonary  hypertension, severely enlarged right atrium    LOS: Export 2/23/20204:40 PM  [place], [place]  Note: This note was prepared with Dragon dictation. Any transcription errors are unintentional

## 2018-11-19 NOTE — Progress Notes (Addendum)
[facility] [place] NAME: Michele Bartlett    MR#:  [id number]  DATE OF BIRTH:  October 14, 1970  SUBJECTIVE:  CHIEF COMPLAINT: Patient is feeling fine but reporting dizziness orthostatic blood pressure was positive.  Per telemetry 7-8 beats of nonsustained V. tach.  Mom and dad at bedside  REVIEW OF SYSTEMS:  CONSTITUTIONAL: No fever, fatigue or weakness.  EYES: No blurred or double vision.  EARS, NOSE, AND THROAT: No tinnitus or ear pain.  RESPIRATORY: No cough, shortness of breath, wheezing or hemoptysis.  CARDIOVASCULAR: No chest pain, orthopnea, edema.  GASTROINTESTINAL: No nausea, vomiting, diarrhea or abdominal pain.  GENITOURINARY: No dysuria, hematuria.  ENDOCRINE: No polyuria, nocturia,  HEMATOLOGY: No anemia, easy bruising or bleeding SKIN: No rash or lesion. MUSCULOSKELETAL: No joint pain or arthritis.   NEUROLOGIC: No tingling, numbness, weakness.  PSYCHIATRY: No anxiety or depression.   DRUG ALLERGIES:   Allergies  Allergen Reactions  . Novocain [Procaine] Rash    Skin peel raw all over    VITALS:  Blood pressure 106/86, pulse (!) 102, temperature 98.9 F (37.2 C), resp. rate 20, height 5\' 1"  (1.549 m), weight 110.7 kg, last menstrual period 11/02/2018, SpO2 94 %.  PHYSICAL EXAMINATION:  GENERAL:  48 y.o.-year-old patient lying in the bed with no acute distress.  EYES: Pupils equal, round, reactive to light and accommodation. No scleral icterus. Extraocular muscles intact.  HEENT: Head atraumatic, normocephalic. Oropharynx and nasopharynx clear.  NECK:  Supple, no jugular venous distention. No thyroid enlargement, no tenderness.  LUNGS: Normal breath sounds bilaterally, no wheezing, rales,rhonchi or crepitation. No use of accessory muscles of respiration.  CARDIOVASCULAR: S1, S2 normal. No murmurs, rubs, or gallops.  ABDOMEN: Soft, nontender, nondistended. Bowel sounds present.  EXTREMITIES: No pedal edema, cyanosis,  or clubbing.  NEUROLOGIC: Awake, alert and oriented x3. Sensation intact. Gait not checked.  PSYCHIATRIC: The patient is alert and oriented x 3.  SKIN: No obvious rash, lesion, or ulcer.    LABORATORY PANEL:   CBC Recent Labs  Lab 11/18/18 0355  WBC 11.3*  HGB 13.0  HCT 41.2  PLT 229   ------------------------------------------------------------------------------------------------------------------  Chemistries  Recent Labs  Lab 11/02/2018 2012 11/18/18 0355 11/19/18 1828  NA 133* 135  --   K 3.9 4.1  --   CL 96* 100  --   CO2 23 28  --   GLUCOSE 189* 130*  --   BUN 20 22*  --   CREATININE 4.01* 4.43*  --   CALCIUM 8.6* 8.8*  --   MG  --   --  2.0  AST 140*  --   --   ALT 97*  --   --   ALKPHOS 54  --   --   BILITOT 2.2*  --   --    ------------------------------------------------------------------------------------------------------------------  Cardiac Enzymes No results for input(s): TROPONINI in the last 168 hours. ------------------------------------------------------------------------------------------------------------------  RADIOLOGY:  No results found.  EKG:   Orders placed or performed during the hospital encounter of 11/11/2018  . ED EKG  . ED EKG  . EKG 12-Lead  . EKG 12-Lead    ASSESSMENT AND PLAN:   #Acute hypoxic respiratory failure and hypercapnia -secondary to noncompliance with the CPAP off BiPAP Clinically improving Reinforced the importance of being compliant with CPAP.  Patient can try nasal mask with chinstrap Has appointment with [facility] pulmonology dr.fortin on February 25 Requiring 2 L of oxygen via nasal cannula continuous at this time [facility]  to discharge patient from pulmonology standpoint at [facility]  #Nonsustained V. Tach Spontaneously resolved Electrolytes are in normal range Cardiology consult placed to Dr. Clayborn Bigness text message sent via haiku  #Orthostatic hypotension Patient is a dialysis patient will try  Midodrine  #End-stage renal disease on hemodialysis-nephrology is following during the hospital course  #Chronic right-sided congestive heart failure No exacerbation at this time  #Essential hypertension not on any medications Blood pressure is okay  #Obstructive sleep apnea continue CPAP nightly  All the records are reviewed and case discussed with Care Management/Social Workerr. Management plans discussed with the patient, family and they are in agreement.  CODE STATUS: fc   TOTAL TIME TAKING CARE OF THIS PATIENT: 37  minutes.   POSSIBLE D/C IN 1-2  DAYS, DEPENDING ON CLINICAL CONDITION.  Note: This dictation was prepared with Dragon dictation along with smaller phrase technology. Any transcriptional errors that result from this process are unintentional.   Nicholes Mango M.D on 11/19/2018 at 10:39 PM  Between 7am to 6pm - Pager - [phone] After 6pm go to [email or web address] - password [email or web address] [facility]  [facility]  Office  [phone]  CC: Primary care physician; Lavera Guise, MD

## 2018-11-20 LAB — CBC

## 2018-11-20 LAB — GLUCOSE, CAPILLARY
GLUCOSE-CAPILLARY: 136 mg/dL — AB (ref 70–99)
Glucose-Capillary: 126 mg/dL — ABNORMAL HIGH (ref 70–99)
Glucose-Capillary: 112 mg/dL — ABNORMAL HIGH (ref 70–99)

## 2018-11-20 LAB — HIV ANTIBODY (ROUTINE TESTING W REFLEX): HIV Screen 4th Generation wRfx: NONREACTIVE

## 2018-11-20 MED ORDER — ALPRAZOLAM 0.5 MG PO TABS
0.5000 mg | ORAL_TABLET | Freq: Every evening | ORAL | Status: DC | PRN
  Administered 2018-11-21 – 2018-11-28 (×6): 0.5 mg via ORAL
  Filled 2018-11-20 (×6): qty 1

## 2018-11-20 NOTE — Consult Note (Signed)
Reason for Consult: Respiratory failure congestive heart failure Referring Physician: Dr. Lance Coon hospitalist Dr. Clayborn Bigness primary  Michele Bartlett is an 48 y.o. female.  HPI: Patient is a 48 year old black female history of Down syndrome presented with hypotensive hypoxic respiratory failure episode requiring BiPAP.  The patient had been lethargic all afternoon and then became obtunded she was recently developed end-stage renal disease and was started on dialysis about a month ago.  Patient went in earlier this week for AV fistula placement, and got routine general anesthesia with intubation.  Patient subsequently showed a difficult time with anesthesia.  Patient missed dialysis session on Wednesday 1 on Thursday and then another 1 on Friday.  Patient is to respond with IV fluids initially emergency room because of low blood pressure but her CO2 was close to 90 pH was less than 7 patient was placed on aggressive BiPAP to help with respiratory failure and she appeared to respond but chest x-ray suggested pulmonary congestion possible congestive heart failure.  Patient denied any significant chest pain history is difficult because of her Down syndrome and current mental state.  Diabetes appears to have been reasonably controlled she is on inhalers at home denies any fever chills or sweats no significant palpitations or tachycardia  Past Medical History:  Diagnosis Date  . Asthma   . Diabetes mellitus without complication ([place])   . Down's syndrome   . Dysrhythmia   . Edema    FEET/LEG  . Environmental allergies   . Hypertension   . Kidney disease    STAGE 4  . Nephrotic syndrome   . Pneumonia   . Pulmonary hypertension ([place])   . Sleep apnea    mother reports she does not use CPAP because it causes panic attacks  . Wheezing     Past Surgical History:  Procedure Laterality Date  . AV FISTULA PLACEMENT Left 11/15/2018   Procedure: ARTERIOVENOUS (AV) FISTULA CREATION (Brachiocephalic);   Surgeon: Katha Cabal, MD;  Location: [facility] ORS;  Service: Vascular;  Laterality: Left;  . BREAST BIOPSY Left 03/22/2018   affirm stero biopsy x 2 areas/path pending  . CATARACT EXTRACTION W/PHACO Left 06/15/2018   Procedure: CATARACT EXTRACTION PHACO AND INTRAOCULAR LENS PLACEMENT (IOC);  Surgeon: Leandrew Koyanagi, MD;  Location: [facility] ORS;  Service: Ophthalmology;  Laterality: Left;  [place] 01:35 AP% 9.9 CDE 12.6 Fluid pack lot # [id number]  . DIALYSIS/PERMA CATHETER INSERTION N/A 10/11/2018   Procedure: DIALYSIS/PERMA CATHETER INSERTION;  Surgeon: Algernon Huxley, MD;  Location: [facility];  Service: Cardiovascular;  Laterality: N/A;  . EYE SURGERY    . INGUINAL HERNIA REPAIR  as a baby  . TONSILLECTOMY      Family History  Problem Relation Age of Onset  . Diabetes Mellitus II Mother   . CAD Father   . Hypertension Father     Social History:  reports that she has never smoked. She has never used smokeless tobacco. She reports that she does not drink alcohol or use drugs.  Allergies:  Allergies  Allergen Reactions  . Novocain [Procaine] Rash    Skin peel raw all over    Medications: I have reviewed the patient's current medications.  Results for orders placed or performed during the hospital encounter of 11/24/2018 (from the past 48 hour(s))  Glucose, capillary     Status: Abnormal   Collection Time: 11/18/18 11:55 AM  Result Value Ref Range   Glucose-Capillary 166 (H) 70 - 99 mg/dL  Glucose, capillary  Status: Abnormal   Collection Time: 11/18/18  4:44 PM  Result Value Ref Range   Glucose-Capillary 127 (H) 70 - 99 mg/dL  Glucose, capillary     Status: Abnormal   Collection Time: 11/18/18  8:56 PM  Result Value Ref Range   Glucose-Capillary 116 (H) 70 - 99 mg/dL  Glucose, capillary     Status: Abnormal   Collection Time: 11/19/18  7:58 AM  Result Value Ref Range   Glucose-Capillary 120 (H) 70 - 99 mg/dL  Glucose, capillary     Status: Abnormal   Collection  Time: 11/19/18 11:46 AM  Result Value Ref Range   Glucose-Capillary 150 (H) 70 - 99 mg/dL  Glucose, capillary     Status: Abnormal   Collection Time: 11/19/18  4:02 PM  Result Value Ref Range   Glucose-Capillary 160 (H) 70 - 99 mg/dL  Magnesium     Status: None   Collection Time: 11/19/18  6:28 PM  Result Value Ref Range   Magnesium 2.0 1.7 - 2.4 mg/dL    Comment: Performed at [facility], [address] [place], [place] [address]  Glucose, capillary     Status: Abnormal   Collection Time: 11/19/18  8:45 PM  Result Value Ref Range   Glucose-Capillary 151 (H) 70 - 99 mg/dL  CBC     Status: None   Collection Time: 11/20/18  5:45 AM  Result Value Ref Range   WBC  4.0 - 10.5 K/uL    QUESTIONABLE IDENTIFICATION / INCORRECTLY LABELED SPECIMEN    Comment: NOTIFIED JESSICA CHRISTMAS ON 11/20/2018 AT [facility] ON 02/24 AT 1517: PREVIOUSLY REPORTED AS 8.9    RBC  3.87 - 5.11 MIL/uL    QUESTIONABLE IDENTIFICATION / INCORRECTLY LABELED SPECIMEN    Comment: CORRECTED ON 02/24 AT 6160: PREVIOUSLY REPORTED AS 3.57   Hemoglobin  12.0 - 15.0 g/dL    QUESTIONABLE IDENTIFICATION / INCORRECTLY LABELED SPECIMEN    Comment: CORRECTED ON 02/24 AT 7371: PREVIOUSLY REPORTED AS 9.7   HCT  36.0 - 46.0 %    QUESTIONABLE IDENTIFICATION / INCORRECTLY LABELED SPECIMEN    Comment: CORRECTED ON 02/24 AT 0626: PREVIOUSLY REPORTED AS 30.0   MCV  80.0 - 100.0 fL    QUESTIONABLE IDENTIFICATION / INCORRECTLY LABELED SPECIMEN    Comment: CORRECTED ON 02/24 AT 9485: PREVIOUSLY REPORTED AS 84.0   MCH  26.0 - 34.0 pg    QUESTIONABLE IDENTIFICATION / INCORRECTLY LABELED SPECIMEN    Comment: CORRECTED ON 02/24 AT 4627: PREVIOUSLY REPORTED AS 27.2   MCHC  30.0 - 36.0 g/dL    QUESTIONABLE IDENTIFICATION / INCORRECTLY LABELED SPECIMEN    Comment: CORRECTED ON 02/24 AT 0350: PREVIOUSLY REPORTED AS 32.3   RDW  11.5 - 15.5 %    QUESTIONABLE IDENTIFICATION / INCORRECTLY LABELED SPECIMEN    Comment:  CORRECTED ON 02/24 AT 0938: PREVIOUSLY REPORTED AS 15.7   Platelets  150 - 400 K/uL    QUESTIONABLE IDENTIFICATION / INCORRECTLY LABELED SPECIMEN    Comment: Performed at [facility], [address] [place], [place] [address] CORRECTED ON 02/24 AT 3716: PREVIOUSLY REPORTED AS 147   Glucose, capillary     Status: Abnormal   Collection Time: 11/20/18  8:16 AM  Result Value Ref Range   Glucose-Capillary 136 (H) 70 - 99 mg/dL    No results found.  Review of Systems  Constitutional: Positive for diaphoresis and malaise/fatigue.  HENT: Positive for congestion.   Eyes: Negative.   Respiratory: Positive for  shortness of breath and wheezing.   Cardiovascular: Positive for orthopnea, leg swelling and PND.  Gastrointestinal: Negative.   Genitourinary: Negative.   Musculoskeletal: Positive for myalgias.  Skin: Negative.   Neurological: Positive for dizziness and weakness.  Endo/Heme/Allergies: Negative.   Psychiatric/Behavioral: Negative.    Blood pressure 117/90, pulse 94, temperature (!) 97.3 F (36.3 C), temperature source Oral, resp. rate 18, height 5\' 1"  (1.549 m), weight 114.1 kg, last menstrual period 11/02/2018, SpO2 100 %. Physical Exam  Nursing note and vitals reviewed. Constitutional: She is oriented to person, place, and time. She appears well-developed and well-nourished.  HENT:  Head: Normocephalic and atraumatic.  Eyes: Pupils are equal, round, and reactive to light. Conjunctivae and EOM are normal.  Neck: Normal range of motion. Neck supple.  Cardiovascular: Normal rate and regular rhythm.  Murmur heard. Respiratory: Effort normal and breath sounds normal.  GI: Soft. Bowel sounds are normal.  Musculoskeletal: Normal range of motion.        General: Edema present.  Neurological: She is alert and oriented to person, place, and time. She has normal reflexes.  Skin: Skin is warm and dry.  Psychiatric: She has a normal mood and affect.     Assessment/Plan: Weakness Shortness of breath Diabetes Down syndrome Edema Respiratory failure Hypoxemia Congestive heart failure End-stage renal disease on dialysis Pulmonary hypertension Obstructive sleep apnea COPD /asthma GERD . Plan Agree with admit to ICU for respiratory failure continue BiPAP and diuresis Supplemental oxygen therapy for hypoxemia Diuretic therapy for chronic right-sided systolic heart failure Continue therapy for pulmonary hypertension including supplemental oxygen diuretics Continue aggressive dialysis treatment Maintain home medications for hypertension Agree with diabetes management with sliding scale reinstitute Victoza Continue BiPAP or CPAP for obstructive sleep apnea therapy Recommend weight loss if possible Consider repeat echocardiogram for evaluation of right and left heart function Consider antiarrhythmic therapy with amiodarone if any further ventricular ectopy Shaneeka Scarboro D Tahari Clabaugh 11/20/2018, 9:02 AM

## 2018-11-20 NOTE — Progress Notes (Signed)
Pre HD TX   11/20/18 0930  Vital Signs  Temp 97.9 F (36.6 C)  Temp Source Oral  Pulse Rate 69  Pulse Rate Source Monitor  Resp (!) 22  BP 108/75  BP Location Right Arm  BP Method Automatic  Patient Position (if appropriate) Lying  Oxygen Therapy  SpO2 98 %  O2 Device Nasal Cannula  O2 Flow Rate (L/min) 2 L/min  Pain Assessment  Pain Scale 0-10  Pain Score 0  Dialysis Weight  Weight 114.1 kg  Type of Weight Pre-Dialysis  Time-Out for Hemodialysis  What Procedure? hemodialysis  Pt Identifiers(min of two) First/Last Name;MRN/Account#  Correct Site? Yes  Correct Side? Yes  Correct Procedure? Yes  Consents Verified? Yes  Rad Studies Available? No  Safety Precautions Reviewed? Yes  Engineer, civil (consulting) Number 2  Station Number 2  UF/Alarm Test Passed  Conductivity: Meter 14  Conductivity: Machine  14  pH 7.2  Reverse Osmosis main  Normal Saline Lot Number [id number]  Dialyzer Lot Number [id number]  Disposable Set Lot Number [id number]  Machine Temperature 98.6 F (37 C)  Musician and Audible Yes  Blood Lines Intact and Secured Yes  Pre Treatment Patient Checks  Vascular access used during treatment Catheter  Hepatitis B Surface Antigen Results Negative  Hepatitis B Surface Antibody  (<10)  Hemodialysis Consent Verified Yes  Hemodialysis Standing Orders Initiated Yes  ECG (Telemetry) Monitor On Yes  Prime Ordered Normal Saline  Length of  DialysisTreatment -hour(s) 3 Hour(s)  Dialyzer Elisio 17H NR  Dialysate 3K, 2.5 Ca  Dialysis Anticoagulant Heparin  Dialysate Flow Ordered 600  Blood Flow Rate Ordered 400 mL/min  Ultrafiltration Goal 0.5 Liters  Pre Treatment Labs Renal panel;CBC  Dialysis Blood Pressure Support Ordered Normal Saline  Education / Care Plan  Dialysis Education Provided Yes  Documented Education in Care Plan Yes  Fistula / Graft Left Forearm Arteriovenous fistula  Placement Date/Time: 11/15/18 0924   Placed prior to admission: No   Orientation: Left  Access Location: Forearm  Access Type: Arteriovenous fistula  Site Condition No complications  Status Other (Comment) (currently not being used )  Drainage Description None  Hemodialysis Catheter Right Subclavian Double-lumen  No Placement Date or Time found.   Placed prior to admission: Yes  Orientation: Right  Access Location: Subclavian  Hemodialysis Catheter Type: Double-lumen  Site Condition No complications  Blue Lumen Status Capped (Central line)  Red Lumen Status Capped (Central line)  Purple Lumen Status N/A  Dressing Type Gauze/Drain sponge  Dressing Status Clean;Dry;Intact  Drainage Description None

## 2018-11-20 NOTE — Progress Notes (Signed)
HD Tx End   11/20/18 1330  Vital Signs  Pulse Rate 92  BP 136/89  Oxygen Therapy  SpO2 100 %  During Hemodialysis Assessment  Blood Flow Rate (mL/min) 400 mL/min  Arterial Pressure (mmHg) -120 mmHg  Venous Pressure (mmHg) 90 mmHg  Transmembrane Pressure (mmHg) 70 mmHg  Ultrafiltration Rate (mL/min) 330 mL/min  Dialysate Flow Rate (mL/min) 600 ml/min  Conductivity: Machine  13.8  HD Safety Checks Performed Yes  KECN 62.7 KECN  Dialysis Fluid Bolus Normal Saline  Bolus Amount (mL) 250 mL  Intra-Hemodialysis Comments Tx completed

## 2018-11-20 NOTE — Progress Notes (Signed)
HD Tx Start   11/20/18 1000  Vital Signs  Temp 97.9 F (36.6 C)  Temp Source Oral  Pulse Rate 69  Resp (!) 22  BP 102/70  Oxygen Therapy  SpO2 95 %  During Hemodialysis Assessment  Blood Flow Rate (mL/min) 200 mL/min  Arterial Pressure (mmHg) -80 mmHg  Venous Pressure (mmHg) 90 mmHg  Transmembrane Pressure (mmHg) 40 mmHg  Ultrafiltration Rate (mL/min) 330 mL/min  Dialysate Flow Rate (mL/min) 600 ml/min  Conductivity: Machine  13.8  HD Safety Checks Performed Yes  Dialysis Fluid Bolus Normal Saline  Bolus Amount (mL) 250 mL  Intra-Hemodialysis Comments Tx initiated

## 2018-11-20 NOTE — Progress Notes (Signed)
Post HD Tx    11/20/18 1345  Vital Signs  Pulse Rate 90  BP (!) 111/91  Oxygen Therapy  SpO2 100 %  Post-Hemodialysis Assessment  Rinseback Volume (mL) 250 mL  KECN 62.7 V  Dialyzer Clearance Lightly streaked  Duration of HD Treatment -hour(s) 3 hour(s)  Hemodialysis Intake (mL) 500 mL  UF Total -Machine (mL) 500 mL  Net UF (mL) 0 mL  Tolerated HD Treatment Yes  Hemodialysis Catheter Right Subclavian Double-lumen  No Placement Date or Time found.   Placed prior to admission: Yes  Orientation: Right  Access Location: Subclavian  Hemodialysis Catheter Type: Double-lumen  Site Condition No complications  Blue Lumen Status Flushed;Capped (Central line);Heparin locked  Red Lumen Status Flushed;Capped (Central line);Heparin locked  Purple Lumen Status N/A  Catheter fill solution Heparin 1000 units/ml  Catheter fill volume (Arterial) 1.5 cc  Catheter fill volume (Venous) 1.5  Dressing Type Biopatch  Dressing Status Clean;Intact;Dry;Antimicrobial disc changed;Dressing changed  Interventions Dressing changed  Drainage Description None  Post treatment catheter status Capped and Clamped

## 2018-11-20 NOTE — Progress Notes (Signed)
Pre HD Tx Assessment   11/20/18 1000  Neurological  Level of Consciousness Alert  Orientation Level Oriented X4  Respiratory  Respiratory Pattern Labored  Bilateral Breath Sounds Diminished  Cardiac  ECG Monitor Yes  Cardiac Rhythm NSR  Vascular  R Radial Pulse +2  L Radial Pulse +2  Musculoskeletal  Musculoskeletal (WDL) X  Generalized Weakness Yes  Psychosocial  Psychosocial (WDL) WDL

## 2018-11-20 NOTE — Progress Notes (Signed)
Post Tx Assessment   11/20/18 1345  Neurological  Level of Consciousness Alert  Orientation Level Oriented X4  Respiratory  Respiratory Pattern Regular  Bilateral Breath Sounds Diminished  Cardiac  ECG Monitor Yes  Cardiac Rhythm NSR  Vascular  R Radial Pulse +2  L Radial Pulse +2  Musculoskeletal  Musculoskeletal (WDL) X  Generalized Weakness Yes  Psychosocial  Psychosocial (WDL) WDL

## 2018-11-20 NOTE — Progress Notes (Signed)
[place], [place] 11/20/18  Subjective:  Patient seen and evaluated during hemodialysis. Already well. Currently awake and alert this a.m.   Objective:  Vital signs in last 24 hours:  Temp:  [97.3 F (36.3 C)-98.9 F (37.2 C)] 97.9 F (36.6 C) (02/24 1000) Pulse Rate:  [69-106] 69 (02/24 1000) Resp:  [18-22] 20 (02/24 1015) BP: (90-146)/(50-115) 102/70 (02/24 1000) SpO2:  [91 %-100 %] 98 % (02/24 1015) FiO2 (%):  [24 %] 24 % (02/23 1701) Weight:  [114.1 kg] 114.1 kg (02/24 0930)  Weight change: 3.425 kg Filed Weights   11/18/18 2045 11/19/18 1911 11/20/18 0930  Weight: 110.7 kg 114.1 kg 114.1 kg    Intake/Output:    Intake/Output Summary (Last 24 hours) at 11/20/2018 1214 Last data filed at 11/20/2018 0957 Gross per 24 hour  Intake 120 ml  Output 0 ml  Net 120 ml     Physical Exam: General:  Chronically ill-appearing, laying in the bed  HEENT  pale conjunctiva, moist oral mucous membranes  Neck  Short, thick  Pulm/lungs  normal breathing effort, no crackles  CVS/Heart  regular, no rub  Abdomen:   Soft, nontender  Extremities:  No peripheral edema  Neurologic:  Alert, able to follow simple commands  Skin:  No acute rashes  Access:  Right IJ PermCath, left arm new AV fistula       Basic Metabolic Panel:  Recent Labs  Lab 11/15/18 0646 11/16/2018 2012 11/18/18 0355 11/19/18 1828  NA 139 133* 135  --   K 4.0 3.9 4.1  --   CL  --  96* 100  --   CO2  --  23 28  --   GLUCOSE 98 189* 130*  --   BUN  --  20 22*  --   CREATININE  --  4.01* 4.43*  --   CALCIUM  --  8.6* 8.8*  --   MG  --   --   --  2.0     CBC: Recent Labs  Lab 11/15/18 0646 11/24/2018 2012 11/18/18 0355 11/20/18 0545  WBC  --  8.7 11.3* QUESTIONABLE IDENTIFICATION / INCORRECTLY LABELED SPECIMEN  NEUTROABS  --  6.5  --   --   HGB 13.9 13.3 13.0 QUESTIONABLE IDENTIFICATION / INCORRECTLY LABELED SPECIMEN  HCT 41.0 42.3 41.2 QUESTIONABLE IDENTIFICATION /  INCORRECTLY LABELED SPECIMEN  MCV  --  91.0 90.9 QUESTIONABLE IDENTIFICATION / INCORRECTLY LABELED SPECIMEN  PLT  --  233 229 QUESTIONABLE IDENTIFICATION / INCORRECTLY LABELED SPECIMEN     No results found for: HEPBSAG, HEPBSAB, HEPBIGM    Microbiology:  Recent Results (from the past 240 hour(s))  Culture, blood (Routine X 2) w Reflex to ID Panel     Status: None (Preliminary result)   Collection Time: 11/24/2018  8:13 PM  Result Value Ref Range Status   Specimen Description BLOOD RIGHT FATTY CASTS  Final   Special Requests   Final    BOTTLES DRAWN AEROBIC AND ANAEROBIC Blood Culture adequate volume   Culture   Final    NO GROWTH 2 DAYS Performed at [facility], [address] [place], [place] [address]    Report Status PENDING  Incomplete  MRSA PCR Screening     Status: None   Collection Time: 11/18/18  3:36 AM  Result Value Ref Range Status   MRSA by PCR NEGATIVE NEGATIVE Final    Comment:        The GeneXpert MRSA Assay (FDA approved  for NASAL specimens only), is one component of a comprehensive MRSA colonization surveillance program. It is not intended to diagnose MRSA infection nor to guide or monitor treatment for MRSA infections. Performed at [facility], [address] [place], [place] [address]   Culture, blood (Routine X 2) w Reflex to ID Panel     Status: None (Preliminary result)   Collection Time: 11/18/18  3:55 AM  Result Value Ref Range Status   Specimen Description BLOOD RIGHT HAND  Final   Special Requests   Final    BOTTLES DRAWN AEROBIC AND ANAEROBIC Blood Culture adequate volume   Culture   Final    NO GROWTH 2 DAYS Performed at [facility], [address] [place], [place] [address]    Report Status PENDING  Incomplete    Coagulation Studies: No results for input(s): LABPROT, INR in the last 72 hours.  Urinalysis: No results for input(s): COLORURINE, LABSPEC, PHURINE, GLUCOSEU, HGBUR, BILIRUBINUR, KETONESUR,  PROTEINUR, UROBILINOGEN, NITRITE, LEUKOCYTESUR in the last 72 hours.  Invalid input(s): APPERANCEUR    Imaging: No results found.   Medications:    . Chlorhexidine Gluconate Cloth  6 each Topical Q0600  . heparin  5,000 Units Subcutaneous Q8H  . insulin aspart  0-5 Units Subcutaneous QHS  . insulin aspart  0-9 Units Subcutaneous TID WC  . midodrine  2.5 mg Oral TID WC  . mometasone-formoterol  2 puff Inhalation BID  . pantoprazole  20 mg Oral Daily   acetaminophen **OR** acetaminophen, ALPRAZolam, alum & mag hydroxide-simeth, calcium carbonate, [DISCONTINUED] ondansetron **OR** ondansetron (ZOFRAN) IV  Assessment/ Plan:  48 y.o. African-American female with end-stage renal disease started hemodialysis January 2020, hypertension, proteinuria, Down syndrome, morbid obesity, obstructive sleep apnea, not using CPAP due to claustrophobia now admitted for hypercarbic acute respiratory failure and generalized weakness  ESRD/MWF/[name] [facility] dialysis/[facility]/111 kg/ 240 min  End-stage renal disease -patient seen and evaluated during hemodialysis.  Thus far tolerating well.  We plan to complete dialysis treatment today.  Acute respiratory failure, with hypoxia and hypercapnia -Treated with positive pressure ventilation -seems to be back to her baseline mental status.  Hypotension -Agree with small boluses to maintain hemodynamic stability -blood pressure currently 102/70.  Cardiac history 2D echo October 20, 2018: Normal left ventricular systolic function, mild LVH, severe tricuspid regurgitation and severe pulmonary hypertension, severely enlarged right atrium    LOS: 2 Michele Bartlett 2/24/202012:14 PM  [place], [place]  Note: This note was prepared with Dragon dictation. Any transcription errors are unintentional

## 2018-11-20 NOTE — Progress Notes (Signed)
PT Cancellation Note  Patient Details Name: Michele Bartlett MRN: [id number] DOB: 12/10/1970   Cancelled Treatment:    Reason Eval/Treat Not Completed: Patient at procedure or test/unavailable.  Pt currently off floor at dialysis.  Will re-attempt PT evaluation at a later date/time.  Leitha Bleak, PT 11/20/18, 1:18 PM [phone]

## 2018-11-20 NOTE — Progress Notes (Signed)
[place] at [place] NAME: Michele Bartlett    MR#:  [id number]  DATE OF BIRTH:  May 10, 1971  SUBJECTIVE:  CHIEF COMPLAINT: Patient is feeling ok , anxious with cpap mask ,had HD today but reporting dizziness orthostatic blood pressure was positive.   Mom and dad at bedside  REVIEW OF SYSTEMS:  CONSTITUTIONAL: No fever, fatigue or weakness.  EYES: No blurred or double vision.  EARS, NOSE, AND THROAT: No tinnitus or ear pain.  RESPIRATORY: No cough, shortness of breath, wheezing or hemoptysis.  CARDIOVASCULAR: No chest pain, orthopnea, edema.  GASTROINTESTINAL: No nausea, vomiting, diarrhea or abdominal pain.  GENITOURINARY: No dysuria, hematuria.  ENDOCRINE: No polyuria, nocturia,  HEMATOLOGY: No anemia, easy bruising or bleeding SKIN: No rash or lesion. MUSCULOSKELETAL: No joint pain or arthritis.   NEUROLOGIC: No tingling, numbness, weakness.  PSYCHIATRY: No anxiety or depression.   DRUG ALLERGIES:   Allergies  Allergen Reactions  . Novocain [Procaine] Rash    Skin peel raw all over    VITALS:  Blood pressure (!) 139/96, pulse 92, temperature 97.8 F (36.6 C), temperature source Oral, resp. rate 18, height 5\' 1"  (1.549 m), weight 114.1 kg, last menstrual period 11/02/2018, SpO2 100 %.  PHYSICAL EXAMINATION:  GENERAL:  48 y.o.-year-old patient lying in the bed with no acute distress.  EYES: Pupils equal, round, reactive to light and accommodation. No scleral icterus. Extraocular muscles intact.  HEENT: Head atraumatic, normocephalic. Oropharynx and nasopharynx clear.  NECK:  Supple, no jugular venous distention. No thyroid enlargement, no tenderness.  LUNGS: Normal breath sounds bilaterally, no wheezing, rales,rhonchi or crepitation. No use of accessory muscles of respiration.  CARDIOVASCULAR: S1, S2 normal. No murmurs, rubs, or gallops.  ABDOMEN: Soft, nontender, nondistended. Bowel sounds present.  EXTREMITIES: No pedal  edema, cyanosis, or clubbing.  NEUROLOGIC: Awake, alert and oriented x3. Sensation intact. Gait not checked.  PSYCHIATRIC: The patient is alert and oriented x 3.  SKIN: No obvious rash, lesion, or ulcer.    LABORATORY PANEL:   CBC Recent Labs  Lab 11/20/18 0545  WBC QUESTIONABLE IDENTIFICATION / INCORRECTLY LABELED SPECIMEN  HGB QUESTIONABLE IDENTIFICATION / INCORRECTLY LABELED SPECIMEN  HCT QUESTIONABLE IDENTIFICATION / INCORRECTLY LABELED SPECIMEN  PLT QUESTIONABLE IDENTIFICATION / INCORRECTLY LABELED SPECIMEN   ------------------------------------------------------------------------------------------------------------------  Chemistries  Recent Labs  Lab 11/14/2018 2012 11/18/18 0355 11/19/18 1828  NA 133* 135  --   K 3.9 4.1  --   CL 96* 100  --   CO2 23 28  --   GLUCOSE 189* 130*  --   BUN 20 22*  --   CREATININE 4.01* 4.43*  --   CALCIUM 8.6* 8.8*  --   MG  --   --  2.0  AST 140*  --   --   ALT 97*  --   --   ALKPHOS 54  --   --   BILITOT 2.2*  --   --    ------------------------------------------------------------------------------------------------------------------  Cardiac Enzymes No results for input(s): TROPONINI in the last 168 hours. ------------------------------------------------------------------------------------------------------------------  RADIOLOGY:  No results found.  EKG:   Orders placed or performed during the hospital encounter of 11/03/2018  . ED EKG  . ED EKG  . EKG 12-Lead  . EKG 12-Lead    ASSESSMENT AND PLAN:   #Acute hypoxic respiratory failure and hypercapnia -secondary to noncompliance with the CPAP off BiPAP Clinically improving Reinforced the importance of being compliant with CPAP.  Patient can try nasal mask with  chinstrap Has appointment with [facility] pulmonology dr.fortin on February 25 Requiring 2 L of oxygen via nasal cannula continuous at this time Okay to discharge patient from pulmonology standpoint at  [facility]  #Nonsustained V. Tach Spontaneously resolved Electrolytes are in normal range Cardiology consult placed to Dr. Clayborn Bigness has seen the patient, recommending to repeat echocardiogram Recommending to consider antiarrhythmic therapy with amiodarone if any further ventricular ectopy  #Orthostatic hypotension Patient is a dialysis patient will try Midodrine  #End-stage renal disease on hemodialysis-nephrology is following during the hospital course Patient had hemodialysis today Vascular consult for clotted AV fistula  #Chronic right-sided congestive heart failure No exacerbation at this time  #Essential hypertension not on any medications Blood pressure is okay  #Obstructive sleep apnea continue CPAP nightly  #Generalized weakness PT consult pending  All the records are reviewed and case discussed with Care Management/Social Workerr. Management plans discussed with the patient, family and they are in agreement.  CODE STATUS: fc   TOTAL TIME TAKING CARE OF THIS PATIENT: 48  minutes.   POSSIBLE D/C IN 1-2  DAYS, DEPENDING ON CLINICAL CONDITION.  Note: This dictation was prepared with Dragon dictation along with smaller phrase technology. Any transcriptional errors that result from this process are unintentional.   Nicholes Mango M.D on 11/20/2018 at 9:05 PM  Between 7am to 6pm - Pager - [phone] After 6pm go to [email or web address] - password [email or web address] [facility]  [place] [facility]  Office  [phone]  CC: Primary care physician; Lavera Guise, MD

## 2018-11-20 NOTE — Progress Notes (Signed)
Pt refused to wear BIPAP last night. No distress noted.

## 2018-11-21 ENCOUNTER — Other Ambulatory Visit (INDEPENDENT_AMBULATORY_CARE_PROVIDER_SITE_OTHER): Payer: Self-pay | Admitting: Vascular Surgery

## 2018-11-21 LAB — GLUCOSE, CAPILLARY
GLUCOSE-CAPILLARY: 156 mg/dL — AB (ref 70–99)
Glucose-Capillary: 195 mg/dL — ABNORMAL HIGH (ref 70–99)
Glucose-Capillary: 113 mg/dL — ABNORMAL HIGH (ref 70–99)

## 2018-11-21 MED ORDER — SENNA 8.6 MG PO TABS: 1.0000 | ORAL_TABLET | Freq: Every evening | ORAL | Status: DC | PRN

## 2018-11-21 MED ORDER — MIDAZOLAM HCL 2 MG/ML PO SYRP
8.0000 mg | ORAL_SOLUTION | Freq: Once | ORAL | Status: DC | PRN
  Filled 2018-11-21: qty 4

## 2018-11-21 MED ORDER — MIDODRINE HCL 5 MG PO TABS
5.0000 mg | ORAL_TABLET | Freq: Three times a day (TID) | ORAL | Status: DC
  Administered 2018-11-21 – 2018-11-29 (×19): 5 mg via ORAL
  Filled 2018-11-21 (×23): qty 1

## 2018-11-21 MED ORDER — AMIODARONE HCL 200 MG PO TABS
200.0000 mg | ORAL_TABLET | Freq: Two times a day (BID) | ORAL | Status: DC
  Administered 2018-11-21 – 2018-11-29 (×15): 200 mg via ORAL
  Filled 2018-11-21 (×15): qty 1

## 2018-11-21 MED ORDER — ONDANSETRON HCL 4 MG/2ML IJ SOLN: 4.0000 mg | Freq: Four times a day (QID) | INTRAMUSCULAR | Status: DC | PRN

## 2018-11-21 MED ORDER — FAMOTIDINE 20 MG PO TABS: 40.0000 mg | ORAL_TABLET | ORAL | Status: DC | PRN

## 2018-11-21 MED ORDER — DIPHENHYDRAMINE HCL 50 MG/ML IJ SOLN: 50.0000 mg | Freq: Once | INTRAMUSCULAR | Status: DC | PRN

## 2018-11-21 MED ORDER — HYDROMORPHONE HCL 1 MG/ML IJ SOLN
1.0000 mg | Freq: Once | INTRAMUSCULAR | Status: DC | PRN
  Filled 2018-11-21: qty 1

## 2018-11-21 MED ORDER — CEFAZOLIN SODIUM-DEXTROSE 1-4 GM/50ML-% IV SOLN
1.0000 g | INTRAVENOUS | Status: AC
  Filled 2018-11-21: qty 50

## 2018-11-21 MED ORDER — DOCUSATE SODIUM 100 MG PO CAPS
100.0000 mg | ORAL_CAPSULE | Freq: Two times a day (BID) | ORAL | Status: DC
  Administered 2018-11-21 – 2018-11-29 (×13): 100 mg via ORAL
  Filled 2018-11-21 (×13): qty 1

## 2018-11-21 MED ORDER — METHYLPREDNISOLONE SODIUM SUCC 125 MG IJ SOLR: 125.0000 mg | INTRAMUSCULAR | Status: DC | PRN

## 2018-11-21 MED ORDER — SODIUM CHLORIDE 0.9 % IV SOLN
INTRAVENOUS | Status: DC
  Administered 2018-11-22: 17:00:00 via INTRAVENOUS

## 2018-11-21 NOTE — Evaluation (Signed)
Physical Therapy Evaluation Patient Details Name: Michele Bartlett MRN: [id number] DOB: 06/27/1971 Today's Date: 11/21/2018   History of Present Illness  48 y.o. female who presents with chief complaint of weakness, arrived obtunded and hypotensive and hypoxic.  She recently developed end-stage renal disease and was started on dialysis last month, recent AV fistula placement and required general anesthesia with intubation.  Subsequently her mother states that she had a difficult time recovering from the anesthesia.  She missed a dialysis session on Wednesday, and instead had a done on Thursday.  Clinical Impression  Pt slow to do much with PT, but with repeated cuing and encouragement she was able to participate with modest activity. She needed cuing and assist with all mobility (scooting in bed, transition to sitting, multiple standing attempts) but overall was quite limited compared to her baseline.  Per today's performance she is not ready/safe to be able to go home even with 24/7 assist and likely would need rolling walker.  She had low BP t/o the session with some symptomatic dizziness that seemed orthostatic in nature. (BPs taken in sitting after sitting up 70/50, after dizziness had subsided 96/73, after transition to recliner 77/60).  PT suspects that as symptoms clear she should be able to return home with HHPT, but per today's performance she is not safe and would need to go to STR at this point.      Follow Up Recommendations Supervision/Assistance - 24 hour;SNF(hoping to improve enough to go home with HHPT)    Equipment Recommendations  Rolling walker with 5" wheels    Recommendations for Other Services       Precautions / Restrictions Precautions Precautions: Fall Restrictions Weight Bearing Restrictions: No      Mobility  Bed Mobility Overal bed mobility: Needs Assistance Bed Mobility: Supine to Sit     Supine to sit: Min assist     General bed mobility comments: Pt  initiated a little movement, but ultimately needed assist to pull torso up and gets LEs to EOB  Transfers Overall transfer level: Needs assistance Equipment used: Rolling walker (2 wheeled) Transfers: Sit to/from Stand Sit to Stand: Mod assist         General transfer comment: Pt showed good effort, but on both attempts she struggled and needed far more assist than she (or her mother) seemed to expect/indicate  Ambulation/Gait Ambulation/Gait assistance: Min assist;Mod assist Gait Distance (Feet): 3 Feet Assistive device: 1 person hand held assist       General Gait Details: Pt unsteady with standing, apparently she does not use any AD normally but clearly needed PT HHA today to shuffle a few steps bed to recliner. Weak and limited   Stairs            Wheelchair Mobility    Modified Rankin (Stroke Patients Only)       Balance Overall balance assessment: Needs assistance Sitting-balance support: Feet supported Sitting balance-Leahy Scale: Good Sitting balance - Comments: Pt safe and confident sitting at EOB once assisted to position     Standing balance-Leahy Scale: Poor Standing balance comment: Pt not confident in standing at this point, poor confidence and safety with standing acts w/o AD                             Pertinent Vitals/Pain Pain Assessment: No/denies pain    Home Living Family/patient expects to be discharged to:: Private residence Living Arrangements: Parent Available Help at Discharge:  Family;Available 24 hours/day   Home Access: Stairs to enter Entrance Stairs-Rails: Right Entrance Stairs-Number of Steps: 3   Home Equipment: None      Prior Function Level of Independence: Independent         Comments: Pt has been weaker since starting dialysis last month, but generally is able to be up and active     Hand Dominance        Extremity/Trunk Assessment   Upper Extremity Assessment Upper Extremity Assessment:  Generalized weakness;Overall [facility] for tasks assessed    Lower Extremity Assessment Lower Extremity Assessment: Generalized weakness;Overall WFL for tasks assessed       Communication   Communication: No difficulties(minimally verbal, but appropriate.)  Cognition Arousal/Alertness: Awake/alert Behavior During Therapy: Anxious                                   General Comments: Pt at baseline, h/o Down's Syndrome, appropriate t/o session      General Comments General comments (skin integrity, edema, etc.): Pt c/o some dizziness on getting to sitting, BP was 70/50, it did increase 96/73 in sitting and she was asymptomatic after a few minutes.  After transfer to recliner she c/o dizziness again with BP of 77/60 in sitting.    Exercises     Assessment/Plan    PT Assessment Patient needs continued PT services  PT Problem List Decreased strength;Decreased activity tolerance;Decreased balance;Decreased coordination;Decreased mobility;Decreased cognition;Decreased knowledge of use of DME;Decreased safety awareness;Cardiopulmonary status limiting activity       PT Treatment Interventions DME instruction;Gait training;Stair training;Functional mobility training;Therapeutic activities;Therapeutic exercise;Balance training;Neuromuscular re-education;Cognitive remediation;Patient/family education    PT Goals (Current goals can be found in the Care Plan section)  Acute Rehab PT Goals Patient Stated Goal: get feeling stronger PT Goal Formulation: With patient Time For Goal Achievement: 12/05/18 Potential to Achieve Goals: Fair    Frequency Min 2X/week   Barriers to discharge        Co-evaluation               AM-PAC PT "6 Clicks" Mobility  Outcome Measure Help needed turning from your back to your side while in a flat bed without using bedrails?: A Little Help needed moving from lying on your back to sitting on the side of a flat bed without using bedrails?: A  Lot Help needed moving to and from a bed to a chair (including a wheelchair)?: A Lot Help needed standing up from a chair using your arms (e.g., wheelchair or bedside chair)?: A Lot Help needed to walk in hospital room?: A Lot Help needed climbing 3-5 steps with a railing? : Total 6 Click Score: 12    End of Session Equipment Utilized During Treatment: Gait belt;Oxygen(2L) Activity Tolerance: Patient limited by fatigue Patient left: with chair alarm set;with call bell/phone within reach;with family/visitor present Nurse Communication: Mobility status(BP and symptomatic dizziness (orthostatic?)) PT Visit Diagnosis: Muscle weakness (generalized) (M62.81);Unsteadiness on feet (R26.81);Difficulty in walking, not elsewhere classified (R26.2)    Time: 4081-4481 PT Time Calculation (min) (ACUTE ONLY): 28 min   Charges:   PT Evaluation $PT Eval Low Complexity: 1 Low PT Treatments $Therapeutic Activity: 8-22 mins        Kreg Shropshire, DPT 11/21/2018, 12:55 PM

## 2018-11-21 NOTE — Progress Notes (Signed)
[place] at [place] NAME: Michele Bartlett    MR#:  [id number]  DATE OF BIRTH:  03/02/1971  SUBJECTIVE:  CHIEF COMPLAINT: Patient is feeling ok , tolerated CPAP facial mask with anxiety medicine overnight reporting rested well  Still feeling dizzy when she stands up   mom and dad at bedside  REVIEW OF SYSTEMS:  CONSTITUTIONAL: No fever, fatigue or weakness.  EYES: No blurred or double vision.  EARS, NOSE, AND THROAT: No tinnitus or ear pain.  RESPIRATORY: No cough, shortness of breath, wheezing or hemoptysis.  CARDIOVASCULAR: No chest pain, orthopnea, edema.  GASTROINTESTINAL: No nausea, vomiting, diarrhea or abdominal pain.  GENITOURINARY: No dysuria, hematuria.  ENDOCRINE: No polyuria, nocturia,  HEMATOLOGY: No anemia, easy bruising or bleeding SKIN: No rash or lesion. MUSCULOSKELETAL: No joint pain or arthritis.   NEUROLOGIC: No tingling, numbness, weakness.  PSYCHIATRY: No anxiety or depression.   DRUG ALLERGIES:   Allergies  Allergen Reactions  . Novocain [Procaine] Rash    Skin peel raw all over    VITALS:  Blood pressure (!) 116/92, pulse (!) 106, temperature 98.4 F (36.9 C), temperature source Oral, resp. rate 12, height 5\' 1"  (1.549 m), weight 114.1 kg, last menstrual period 11/02/2018, SpO2 99 %.  PHYSICAL EXAMINATION:  GENERAL:  48 y.o.-year-old patient lying in the bed with no acute distress.  EYES: Pupils equal, round, reactive to light and accommodation. No scleral icterus. Extraocular muscles intact.  HEENT: Head atraumatic, normocephalic. Oropharynx and nasopharynx clear.  NECK:  Supple, no jugular venous distention. No thyroid enlargement, no tenderness.  LUNGS: Normal breath sounds bilaterally, no wheezing, rales,rhonchi or crepitation. No use of accessory muscles of respiration.  CARDIOVASCULAR: S1, S2 normal. No murmurs, rubs, or gallops.  ABDOMEN: Soft, nontender, nondistended. Bowel sounds present.   EXTREMITIES: No pedal edema, cyanosis, or clubbing.  NEUROLOGIC: Awake, alert and oriented x3. Sensation intact. Gait not checked.  PSYCHIATRIC: The patient is alert and oriented x 3.  SKIN: No obvious rash, lesion, or ulcer.    LABORATORY PANEL:   CBC Recent Labs  Lab 11/20/18 0545  WBC QUESTIONABLE IDENTIFICATION / INCORRECTLY LABELED SPECIMEN  HGB QUESTIONABLE IDENTIFICATION / INCORRECTLY LABELED SPECIMEN  HCT QUESTIONABLE IDENTIFICATION / INCORRECTLY LABELED SPECIMEN  PLT QUESTIONABLE IDENTIFICATION / INCORRECTLY LABELED SPECIMEN   ------------------------------------------------------------------------------------------------------------------  Chemistries  Recent Labs  Lab 11/18/2018 2012 11/18/18 0355 11/19/18 1828  NA 133* 135  --   K 3.9 4.1  --   CL 96* 100  --   CO2 23 28  --   GLUCOSE 189* 130*  --   BUN 20 22*  --   CREATININE 4.01* 4.43*  --   CALCIUM 8.6* 8.8*  --   MG  --   --  2.0  AST 140*  --   --   ALT 97*  --   --   ALKPHOS 54  --   --   BILITOT 2.2*  --   --    ------------------------------------------------------------------------------------------------------------------  Cardiac Enzymes No results for input(s): TROPONINI in the last 168 hours. ------------------------------------------------------------------------------------------------------------------  RADIOLOGY:  No results found.  EKG:   Orders placed or performed during the hospital encounter of 11/15/2018  . ED EKG  . ED EKG  . EKG 12-Lead  . EKG 12-Lead    ASSESSMENT AND PLAN:   #Acute hypoxic respiratory failure and hypercapnia -secondary to noncompliance with the CPAP off BiPAP Clinically improving Reinforced the importance of being compliant with CPAP.  Patient  can try nasal mask with chinstrap if she could not tolerate facemask Has appointment with [facility] pulmonology dr.fortin on February 25, which is rescheduled now Requiring 2 L of oxygen via nasal cannula  continuous at this time Okay to discharge patient from pul[facility]ology standpoint at [facility]  #Nonsustained V. Tach Spontaneously resolved Electrolytes are in normal range Cardiology consult placed to Dr. Clayborn Bigness has seen the patient, recommending to repeat echocardiogram Recommending to consider antiarrhythmic therapy with amiodarone if any further ventricular ectopy  #Orthostatic hypotension Patient is a dialysis patient will increase the dose of midodrine to 5 mg as patient is still feeling dizzy  #End-stage renal disease on hemodialysis-nephrology is following during the hospital course Patient had hemodialysis today Vascular consult for clotted AV fistula-seen by them for fistulogram in a.m. n.p.o. after midnight patient and family members are agreeable  #Chronic right-sided congestive heart failure No exacerbation at this time  #Essential hypertension not on any medications Blood pressure is okay becoming orthostatic  #Obstructive sleep apnea continue CPAP nightly  #Generalized weakness PT consult -recommending skilled nursing facility They will reassess the patient tomorrow  All the records are reviewed and case discussed with Care Management/Social Workerr. Management plans discussed with the patient, family and they are in agreement.  CODE STATUS: fc   TOTAL TIME TAKING CARE OF THIS PATIENT: 37  minutes.   POSSIBLE D/C IN 1-2  DAYS, DEPENDING ON CLINICAL CONDITION.  Note: This dictation was prepared with Dragon dictation along with smaller phrase technology. Any transcriptional errors that result from this process are unintentional.   Nicholes Mango M.D on 11/21/2018 at 2:23 PM  Between 7am to 6pm - Pager - [phone] After 6pm go to [email or web address] - password [email or web address] [facility]  [facility]  Office  [phone]  CC: Primary care physician; Lavera Guise, MD

## 2018-11-21 NOTE — Progress Notes (Signed)
Pt complains of constipation with no documented BM for 3 days. Prune juice given. MD notified for colace and possible senna orders.  I will continue to assess.

## 2018-11-21 NOTE — Care Management Important Message (Signed)
Copy of signed Medicare IM left with patient in room. 

## 2018-11-21 NOTE — Progress Notes (Signed)
[facility]  Daily Progress Note   11/15/18: Left brachial cephalic arteriovenous fistula placement  Subjective: Patient well known to our service. Multiple medical issues including ESRD with recent creation of a left brachial cephalic arteriovenous fistula placement on 11/15/18. Patient tolerated the procedure well and was transferred from the OR to recovery then home without issue. Seem as the patient decompensated upon discharge home. Family states she became progressively more lethargic even missing her Wed dialysis treatment. Patient did dialyze that Thursday however sought medical attention in our ED the following Friday. Upon arrival to the ED the patient was hypotensive and hypoxic. The patient doesn't use her BiPAP at home.   Upon physical exam the patient was found to have no bruit or thrill to her left upper extremity. Patient denies any left upper extremity pain or swelling. Denies any issues with the incision. Denies any fever, nausea or vomiting.  Vascular surgery was consulted by Dr. Margaretmary Eddy for clotted access  Objective: Vitals:   11/21/18 0504 11/21/18 0747 11/21/18 0942 11/21/18 0944  BP: 121/81 130/83 (!) 73/49 (!) 116/92  Pulse: 96 97 (!) 108 (!) 106  Resp: 12     Temp: (!) 97.5 F (36.4 C) 98.4 F (36.9 C)    TempSrc: Oral Oral    SpO2: 100% 99%    Weight: 114.1 kg     Height:        Intake/Output Summary (Last 24 hours) at 11/21/2018 1328 Last data filed at 11/20/2018 1345 Gross per 24 hour  Intake -  Output 0 ml  Net 0 ml   Physical Exam: A&Ox3, NAD CV: RRR Pulmonary: CTA Bilaterally Abdomen: Soft, Nontender, Nondistended Vascular:  Left Upper Extremity: Upper arm soft, forearm soft. Incision is clean, dry and intact. No bruit or thrill noted. No acute vascular compromise noted to the extremity. 2+ radial pulse. Motor / sensory intact.    Laboratory: CBC    Component Value Date/Time   WBC  11/20/2018 0545    QUESTIONABLE  IDENTIFICATION / INCORRECTLY LABELED SPECIMEN   HGB  11/20/2018 0545    QUESTIONABLE IDENTIFICATION / INCORRECTLY LABELED SPECIMEN   HGB 11.1 (L) 03/10/2012 2022   HCT  11/20/2018 0545    QUESTIONABLE IDENTIFICATION / INCORRECTLY LABELED SPECIMEN   HCT 34.6 (L) 03/10/2012 2022   PLT  11/20/2018 0545    QUESTIONABLE IDENTIFICATION / INCORRECTLY LABELED SPECIMEN   PLT 235 03/10/2012 2022   BMET    Component Value Date/Time   NA 135 11/18/2018 0355   NA 142 10/14/2017 1336   NA 139 03/10/2012 2022   K 4.1 11/18/2018 0355   K 4.3 03/10/2012 2022   CL 100 11/18/2018 0355   CL 103 03/10/2012 2022   CO2 28 11/18/2018 0355   CO2 28 03/10/2012 2022   GLUCOSE 130 (H) 11/18/2018 0355   GLUCOSE 99 03/10/2012 2022   BUN 22 (H) 11/18/2018 0355   BUN 16 10/14/2017 1336   BUN 25 (H) 03/10/2012 2022   CREATININE 4.43 (H) 11/18/2018 0355   CREATININE 1.84 (H) 03/10/2012 2022   CALCIUM 8.8 (L) 11/18/2018 0355   CALCIUM 9.0 03/10/2012 2022   GFRNONAA 11 (L) 11/18/2018 0355   GFRNONAA 34 (L) 03/10/2012 2022   GFRAA 13 (L) 11/18/2018 0355   GFRAA 39 (L) 03/10/2012 2022   Assessment/Planning: Patient well known to our service. Multiple medical issues including ESRD with recent creation of a left brachial cephalic arteriovenous fistula placement on 11/15/18. Re-admitted for respiratory failure. 1) Clotted  access: Unable to palpate thrill or auscultate bruit. Recommend a left upper extremity fistulogram with possible intervention with Dr. Delana Meyer on Wed. Procedure, risk and benefits explained to patient and her father who was at the bedside. Both agree to move forward with the procedure.  2) OSA: strongly encouraged patient and her father to use BiPAP to avoid respiratory decompensation in the future  Discussed with Dr. Eber Hong Bowie Doiron PA-C 11/21/2018 1:28 PM

## 2018-11-21 NOTE — Progress Notes (Signed)
MD notified of report from PT. RN rechecked BP. Pt still complains of being dizzy just a little. MD will increase midodrine to 5mg . I will continue to assess.

## 2018-11-21 NOTE — Progress Notes (Signed)
[place], [place] 11/21/18  Subjective:  Patient completed hemodialysis yesterday. Still feeling weak particularly when she stands. Due for dialysis again tomorrow.   Objective:  Vital signs in last 24 hours:  Temp:  [97.5 F (36.4 C)-98.4 F (36.9 C)] 98.4 F (36.9 C) (02/25 0747) Pulse Rate:  [96-108] 106 (02/25 0944) Resp:  [12-24] 12 (02/25 0504) BP: (73-130)/(49-92) 116/92 (02/25 0944) SpO2:  [96 %-100 %] 99 % (02/25 0747) Weight:  [114.1 kg] 114.1 kg (02/25 0504)  Weight change: -0.025 kg Filed Weights   11/20/18 0930 11/20/18 1429 11/21/18 0504  Weight: 114.1 kg 114.1 kg 114.1 kg    Intake/Output:   No intake or output data in the 24 hours ending 11/21/18 1451   Physical Exam: General:  Chronically ill-appearing, laying in the bed  HEENT  pale conjunctiva, moist oral mucous membranes  Neck  Short, thick  Pulm/lungs  normal breathing effort, CTAB  CVS/Heart  regular, no rub  Abdomen:   Soft, nontender  Extremities:  No peripheral edema  Neurologic:  Alert, able to follow simple commands  Skin:  No acute rashes  Access:  Right IJ PermCath, left arm new AV fistula       Basic Metabolic Panel:  Recent Labs  Lab 11/15/18 0646 11/14/2018 2012 11/18/18 0355 11/19/18 1828  NA 139 133* 135  --   K 4.0 3.9 4.1  --   CL  --  96* 100  --   CO2  --  23 28  --   GLUCOSE 98 189* 130*  --   BUN  --  20 22*  --   CREATININE  --  4.01* 4.43*  --   CALCIUM  --  8.6* 8.8*  --   MG  --   --   --  2.0     CBC: Recent Labs  Lab 11/15/18 0646 11/15/2018 2012 11/18/18 0355 11/20/18 0545  WBC  --  8.7 11.3* QUESTIONABLE IDENTIFICATION / INCORRECTLY LABELED SPECIMEN  NEUTROABS  --  6.5  --   --   HGB 13.9 13.3 13.0 QUESTIONABLE IDENTIFICATION / INCORRECTLY LABELED SPECIMEN  HCT 41.0 42.3 41.2 QUESTIONABLE IDENTIFICATION / INCORRECTLY LABELED SPECIMEN  MCV  --  91.0 90.9 QUESTIONABLE IDENTIFICATION / INCORRECTLY LABELED SPECIMEN  PLT   --  233 229 QUESTIONABLE IDENTIFICATION / INCORRECTLY LABELED SPECIMEN     No results found for: HEPBSAG, HEPBSAB, HEPBIGM    Microbiology:  Recent Results (from the past 240 hour(s))  Culture, blood (Routine X 2) w Reflex to ID Panel     Status: None (Preliminary result)   Collection Time: 11/23/2018  8:13 PM  Result Value Ref Range Status   Specimen Description BLOOD RIGHT FATTY CASTS  Final   Special Requests   Final    BOTTLES DRAWN AEROBIC AND ANAEROBIC Blood Culture adequate volume   Culture   Final    NO GROWTH 3 DAYS Performed at [facility], [address], [place], [place] [address]    Report Status PENDING  Incomplete  MRSA PCR Screening     Status: None   Collection Time: 11/18/18  3:36 AM  Result Value Ref Range Status   MRSA by PCR NEGATIVE NEGATIVE Final    Comment:        The GeneXpert MRSA Assay (FDA approved for NASAL specimens only), is one component of a comprehensive MRSA colonization surveillance program. It is not intended to diagnose MRSA infection nor to guide or monitor treatment for  MRSA infections. Performed at [facility], [address] [place], [place] [address]   Culture, blood (Routine X 2) w Reflex to ID Panel     Status: None (Preliminary result)   Collection Time: 11/18/18  3:55 AM  Result Value Ref Range Status   Specimen Description BLOOD RIGHT HAND  Final   Special Requests   Final    BOTTLES DRAWN AEROBIC AND ANAEROBIC Blood Culture adequate volume   Culture   Final    NO GROWTH 3 DAYS Performed at [facility], [address] [place], [place] [address]    Report Status PENDING  Incomplete    Coagulation Studies: No results for input(s): LABPROT, INR in the last 72 hours.  Urinalysis: No results for input(s): COLORURINE, LABSPEC, PHURINE, GLUCOSEU, HGBUR, BILIRUBINUR, KETONESUR, PROTEINUR, UROBILINOGEN, NITRITE, LEUKOCYTESUR in the last 72 hours.  Invalid input(s): APPERANCEUR     Imaging: No results found.   Medications:   . sodium chloride    . [START ON 11/01/2018]  ceFAZolin (ANCEF) IV     . Chlorhexidine Gluconate Cloth  6 each Topical Q0600  . heparin  5,000 Units Subcutaneous Q8H  . insulin aspart  0-5 Units Subcutaneous QHS  . insulin aspart  0-9 Units Subcutaneous TID WC  . midodrine  5 mg Oral TID WC  . mometasone-formoterol  2 puff Inhalation BID  . pantoprazole  20 mg Oral Daily   acetaminophen **OR** acetaminophen, ALPRAZolam, alum & mag hydroxide-simeth, calcium carbonate, diphenhydrAMINE, famotidine, HYDROmorphone (DILAUDID) injection, methylPREDNISolone (SOLU-MEDROL) injection, midazolam, [DISCONTINUED] ondansetron **OR** ondansetron (ZOFRAN) IV, ondansetron (ZOFRAN) IV  Assessment/ Plan:  48 y.o. African-American female with end-stage renal disease started hemodialysis January 2020, hypertension, proteinuria, Down syndrome, morbid obesity, obstructive sleep apnea, not using CPAP due to claustrophobia now admitted for hypercarbic acute respiratory failure and generalized weakness  ESRD/MWF/[name] [facility] dialysis/[facility]/111 kg/ 240 min  End-stage renal disease -no bruit heard in her recently placed left approach and a fistula.  We will ask that was surgically evaluated.  No acute indication for dialysis today.  We will plan for dialysis again tomorrow.  Acute respiratory failure, with hypoxia and hypercapnia -Treated with positive pressure ventilation -seems to be back to her baseline mental status.  Hypotension -still a bit dizzy at times.  Continue to monitor her blood pressure.  May need to consider midodrine as well.  Cardiac history 2D echo October 20, 2018: Normal left ventricular systolic function, mild LVH, severe tricuspid regurgitation and severe pulmonary hypertension, severely enlarged right atrium    LOS: 3 Michele Bartlett 2/25/20202:51 PM  [place], [place]  Note:  This note was prepared with Dragon dictation. Any transcription errors are unintentional

## 2018-11-22 ENCOUNTER — Encounter: Admission: EM | Disposition: E | Payer: Self-pay | Source: Home / Self Care | Attending: Internal Medicine

## 2018-11-22 HISTORY — PX: A/V SHUNT INTERVENTION: CATH118220

## 2018-11-22 LAB — GLUCOSE, CAPILLARY
GLUCOSE-CAPILLARY: 124 mg/dL — AB (ref 70–99)
Glucose-Capillary: 118 mg/dL — ABNORMAL HIGH (ref 70–99)
Glucose-Capillary: 114 mg/dL — ABNORMAL HIGH (ref 70–99)
Glucose-Capillary: 101 mg/dL — ABNORMAL HIGH (ref 70–99)
Glucose-Capillary: 128 mg/dL — ABNORMAL HIGH (ref 70–99)

## 2018-11-22 LAB — BASIC METABOLIC PANEL
Anion gap: 10 (ref 5–15)
BUN: 41 mg/dL — AB (ref 6–20)
CO2: 28 mmol/L (ref 22–32)
Calcium: 8.9 mg/dL (ref 8.9–10.3)
Chloride: 97 mmol/L — ABNORMAL LOW (ref 98–111)
Creatinine, Ser: 4.51 mg/dL — ABNORMAL HIGH (ref 0.44–1.00)
GFR calc Af Amer: 13 mL/min — ABNORMAL LOW (ref >60)
GFR calc non Af Amer: 11 mL/min — ABNORMAL LOW (ref >60)
Glucose, Bld: 93 mg/dL (ref 70–99)
Potassium: 3.8 mmol/L (ref 3.5–5.1)
SODIUM: 135 mmol/L (ref 135–145)

## 2018-11-22 LAB — MAGNESIUM: MAGNESIUM: 2.2 mg/dL (ref 1.7–2.4)

## 2018-11-22 SURGERY — A/V SHUNT INTERVENTION
Anesthesia: Moderate Sedation | Laterality: Left

## 2018-11-22 MED ORDER — FLEET ENEMA 7-19 GM/118ML RE ENEM: 1.0000 | ENEMA | Freq: Once | RECTAL | Status: DC | PRN

## 2018-11-22 MED ORDER — CEFAZOLIN SODIUM-DEXTROSE 1-4 GM/50ML-% IV SOLN
INTRAVENOUS | Status: AC
  Filled 2018-11-22: qty 50

## 2018-11-22 MED ORDER — NALOXONE HCL 2 MG/2ML IJ SOSY
PREFILLED_SYRINGE | INTRAMUSCULAR | Status: AC
  Administered 2018-11-22: 2 mg
  Filled 2018-11-22: qty 2

## 2018-11-22 MED ORDER — FLUMAZENIL 0.5 MG/5ML IV SOLN
INTRAVENOUS | Status: AC
  Filled 2018-11-22: qty 5

## 2018-11-22 MED ORDER — BUTALBITAL-APAP-CAFFEINE 50-325-40 MG PO TABS
1.0000 | ORAL_TABLET | Freq: Four times a day (QID) | ORAL | Status: DC | PRN
  Administered 2018-11-22 – 2018-11-23 (×3): 1 via ORAL
  Filled 2018-11-22 (×3): qty 1

## 2018-11-22 MED ORDER — FENTANYL CITRATE (PF) 100 MCG/2ML IJ SOLN
INTRAMUSCULAR | Status: AC
  Filled 2018-11-22: qty 2

## 2018-11-22 MED ORDER — FENTANYL CITRATE (PF) 100 MCG/2ML IJ SOLN
INTRAMUSCULAR | Status: DC | PRN
  Administered 2018-11-22: 50 ug via INTRAVENOUS

## 2018-11-22 MED ORDER — MIDAZOLAM HCL 2 MG/2ML IJ SOLN
INTRAMUSCULAR | Status: AC
  Filled 2018-11-22: qty 2

## 2018-11-22 MED ORDER — LIDOCAINE HCL (PF) 1 % IJ SOLN
INTRAMUSCULAR | Status: AC
  Filled 2018-11-22: qty 30

## 2018-11-22 MED ORDER — FLUMAZENIL 0.5 MG/5ML IV SOLN
INTRAVENOUS | Status: DC | PRN
  Administered 2018-11-22: 0.2 mg via INTRAVENOUS

## 2018-11-22 MED ORDER — HEPARIN SODIUM (PORCINE) 1000 UNIT/ML IJ SOLN
INTRAMUSCULAR | Status: AC
  Filled 2018-11-22: qty 1

## 2018-11-22 MED ORDER — SODIUM CHLORIDE FLUSH 0.9 % IV SOLN
INTRAVENOUS | Status: AC
  Filled 2018-11-22: qty 40

## 2018-11-22 MED ORDER — BISACODYL 10 MG RE SUPP
10.0000 mg | Freq: Every day | RECTAL | Status: DC | PRN
  Administered 2018-11-23: 10 mg via RECTAL
  Filled 2018-11-22: qty 1

## 2018-11-22 MED ORDER — MIDAZOLAM HCL 2 MG/2ML IJ SOLN
INTRAMUSCULAR | Status: DC | PRN
  Administered 2018-11-22: 2 mg via INTRAVENOUS

## 2018-11-22 SURGICAL SUPPLY — 8 items
NEEDLE ENTRY 21GA 7CM ECHOTIP (NEEDLE) IMPLANT
PACK ANGIOGRAPHY (CUSTOM PROCEDURE TRAY) IMPLANT
SET INTRO CAPELLA COAXIAL (SET/KITS/TRAYS/PACK) IMPLANT
SHEATH BRITE TIP 5FRX11 (SHEATH) IMPLANT
SHEATH BRITE TIP 6FRX5.5 (SHEATH) IMPLANT
SUT MNCRL 4-0 (SUTURE)
SUT MNCRL 4-0 27XMFL (SUTURE)
SUTURE MNCRL 4-0 27XMF (SUTURE) IMPLANT

## 2018-11-22 NOTE — Progress Notes (Signed)
[place], [place] 11/05/2018  Subjective:  Patient for fistulogram today. In addition patient also due for dialysis today.   Objective:  Vital signs in last 24 hours:  Temp:  [97.6 F (36.4 C)-98.1 F (36.7 C)] 97.6 F (36.4 C) (02/26 0828) Pulse Rate:  [74-113] 113 (02/26 0828) Resp:  [16-18] 18 (02/26 1791) BP: (117-124)/(62-83) 118/62 (02/26 0828) SpO2:  [94 %-100 %] 95 % (02/26 0828) Weight:  [115.6 kg] 115.6 kg (02/26 0613)  Weight change: 1.5 kg Filed Weights   11/20/18 1429 11/21/18 0504 11/19/2018 5056  Weight: 114.1 kg 114.1 kg 115.6 kg    Intake/Output:   No intake or output data in the 24 hours ending 11/10/2018 1155   Physical Exam: General:  Chronically ill-appearing, laying in the bed  HEENT  pale conjunctiva, moist oral mucous membranes  Neck  Short, thick  Pulm/lungs  normal breathing effort, CTAB  CVS/Heart  regular, no rub  Abdomen:   Soft, nontender  Extremities:  No peripheral edema  Neurologic:  Alert, able to follow simple commands  Skin:  No acute rashes  Access:  Right IJ PermCath, left arm new AV fistula       Basic Metabolic Panel:  Recent Labs  Lab 11/14/2018 2012 11/18/18 0355 11/19/18 1828 11/10/2018 0531  NA 133* 135  --  135  K 3.9 4.1  --  3.8  CL 96* 100  --  97*  CO2 23 28  --  28  GLUCOSE 189* 130*  --  93  BUN 20 22*  --  41*  CREATININE 4.01* 4.43*  --  4.51*  CALCIUM 8.6* 8.8*  --  8.9  MG  --   --  2.0 2.2     CBC: Recent Labs  Lab 11/13/2018 2012 11/18/18 0355 11/20/18 0545  WBC 8.7 11.3* QUESTIONABLE IDENTIFICATION / INCORRECTLY LABELED SPECIMEN  NEUTROABS 6.5  --   --   HGB 13.3 13.0 QUESTIONABLE IDENTIFICATION / INCORRECTLY LABELED SPECIMEN  HCT 42.3 41.2 QUESTIONABLE IDENTIFICATION / INCORRECTLY LABELED SPECIMEN  MCV 91.0 90.9 QUESTIONABLE IDENTIFICATION / INCORRECTLY LABELED SPECIMEN  PLT 233 229 QUESTIONABLE IDENTIFICATION / INCORRECTLY LABELED SPECIMEN     No results found  for: HEPBSAG, HEPBSAB, HEPBIGM    Microbiology:  Recent Results (from the past 240 hour(s))  Culture, blood (Routine X 2) w Reflex to ID Panel     Status: None (Preliminary result)   Collection Time: 11/04/2018  8:13 PM  Result Value Ref Range Status   Specimen Description BLOOD RIGHT FATTY CASTS  Final   Special Requests   Final    BOTTLES DRAWN AEROBIC AND ANAEROBIC Blood Culture adequate volume   Culture   Final    NO GROWTH 4 DAYS Performed at [facility], [address] [place], [place] [address]    Report Status PENDING  Incomplete  MRSA PCR Screening     Status: None   Collection Time: 11/18/18  3:36 AM  Result Value Ref Range Status   MRSA by PCR NEGATIVE NEGATIVE Final    Comment:        The GeneXpert MRSA Assay (FDA approved for NASAL specimens only), is one component of a comprehensive MRSA colonization surveillance program. It is not intended to diagnose MRSA infection nor to guide or monitor treatment for MRSA infections. Performed at [facility], [address] [place], [place] [address]   Culture, blood (Routine X 2) w Reflex to ID Panel     Status: None (  Preliminary result)   Collection Time: 11/18/18  3:55 AM  Result Value Ref Range Status   Specimen Description BLOOD RIGHT HAND  Final   Special Requests   Final    BOTTLES DRAWN AEROBIC AND ANAEROBIC Blood Culture adequate volume   Culture   Final    NO GROWTH 4 DAYS Performed at [facility], [address] [place], [place] [address]    Report Status PENDING  Incomplete    Coagulation Studies: No results for input(s): LABPROT, INR in the last 72 hours.  Urinalysis: No results for input(s): COLORURINE, LABSPEC, PHURINE, GLUCOSEU, HGBUR, BILIRUBINUR, KETONESUR, PROTEINUR, UROBILINOGEN, NITRITE, LEUKOCYTESUR in the last 72 hours.  Invalid input(s): APPERANCEUR    Imaging: No results found.   Medications:   . sodium chloride    .  ceFAZolin (ANCEF) IV      . amiodarone  200 mg Oral BID  . Chlorhexidine Gluconate Cloth  6 each Topical Q0600  . docusate sodium  100 mg Oral BID  . heparin  5,000 Units Subcutaneous Q8H  . insulin aspart  0-5 Units Subcutaneous QHS  . insulin aspart  0-9 Units Subcutaneous TID WC  . midodrine  5 mg Oral TID WC  . mometasone-formoterol  2 puff Inhalation BID  . pantoprazole  20 mg Oral Daily   acetaminophen **OR** acetaminophen, ALPRAZolam, alum & mag hydroxide-simeth, bisacodyl, butalbital-acetaminophen-caffeine, calcium carbonate, diphenhydrAMINE, famotidine, HYDROmorphone (DILAUDID) injection, methylPREDNISolone (SOLU-MEDROL) injection, midazolam, [DISCONTINUED] ondansetron **OR** ondansetron (ZOFRAN) IV, senna, sodium phosphate  Assessment/ Plan:  48 y.o. African-American female with end-stage renal disease started hemodialysis January 2020, hypertension, proteinuria, Down syndrome, morbid obesity, obstructive sleep apnea, not using CPAP due to claustrophobia now admitted for hypercarbic acute respiratory failure and generalized weakness  ESRD/MWF/[name] [facility] dialysis/[facility]/111 kg/ 240 min  End-stage renal disease -Patient due for dialysis today using her PermCath.  She will also have fistulogram to evaluate her left upper extremity fistula that recently had no bruit.  Acute respiratory failure, with hypoxia and hypercapnia -Improved as compared to admission.  Follow respiratory status closely after her fistulogram today.  Hypotension -Continue midodrine 5 mg p.o. 3 times daily.  Cardiac history 2D echo October 20, 2018: Normal left ventricular systolic function, mild LVH, severe tricuspid regurgitation and severe pulmonary hypertension, severely enlarged right atrium    LOS: 4 Perl Kerney 2/26/202011:55 AM  [place], [place]  Note: This note was prepared with Dragon dictation. Any transcription errors are unintentional

## 2018-11-22 NOTE — Progress Notes (Signed)
HD Tx started w/o complication   53/00/51 1021  Vital Signs  Temp 98 F (36.7 C)  Temp Source Oral  Pulse Rate 97  Pulse Rate Source Monitor  Resp (!) 24  BP 112/80  BP Location Left Leg  BP Method Automatic  Patient Position (if appropriate) Lying  Oxygen Therapy  SpO2 100 %  O2 Device Nasal Cannula  O2 Flow Rate (L/min) 3 L/min  Pain Assessment  Pain Scale 0-10  Pain Score 0  Dialysis Weight  Weight 115.8 kg  Type of Weight Pre-Dialysis  Time-Out for Hemodialysis  What Procedure? HD  Pt Identifiers(min of two) First/Last Name;MRN/Account#  Correct Site? Yes  Correct Side? Yes  Correct Procedure? Yes  Consents Verified? Yes  Rad Studies Available? N/A  Safety Precautions Reviewed? Yes  Engineer, civil (consulting) Number 1  Station Number 1  UF/Alarm Test Passed  Conductivity: Meter 14  Conductivity: Machine  14  pH 7.4  Reverse Osmosis Main  Normal Saline Lot Number [id number]  Dialyzer Lot Number [id number]  Disposable Set Lot Number [id number]  Machine Temperature 98.6 F (37 C)  Musician and Audible Yes  Blood Lines Intact and Secured Yes  Pre Treatment Patient Checks  Vascular access used during treatment Catheter  Hepatitis B Surface Antigen Results Negative  Date Hepatitis B Surface Antigen Drawn 11/06/18  Hepatitis B Surface Antibody  (<10)  Date Hepatitis B Surface Antibody Drawn 11/06/18  Hemodialysis Consent Verified Yes  Hemodialysis Standing Orders Initiated Yes  ECG (Telemetry) Monitor On Yes  Prime Ordered Normal Saline  Length of  DialysisTreatment -hour(s) 3.5 Hour(s)  Dialysis Treatment Comments Na 140  Dialyzer Elisio 17H NR  Dialysate 3K, 2.5 Ca  Dialysis Anticoagulant None  Dialysate Flow Ordered 800  Blood Flow Rate Ordered 400 mL/min  Ultrafiltration Goal 1.5 Liters  Dialysis Blood Pressure Support Ordered Normal Saline  During Hemodialysis Assessment  Blood Flow Rate (mL/min) 400 mL/min  Arterial Pressure (mmHg) -180 mmHg   Venous Pressure (mmHg) 150 mmHg  Transmembrane Pressure (mmHg) 60 mmHg  Ultrafiltration Rate (mL/min) 500 mL/min  Dialysate Flow Rate (mL/min) 800 ml/min  Conductivity: Machine  14.1  HD Safety Checks Performed Yes  Dialysis Fluid Bolus Normal Saline  Bolus Amount (mL) 250 mL  Intra-Hemodialysis Comments Tx initiated  Education / Care Plan  Dialysis Education Provided Yes  Documented Education in Care Plan Yes  Hemodialysis Catheter Right Subclavian Double-lumen  No Placement Date or Time found.   Placed prior to admission: Yes  Orientation: Right  Access Location: Subclavian  Hemodialysis Catheter Type: Double-lumen  Site Condition No complications  Blue Lumen Status Flushed  Red Lumen Status Flushed  Dressing Type Biopatch  Dressing Status Clean;Dry;Intact

## 2018-11-22 NOTE — Progress Notes (Signed)
[place] [place] [facility] [place] NAME: Michele Bartlett    MR#:  [id number]  DATE OF BIRTH:  1971-05-20  SUBJECTIVE:  CHIEF COMPLAINT: Patient is reporting headache Tylenol is not helping.  No bowel movement in the past several days and reporting abdominal discomfort feeling ok , tolerated CPAP facial mask with anxiety medicine overnight reporting rested well   Mom at bedside  REVIEW OF SYSTEMS:  CONSTITUTIONAL: No fever, fatigue or weakness.  EYES: No blurred or double vision.  EARS, NOSE, AND THROAT: No tinnitus or ear pain.  RESPIRATORY: No cough, shortness of breath, wheezing or hemoptysis.  CARDIOVASCULAR: No chest pain, orthopnea, edema.  GASTROINTESTINAL: No nausea, vomiting, diarrhea or abdominal pain.  GENITOURINARY: No dysuria, hematuria.  ENDOCRINE: No polyuria, nocturia,  HEMATOLOGY: No anemia, easy bruising or bleeding SKIN: No rash or lesion. MUSCULOSKELETAL: No joint pain or arthritis.   NEUROLOGIC: No tingling, numbness, weakness.  PSYCHIATRY: No anxiety or depression.   DRUG ALLERGIES:   Allergies  Allergen Reactions  . Novocain [Procaine] Rash    Skin peel raw all over    VITALS:  Blood pressure 118/62, pulse (!) 113, temperature 97.6 F (36.4 C), temperature source Oral, resp. rate 18, height 5\' 1"  (1.549 m), weight 115.6 kg, last menstrual period 11/02/2018, SpO2 95 %.  PHYSICAL EXAMINATION:  GENERAL:  48 y.o.-year-old patient lying in the bed with no acute distress.  EYES: Pupils equal, round, reactive to light and accommodation. No scleral icterus. Extraocular muscles intact.  HEENT: Head atraumatic, normocephalic. Oropharynx and nasopharynx clear.  NECK:  Supple, no jugular venous distention. No thyroid enlargement, no tenderness.  LUNGS: Normal breath sounds bilaterally, no wheezing, rales,rhonchi or crepitation. No use of accessory muscles of respiration.  CARDIOVASCULAR: S1, S2 normal. No murmurs, rubs, or  gallops.  ABDOMEN: Soft, nontender, nondistended. Bowel sounds present.  EXTREMITIES: No pedal edema, cyanosis, or clubbing.  NEUROLOGIC: Awake, alert and oriented x3. Sensation intact. Gait not checked.  PSYCHIATRIC: The patient is alert and oriented x 3.  SKIN: No obvious rash, lesion, or ulcer.    LABORATORY PANEL:   CBC Recent Labs  Lab 11/20/18 0545  WBC QUESTIONABLE IDENTIFICATION / INCORRECTLY LABELED SPECIMEN  HGB QUESTIONABLE IDENTIFICATION / INCORRECTLY LABELED SPECIMEN  HCT QUESTIONABLE IDENTIFICATION / INCORRECTLY LABELED SPECIMEN  PLT QUESTIONABLE IDENTIFICATION / INCORRECTLY LABELED SPECIMEN   ------------------------------------------------------------------------------------------------------------------  Chemistries  Recent Labs  Lab 10/28/2018 2012  11/10/2018 0531  NA 133*   < > 135  K 3.9   < > 3.8  CL 96*   < > 97*  CO2 23   < > 28  GLUCOSE 189*   < > 93  BUN 20   < > 41*  CREATININE 4.01*   < > 4.51*  CALCIUM 8.6*   < > 8.9  MG  --    < > 2.2  AST 140*  --   --   ALT 97*  --   --   ALKPHOS 54  --   --   BILITOT 2.2*  --   --    < > = values in this interval not displayed.   ------------------------------------------------------------------------------------------------------------------  Cardiac Enzymes No results for input(s): TROPONINI in the last 168 hours. ------------------------------------------------------------------------------------------------------------------  RADIOLOGY:  No results found.  EKG:   Orders placed or performed during the hospital encounter of 11/14/2018  . ED EKG  . ED EKG  . EKG 12-Lead  . EKG 12-Lead    ASSESSMENT AND PLAN:   #  Acute hypoxic respiratory failure and hypercapnia -secondary to noncompliance with the CPAP off BiPAP Clinically improving Reinforced the importance of being compliant with CPAP.  Patient is tolerating CPAP with facial mask .patient can try nasal mask with chinstrap if she could not  tolerate facemask Has appointment with [place] pulmonology dr.fortin on February 25, which is rescheduled now Requiring 2 L of oxygen via nasal cannula continuous at this time Okay to discharge patient from pulmonology standpoint at [facility]  #Nonsustained V. Tach-recurrent Spontaneously resolving Electrolytes are in normal range Cardiology consult placed to Dr. Clayborn Bigness has seen the patient, recommending to repeat echocardiogram Amiodarone started on 11/21/2018 as patient had another episode of nonsustained V. Tach  #Orthostatic hypotension Patient is a dialysis patient   midodrine dose increased to 5 mg 3 times daily  #End-stage renal disease on hemodialysis-nephrology is following during the hospital course Patient had hemodialysis today Vascular consult for clotted AV fistula-seen by them for fistulogram today . n.p.o.   #Chronic right-sided congestive heart failure No exacerbation at this time  #Essential hypertension not on any medications Blood pressure is okay becoming orthostatic  #Obstructive sleep apnea continue CPAP nightly  #Constipation -laxatives and stool softeners  #Generalized weakness PT consult -recommending skilled nursing facility They will reassess the patient tomorrow  All the records are reviewed and case discussed with Care Management/Social Workerr. Management plans discussed with the patient, family and they are in agreement.  CODE STATUS: fc   TOTAL TIME TAKING CARE OF THIS PATIENT: 37  minutes.   POSSIBLE D/C IN 1-2  DAYS, DEPENDING ON CLINICAL CONDITION.  Note: This dictation was prepared with Dragon dictation along with smaller phrase technology. Any transcriptional errors that result from this process are unintentional.   Nicholes Mango M.D on 10/29/2018 at 3:47 PM  Between 7am to 6pm - Pager - [phone] After 6pm go to [email or web address] - password [email or web address] [facility]  [facility]  Office  [phone]  CC: Primary care physician; Lavera Guise, MD

## 2018-11-22 NOTE — Progress Notes (Signed)
Pre HD Assessment    11/03/2018 1125  Neurological  Level of Consciousness Alert  Orientation Level Oriented X4  Respiratory  Respiratory Pattern Regular;Unlabored  Chest Assessment Chest expansion symmetrical  Bilateral Breath Sounds Diminished  Cough None  Cardiac  Pulse Regular  Heart Sounds S1, S2  ECG Monitor Yes  Vascular  R Radial Pulse +2  L Radial Pulse +2  Edema Generalized  Generalized Edema +1  Psychosocial  Psychosocial (WDL) WDL

## 2018-11-22 NOTE — Progress Notes (Signed)
Post HD Assessment    11/14/2018 1245  Neurological  Level of Consciousness Alert  Orientation Level Oriented X4  Respiratory  Respiratory Pattern Regular;Unlabored  Chest Assessment Chest expansion symmetrical  Bilateral Breath Sounds Diminished  Cough None  Cardiac  Pulse Regular  Heart Sounds S1, S2  ECG Monitor Yes  Vascular  R Radial Pulse +2  L Radial Pulse +2  Edema Generalized  Generalized Edema +1  Psychosocial  Psychosocial (WDL) WDL

## 2018-11-22 NOTE — Progress Notes (Signed)
Post HD , TX incomplete, will be rescheduled post access intervention, CVC not working properly. MD aware, gave verbal order to D/C Tx.    11/17/2018 1245  Hand-Off documentation  Report given to (Full Name) Arlyss Gandy, RN   Report received from (Full Name) Beatris Ship, RN   Vital Signs  Pulse Rate 93  Resp (!) 28  BP (!) 131/92  Oxygen Therapy  SpO2 98 %  Post-Hemodialysis Assessment  Rinseback Volume (mL) 250 mL  KECN 6.8 V  Dialyzer Clearance Clotted  Duration of HD Treatment -hour(s) 1 hour(s)  Hemodialysis Intake (mL) 500 mL  UF Total -Machine (mL) 188 mL  Net UF (mL) -312 mL  Tolerated HD Treatment Yes  Post-Hemodialysis Comments  (Pt Tx wil be rescheduled post access intervention, per MD )  Hemodialysis Catheter Right Subclavian Double-lumen  No Placement Date or Time found.   Placed prior to admission: Yes  Orientation: Right  Access Location: Subclavian  Hemodialysis Catheter Type: Double-lumen  Site Condition No complications  Blue Lumen Status Heparin locked  Red Lumen Status Heparin locked  Post treatment catheter status Capped and Clamped

## 2018-11-22 NOTE — Progress Notes (Signed)
Second time attempting to put patient on bipap and overnight. Now needing something for gas. rn has been notified.

## 2018-11-22 NOTE — Progress Notes (Signed)
HD TX ended    11/21/2018 1230  Vital Signs  Pulse Rate (!) 39  Resp 19  BP (!) 127/100  Oxygen Therapy  SpO2 (!) 87 %  During Hemodialysis Assessment  HD Safety Checks Performed Yes  KECN 6.8 KECN  Dialysis Fluid Bolus Normal Saline  Bolus Amount (mL) 250 mL  Intra-Hemodialysis Comments Tx completed (MD ordered stop TX, catheter malfunctioning, clotted system )

## 2018-11-22 NOTE — Op Note (Signed)
The patient was brought to special procedures and given 2 mg of Versed +15 mcg of fentanyl.  She subsequently became apneic.Marland Kitchen  She responded to initiation of high flow oxygen with CPAP as well as 1 dose of Narcan and 1 dose of Romazicon.  However, given this situation I did not feel that it was safe to proceed with intervention.  Given her current overall medical status she appears quite labile and I do not see how we can proceed with revising and/or creating upper extremity access at this time.  Hopefully with aggressive treatment of her cardiopulmonary status as well as routine use of her CPAP at home which has not been happening we can advance her to a point that it will be safe to move forward.  In the meantime we will continue catheter-based dialysis.

## 2018-11-23 ENCOUNTER — Other Ambulatory Visit (INDEPENDENT_AMBULATORY_CARE_PROVIDER_SITE_OTHER): Payer: Self-pay | Admitting: Vascular Surgery

## 2018-11-23 ENCOUNTER — Inpatient Hospital Stay: Payer: Medicare Other

## 2018-11-23 LAB — PATHOLOGIST SMEAR REVIEW

## 2018-11-23 LAB — CBC
HCT: 33.7 % — ABNORMAL LOW (ref 36.0–46.0)
Hemoglobin: 10.9 g/dL — ABNORMAL LOW (ref 12.0–15.0)
MCH: 28.9 pg (ref 26.0–34.0)
MCHC: 32.3 g/dL (ref 30.0–36.0)
MCV: 89.4 fL (ref 80.0–100.0)
NRBC: 10 % — AB (ref 0.0–0.2)
Platelets: 153 10*3/uL (ref 150–400)
RBC: 3.77 MIL/uL — ABNORMAL LOW (ref 3.87–5.11)
RDW: 14.6 % (ref 11.5–15.5)
WBC: 12.2 10*3/uL — AB (ref 4.0–10.5)

## 2018-11-23 LAB — CULTURE, BLOOD (ROUTINE X 2)
CULTURE: NO GROWTH
Culture: NO GROWTH
Special Requests: ADEQUATE
Special Requests: ADEQUATE

## 2018-11-23 LAB — GLUCOSE, CAPILLARY
GLUCOSE-CAPILLARY: 91 mg/dL (ref 70–99)
Glucose-Capillary: 112 mg/dL — ABNORMAL HIGH (ref 70–99)
Glucose-Capillary: 148 mg/dL — ABNORMAL HIGH (ref 70–99)
Glucose-Capillary: 138 mg/dL — ABNORMAL HIGH (ref 70–99)

## 2018-11-23 LAB — PHOSPHORUS: Phosphorus: 5.2 mg/dL — ABNORMAL HIGH (ref 2.5–4.6)

## 2018-11-23 MED ORDER — FLEET ENEMA 7-19 GM/118ML RE ENEM: 1.0000 | ENEMA | Freq: Once | RECTAL | Status: DC

## 2018-11-23 MED ORDER — SODIUM CHLORIDE 0.9 % IV SOLN
5.0000 mg | Freq: Once | INTRAVENOUS | Status: AC
  Administered 2018-11-23: 5 mg via INTRAVENOUS
  Filled 2018-11-23: qty 5

## 2018-11-23 MED ORDER — SIMETHICONE 80 MG PO CHEW
80.0000 mg | CHEWABLE_TABLET | Freq: Four times a day (QID) | ORAL | Status: DC | PRN
  Administered 2018-11-23 – 2018-11-29 (×3): 80 mg via ORAL
  Filled 2018-11-23 (×4): qty 1

## 2018-11-23 MED ORDER — SODIUM CHLORIDE 0.9 % IV SOLN
5.0000 mg | Freq: Once | INTRAVENOUS | Status: DC
  Filled 2018-11-23: qty 5

## 2018-11-23 MED ORDER — RENA-VITE PO TABS
1.0000 | ORAL_TABLET | Freq: Every day | ORAL | Status: DC
  Administered 2018-11-23 – 2018-11-28 (×6): 1 via ORAL
  Filled 2018-11-23 (×7): qty 1

## 2018-11-23 MED ORDER — HEPARIN SODIUM (PORCINE) 5000 UNIT/ML IJ SOLN
INTRAMUSCULAR | Status: AC
  Filled 2018-11-23: qty 2

## 2018-11-23 MED ORDER — SODIUM CHLORIDE 0.9 % IV SOLN: 5.0000 mg | Freq: Once | INTRAVENOUS | Status: AC

## 2018-11-23 MED ORDER — LORATADINE 10 MG PO TABS
10.0000 mg | ORAL_TABLET | Freq: Every day | ORAL | Status: DC | PRN
  Administered 2018-11-23 – 2018-11-26 (×2): 10 mg via ORAL
  Filled 2018-11-23 (×2): qty 1

## 2018-11-23 MED ORDER — NEPRO/CARBSTEADY PO LIQD
237.0000 mL | Freq: Two times a day (BID) | ORAL | Status: DC
  Administered 2018-11-23 – 2018-11-26 (×2): 237 mL via ORAL

## 2018-11-23 MED ORDER — FLUTICASONE PROPIONATE 50 MCG/ACT NA SUSP
1.0000 | Freq: Every day | NASAL | Status: DC
  Administered 2018-11-25 – 2018-11-29 (×5): 1 via NASAL
  Filled 2018-11-23: qty 16

## 2018-11-23 NOTE — Progress Notes (Signed)
Bilat. permcath ports flushed with NS and Heparin by K. Stegmayer,PA-C. Pt. Tolerated well. Pt. Now tx to dialysis via bed, with RN escort. Stable for tx. Pt. Parents aware of tx and will wait for pt. In her room.

## 2018-11-23 NOTE — Progress Notes (Signed)
TPA infusions completed x 2. Dr. Lucky Cowboy paged to assess perm cath. Await arrival.

## 2018-11-23 NOTE — Progress Notes (Signed)
[place] [facility] [place] NAME: Michele Bartlett    MR#:  [id number]  DATE OF BIRTH:  1970/11/13  SUBJECTIVE:  CHIEF COMPLAINT: Patients headache is better.  Fistulogram 11/19/2018 was canceled as patient became apneic.  Still constipated with no bowel movement.  Patient is scheduled for declotting of her permacath today Mom at bedside  REVIEW OF SYSTEMS:  CONSTITUTIONAL: No fever, fatigue or weakness.  EYES: No blurred or double vision.  EARS, NOSE, AND THROAT: No tinnitus or ear pain.  RESPIRATORY: No cough, shortness of breath, wheezing or hemoptysis.  CARDIOVASCULAR: No chest pain, orthopnea, edema.  GASTROINTESTINAL: No nausea, vomiting, diarrhea or abdominal pain.  GENITOURINARY: No dysuria, hematuria.  ENDOCRINE: No polyuria, nocturia,  HEMATOLOGY: No anemia, easy bruising or bleeding SKIN: No rash or lesion. MUSCULOSKELETAL: No joint pain or arthritis.   NEUROLOGIC: No tingling, numbness, weakness.  PSYCHIATRY: No anxiety or depression.   DRUG ALLERGIES:   Allergies  Allergen Reactions  . Novocain [Procaine] Rash    Skin peel raw all over    VITALS:  Blood pressure 125/81, pulse 86, temperature (!) 96.7 F (35.9 C), temperature source Axillary, resp. rate 18, height 5\' 1"  (1.549 m), weight 115.6 kg, last menstrual period 11/02/2018, SpO2 92 %.  PHYSICAL EXAMINATION:  GENERAL:  48 y.o.-year-old patient lying in the bed with no acute distress.  EYES: Pupils equal, round, reactive to light and accommodation. No scleral icterus. Extraocular muscles intact.  HEENT: Head atraumatic, normocephalic. Oropharynx and nasopharynx clear.  NECK:  Supple, no jugular venous distention. No thyroid enlargement, no tenderness.  LUNGS: Normal breath sounds bilaterally, no wheezing, rales,rhonchi or crepitation. No use of accessory muscles of respiration.  CARDIOVASCULAR: S1, S2 normal. No murmurs, rubs, or gallops.  ABDOMEN: Soft, nontender,  nondistended. Bowel sounds present.  EXTREMITIES: No pedal edema, cyanosis, or clubbing.  NEUROLOGIC: Awake, alert and oriented x3. Sensation intact. Gait not checked.  PSYCHIATRIC: The patient is alert and oriented x 3.  SKIN: No obvious rash, lesion, or ulcer.    LABORATORY PANEL:   CBC Recent Labs  Lab 11/23/18 0503  WBC 12.2*  HGB 10.9*  HCT 33.7*  PLT 153   ------------------------------------------------------------------------------------------------------------------  Chemistries  Recent Labs  Lab 10/31/2018 2012  10/30/2018 0531  NA 133*   < > 135  K 3.9   < > 3.8  CL 96*   < > 97*  CO2 23   < > 28  GLUCOSE 189*   < > 93  BUN 20   < > 41*  CREATININE 4.01*   < > 4.51*  CALCIUM 8.6*   < > 8.9  MG  --    < > 2.2  AST 140*  --   --   ALT 97*  --   --   ALKPHOS 54  --   --   BILITOT 2.2*  --   --    < > = values in this interval not displayed.   ------------------------------------------------------------------------------------------------------------------  Cardiac Enzymes No results for input(s): TROPONINI in the last 168 hours. ------------------------------------------------------------------------------------------------------------------  RADIOLOGY:  No results found.  EKG:   Orders placed or performed during the hospital encounter of 11/16/2018  . ED EKG  . ED EKG  . EKG 12-Lead  . EKG 12-Lead    ASSESSMENT AND PLAN:   #Acute hypoxic respiratory failure and hypercapnia -secondary to noncompliance with the CPAP off BiPAP Clinically improving Reinforced the importance of being compliant with CPAP.  Patient  is tolerating CPAP with facial mask .patient can try nasal mask with chinstrap if she could not tolerate facemask F/u  with [facility] pulmonology dr.fortin on February 25, which is rescheduled now Requiring 2 L of oxygen via nasal cannula continuous at this time Okay to discharge patient from pulmonology standpoint at [facility]  #Nonsustained V.  Tach-recurrent Spontaneously resolving Electrolytes are in normal range Cardiology consult placed to Dr. Clayborn Bigness has seen the patient, recommending to repeat echocardiogram Amiodarone started on 11/21/2018 as patient had another episode of nonsustained V. Tach  #Orthostatic hypotension Patient is a dialysis patient   midodrine dose increased to 5 mg 3 times daily.  Dizziness resolved  #End-stage renal disease on hemodialysis-nephrology is following during the hospital course Patient will get hemodialysis after declotting of the permacath by Dr. Virl Diamond was canceled as patient became apneic after anesthesia  #Chronic right-sided congestive heart failure No exacerbation at this time  #Essential hypertension not on any medications Blood pressure is okay becoming orthostatic  #Obstructive sleep apnea continue CPAP nightly  #Constipation -laxatives and stool softeners.  Patient will get enema today after procedure  #Generalized weakness PT consult -recommending skilled nursing facility They will reassess the patient tomorrow  All the records are reviewed and case discussed with Care Management/Social Workerr. Management plans discussed with the patient, family and they are in agreement.  They are agreeable to go to SNF  CODE STATUS: fc   TOTAL TIME TAKING CARE OF THIS PATIENT: 37  minutes.   POSSIBLE D/C IN 1-2  DAYS, DEPENDING ON CLINICAL CONDITION.  Note: This dictation was prepared with Dragon dictation along with smaller phrase technology. Any transcriptional errors that result from this process are unintentional.   Nicholes Mango M.D on 11/23/2018 at 2:57 PM  Between 7am to 6pm - Pager - [phone] After 6pm go to [email or web address] - password [email or web address] [facility]  [facility]  Office  [phone]  CC: Primary care physician; Lavera Guise, MD

## 2018-11-23 NOTE — Progress Notes (Signed)
PT Cancellation Note  Patient Details Name: Michele Bartlett MRN: [id number] DOB: April 14, 1971   Cancelled Treatment:    Reason Eval/Treat Not Completed: Patient at procedure or test/unavailable. Pt with nursing for procedures then to dialysis. Re attempt at a later date, as the schedule and pt availability allows.    Larae Grooms, PTA 11/23/2018, 2:34 PM

## 2018-11-23 NOTE — Progress Notes (Signed)
Pre HD Tx Assessment   11/23/18 1330  Neurological  Level of Consciousness Alert  Orientation Level Oriented X4  Respiratory  Respiratory Pattern Regular;Shallow  Bilateral Breath Sounds Diminished  Cardiac  Pulse Regular  Heart Sounds S1, S2  ECG Monitor Yes  Cardiac Rhythm NSR  Vascular  R Radial Pulse +2  L Radial Pulse +2  Integumentary  Integumentary (WDL) X  Skin Color Appropriate for ethnicity  Skin Condition Dry  Skin Integrity Surgical Incision (see LDA)  Musculoskeletal  Musculoskeletal (WDL) X  Generalized Weakness Yes  Gastrointestinal  Bowel Sounds Assessment Active  Psychosocial  Psychosocial (WDL) WDL

## 2018-11-23 NOTE — Progress Notes (Signed)
Post HD Tx   11/23/18 1550  Hand-Off documentation  Report given to (Full Name) Caryl Pina RN 2A   Report received from (Full Name) Trellis Paganini RN  Vital Signs  Temp 98.3 F (36.8 C)  Temp Source Oral  Pulse Rate 81  Pulse Rate Source Monitor  Resp 18  BP (!) 147/94  BP Location Right Leg  BP Method Automatic  Patient Position (if appropriate) Lying  Oxygen Therapy  SpO2 98 %  O2 Device Nasal Cannula  O2 Flow Rate (L/min) 2 L/min  Dialysis Weight  Weight 115 kg  Type of Weight Post-Dialysis  Post-Hemodialysis Assessment  Rinseback Volume (mL) 250 mL  KECN 41.7 V  Dialyzer Clearance Lightly streaked  Duration of HD Treatment -hour(s) 2 hour(s)  Hemodialysis Intake (mL) 500 mL  UF Total -Machine (mL) 1000 mL  Net UF (mL) 500 mL  Tolerated HD Treatment Yes  Post-Hemodialysis Comments pt tolerated tx well  Hemodialysis Catheter Right Subclavian Double-lumen  No Placement Date or Time found.   Placed prior to admission: Yes  Orientation: Right  Access Location: Subclavian  Hemodialysis Catheter Type: Double-lumen  Site Condition No complications  Blue Lumen Status Heparin locked  Red Lumen Status Heparin locked  Catheter fill solution Heparin 1000 units/ml  Catheter fill volume (Arterial) 1.5 cc  Catheter fill volume (Venous) 1.5  Dressing Type Biopatch  Dressing Status Clean;Dry;Intact  Drainage Description None  Post treatment catheter status Capped and Clamped

## 2018-11-23 NOTE — Progress Notes (Signed)
HD Tx End   11/23/18 1545  Vital Signs  Pulse Rate 81  Resp (!) 21  BP (!) 145/97  Oxygen Therapy  SpO2 98 %  During Hemodialysis Assessment  Blood Flow Rate (mL/min) 200 mL/min  Arterial Pressure (mmHg) -160 mmHg  Venous Pressure (mmHg) 150 mmHg  Transmembrane Pressure (mmHg) 50 mmHg  Ultrafiltration Rate (mL/min) 500 mL/min  Dialysate Flow Rate (mL/min) 800 ml/min  Conductivity: Machine  14  HD Safety Checks Performed Yes  Dialysis Fluid Bolus Normal Saline  Bolus Amount (mL) 250 mL  Intra-Hemodialysis Comments Tx completed;Tolerated well  Hemodialysis Catheter Right Subclavian Double-lumen  No Placement Date or Time found.   Placed prior to admission: Yes  Orientation: Right  Access Location: Subclavian  Hemodialysis Catheter Type: Double-lumen  Site Condition No complications  Blue Lumen Status Infusing;Flushed;Saline locked;Heparin locked;Capped (Central line)  Red Lumen Status Infusing;Flushed;Saline locked;Capped (Central line);Heparin locked

## 2018-11-23 NOTE — NC FL2 (Signed)
[place] LEVEL OF CARE SCREENING TOOL     IDENTIFICATION  Patient Name: Michele Bartlett Birthdate: 10/17/1970 Sex: female Admission Date (Current Location): 10/31/2018  [place] and [place] Number:  Michele Bartlett ([id number] [phone]) Facility and Address:  [facility], [address], [place],  [address]      Provider Number: [id number]  Attending Physician Name and Address:  Nicholes Mango, MD  Relative Name and Phone Number:       Current Level of Care: Hospital Recommended Level of Care: [place] Prior Approval Number:    Date Approved/Denied:   PASRR Number:    Discharge Plan: SNF    Current Diagnoses: Patient Active Problem List   Diagnosis Date Noted  . Uncontrolled type 2 diabetes mellitus with hyperglycemia ([place]) 10/22/2018  . End stage renal disease ([place]) 10/22/2018  . Mild intermittent asthma without complication 76/22/6333  . Pedal edema 10/09/2018  . Encounter for general adult medical examination with abnormal findings 10/09/2018  . Tachycardia 10/09/2018  . Chronic kidney disease (CKD), stage IV (severe) ([place]) 10/04/2018  . Flu vaccine need 06/12/2018  . Type 2 diabetes mellitus with diabetic chronic kidney disease ([place]) 11/04/2017  . Essential (primary) hypertension 11/04/2017  . Moderate persistent asthma, uncomplicated 54/56/2563  . Morbid (severe) obesity due to excess calories ([place]) 11/04/2017  . Obstructive sleep apnea, adult 11/04/2017  . Acute respiratory failure with hypoxia and hypercapnia ([place]) 11/30/2015  . CAP (community acquired pneumonia) 11/30/2015  . Syncope and collapse 11/30/2015  . Diabetes mellitus without complication ([place]) 89/37/3428    Orientation RESPIRATION BLADDER Height & Weight     Self, Time, Place  O2(2 Liters Oxygen. ) Continent Weight: 253 lb 8.5 oz (115 kg) Height:  5\' 1"  (154.9 cm)  BEHAVIORAL SYMPTOMS/MOOD NEUROLOGICAL BOWEL NUTRITION STATUS      Continent  Diet(Diet: Carb Modified. )  AMBULATORY STATUS COMMUNICATION OF NEEDS Skin   Extensive Assist Verbally Normal                       Personal Care Assistance Level of Assistance  Bathing, Feeding, Dressing Bathing Assistance: Limited assistance Feeding assistance: Independent Dressing Assistance: Limited assistance     Functional Limitations Info  Sight, Hearing, Speech Sight Info: Adequate Hearing Info: Adequate Speech Info: Adequate    SPECIAL CARE FACTORS FREQUENCY  PT (By licensed PT), OT (By licensed OT)(Dialysis MWF [employer]. )     PT Frequency: (5) OT Frequency: (5)            Contractures      Additional Factors Info  Code Status, Allergies Code Status Info: (Full Code. ) Allergies Info: (Novocain Procaine)           Current Medications (11/23/2018):  This is the current hospital active medication list Current Facility-Administered Medications  Medication Dose Route Frequency Provider Last Rate Last Dose  . acetaminophen (TYLENOL) tablet 650 mg  650 mg Oral Q6H PRN Schnier, Dolores Lory, MD   650 mg at 11/23/18 7681   Or  . acetaminophen (TYLENOL) suppository 650 mg  650 mg Rectal Q6H PRN Schnier, Dolores Lory, MD      . ALPRAZolam Duanne Moron) tablet 0.5 mg  0.5 mg Oral QHS PRN Delana Meyer Dolores Lory, MD   0.5 mg at 11/21/18 2149  . alteplase (CATHFLO ACTIVASE) 5 mg in sodium chloride 0.9 % 50 mL (0.1 mg/mL) infusion  5 mg Intravenous Once Schnier, Dolores Lory, MD      . alteplase (CATHFLO  ACTIVASE) 5 mg in sodium chloride 0.9 % 50 mL (0.1 mg/mL) infusion  5 mg Intravenous Once Schnier, Dolores Lory, MD      . alum & mag hydroxide-simeth (MAALOX/MYLANTA) 200-200-20 MG/5ML suspension 30 mL  30 mL Oral Q4H PRN Schnier, Dolores Lory, MD   30 mL at 11/23/18 0820  . amiodarone (PACERONE) tablet 200 mg  200 mg Oral BID Delana Meyer Dolores Lory, MD   200 mg at 11/23/18 0820  . bisacodyl (DULCOLAX) suppository 10 mg  10 mg Rectal Daily PRN Schnier, Dolores Lory, MD   10 mg at 11/23/18  4656  . butalbital-acetaminophen-caffeine (FIORICET, ESGIC) 50-325-40 MG per tablet 1 tablet  1 tablet Oral Q6H PRN Schnier, Dolores Lory, MD   1 tablet at 11/23/18 0820  . calcium carbonate (TUMS - dosed in mg elemental calcium) chewable tablet 200 mg of elemental calcium  1 tablet Oral BID PRN Schnier, Dolores Lory, MD      . Chlorhexidine Gluconate Cloth 2 % PADS 6 each  6 each Topical Q0600 Schnier, Dolores Lory, MD   6 each at 11/23/18 [phone]  . docusate sodium (COLACE) capsule 100 mg  100 mg Oral BID Delana Meyer Dolores Lory, MD   100 mg at 11/23/18 0820  . feeding supplement (NEPRO CARB STEADY) liquid 237 mL  237 mL Oral BID BM Gouru, Aruna, MD   237 mL at 11/23/18 1706  . fluticasone (FLONASE) 50 MCG/ACT nasal spray 1 spray  1 spray Each Nare Daily Gouru, Aruna, MD      . heparin 5000 UNIT/ML injection           . heparin injection 5,000 Units  5,000 Units Subcutaneous Q8H Schnier, Dolores Lory, MD   5,000 Units at 11/23/18 1726  . insulin aspart (novoLOG) injection 0-5 Units  0-5 Units Subcutaneous QHS Schnier, Dolores Lory, MD      . insulin aspart (novoLOG) injection 0-9 Units  0-9 Units Subcutaneous TID WC Schnier, Dolores Lory, MD   2 Units at 11/21/18 1807  . loratadine (CLARITIN) tablet 10 mg  10 mg Oral Daily PRN Gouru, Aruna, MD   10 mg at 11/23/18 1725  . midodrine (PROAMATINE) tablet 5 mg  5 mg Oral TID WC Schnier, Dolores Lory, MD   5 mg at 11/23/18 1725  . mometasone-formoterol (DULERA) 100-5 MCG/ACT inhaler 2 puff  2 puff Inhalation BID Schnier, Dolores Lory, MD   2 puff at 11/23/18 [phone]  . multivitamin (RENA-VIT) tablet 1 tablet  1 tablet Oral QHS Gouru, Aruna, MD      . ondansetron (ZOFRAN) injection 4 mg  4 mg Intravenous Q6H PRN Schnier, Dolores Lory, MD      . pantoprazole (PROTONIX) EC tablet 20 mg  20 mg Oral Daily Schnier, Dolores Lory, MD   20 mg at 11/23/18 0174  . senna (SENOKOT) tablet 8.6 mg  1 tablet Oral QHS PRN Schnier, Dolores Lory, MD      . simethicone [facility]) chewable tablet 80 mg  80 mg Oral  Q6H PRN Sela Hua, MD   80 mg at 11/23/18 0820  . sodium phosphate (FLEET) 7-19 GM/118ML enema 1 enema  1 enema Rectal Once PRN Schnier, Dolores Lory, MD      . sodium phosphate (FLEET) 7-19 GM/118ML enema 1 enema  1 enema Rectal Once Gouru, Aruna, MD         Discharge Medications: Please see discharge summary for a list of discharge medications.  Relevant Imaging Results:  Relevant Lab Results:  Additional Information (SSN: [id number])  Cova Knieriem, Veronia Beets, LCSW

## 2018-11-23 NOTE — Progress Notes (Signed)
Post HD Tx Assessment  Tolerated tx well, 562mL removed (net). Will resume normal tx schedule tomorrow for full time (today was partial tx per MD order).    11/23/18 1545  Neurological  Level of Consciousness Alert  Orientation Level Oriented X4  Respiratory  Respiratory Pattern Regular  Bilateral Breath Sounds Diminished  Cardiac  Pulse Regular  Heart Sounds S1, S2  ECG Monitor Yes  Cardiac Rhythm NSR  Vascular  R Radial Pulse +2  L Radial Pulse +2  Integumentary  Integumentary (WDL) X  Skin Color Appropriate for ethnicity  Skin Condition Dry  Skin Integrity Surgical Incision (see LDA)  Musculoskeletal  Musculoskeletal (WDL) X  Generalized Weakness Yes  Gastrointestinal  Bowel Sounds Assessment Active  Psychosocial  Psychosocial (WDL) WDL

## 2018-11-23 NOTE — Progress Notes (Signed)
HD Tx Start   11/23/18 1335  Vital Signs  Pulse Rate 81  Resp 20  BP 118/90  Oxygen Therapy  SpO2 98 %  During Hemodialysis Assessment  Blood Flow Rate (mL/min) 400 mL/min  Arterial Pressure (mmHg) -220 mmHg  Venous Pressure (mmHg) 180 mmHg  Transmembrane Pressure (mmHg) 50 mmHg  Ultrafiltration Rate (mL/min) 500 mL/min  Dialysate Flow Rate (mL/min) 800 ml/min  Conductivity: Machine  14  HD Safety Checks Performed Yes  Dialysis Fluid Bolus Normal Saline  Bolus Amount (mL) 250 mL  Intra-Hemodialysis Comments Tx initiated  Hemodialysis Catheter Right Subclavian Double-lumen  No Placement Date or Time found.   Placed prior to admission: Yes  Orientation: Right  Access Location: Subclavian  Hemodialysis Catheter Type: Double-lumen  Site Condition No complications  Blue Lumen Status Infusing  Red Lumen Status Infusing

## 2018-11-23 NOTE — Care Management Note (Addendum)
Case Management Note  Patient Details  Name: Michele Bartlett MRN: [id number] Date of Birth: May 29, 1971  Subjective/Objective:        Patient is from home with mother.  She is a HD patient MWF at [facility].  She has Medicare and Medicaid.  Denies difficulties obtaining medications.  Current with PCP.  On home oxygen at night with CPAP.  No equipment needs per family.  PT has recommended SNF; family is willing to have patient go to STR if she is unable to ambulate when medically clear for DC.   Open to [facility] for [facility] currently.   Preference for SNF is [facility] and then [facility].   Will continue to assist with DC planning.             Action/Plan:   Expected Discharge Date:  11/19/18               Expected Discharge Plan:     In-House Referral:     Discharge planning Services  CM Consult  Post Acute Care Choice:    Choice offered to:     DME Arranged:    DME Agency:     HH Arranged:    HH Agency:     Status of Service:  In process, will continue to follow  If discussed at Long Length of Stay Meetings, dates discussed:    Additional Comments:  Elza Rafter, RN 11/23/2018, 4:32 PM

## 2018-11-23 NOTE — Progress Notes (Signed)
[facility] Daily Progress Note   Vascular Surgery Procedure Note   Permcath ports infused with TPA.  Easily flushed both ports with saline. Packed with both ports with heparin. Patient tolerated without complication.  OK to use for dialysis.  Discussed with Dr. Ellis Parents Kandise Riehle PA-C 11/23/2018 1:14 PM

## 2018-11-23 NOTE — Progress Notes (Signed)
TPA started to both ports of perm cath. Pt. Pleasant, cooperative. Pt. Belching freq., + gas passed. Pt. Cleansed x3 after 1 formed & 2 mod. soft stools. Partial bath given. Pt. Tolerated well. Oral care given.

## 2018-11-23 NOTE — Progress Notes (Signed)
Pre HD Tx   11/23/18 1330  Vital Signs  Temp 98.7 F (37.1 C)  Temp Source Oral  Pulse Rate 80  Pulse Rate Source Monitor  Resp 20  BP 119/88  BP Location Right Leg  BP Method Automatic  Patient Position (if appropriate) Lying  Oxygen Therapy  SpO2 97 %  O2 Device Nasal Cannula  O2 Flow Rate (L/min) 2 L/min  Pain Assessment  Pain Scale 0-10  Pain Score 0  Dialysis Weight  Weight 115.6 kg  Type of Weight Pre-Dialysis  Time-Out for Hemodialysis  What Procedure? hemodialysis  Pt Identifiers(min of two) First/Last Name;MRN/Account#  Correct Site? Yes  Correct Side? Yes  Correct Procedure? Yes  Consents Verified? Yes  Rad Studies Available? No  Safety Precautions Reviewed? Yes  Engineer, civil (consulting) Number 3  Station Number 1  UF/Alarm Test Passed  Conductivity: Meter 14  Conductivity: Machine  14  pH 7.4  Reverse Osmosis main  Normal Saline Lot Number [id number]  Dialyzer Lot Number [id number]  Disposable Set Lot Number [id number]  Machine Temperature 98.6 F (37 C)  Musician and Audible Yes  Blood Lines Intact and Secured Yes  Pre Treatment Patient Checks  Vascular access used during treatment Catheter  Hepatitis B Surface Antigen Results Negative  Date Hepatitis B Surface Antigen Drawn 11/06/18  Hepatitis B Surface Antibody  (<10)  Date Hepatitis B Surface Antibody Drawn 11/06/18  Hemodialysis Consent Verified Yes  Hemodialysis Standing Orders Initiated Yes  ECG (Telemetry) Monitor On Yes  Prime Ordered Normal Saline  Length of  DialysisTreatment -hour(s) 2 Hour(s)  Dialysis Treatment Comments pt stable   Dialyzer Elisio 17H NR  Dialysate 3K, 2.5 Ca  Dialysate Flow Ordered 800  Blood Flow Rate Ordered 400 mL/min  Ultrafiltration Goal 0.5 Liters  Pre Treatment Labs CBC;Renal panel  Dialysis Blood Pressure Support Ordered Normal Saline  Education / Care Plan  Dialysis Education Provided Yes  Documented Education in Care Plan Yes  Fistula / Graft  Left Forearm Arteriovenous fistula  Placement Date/Time: 11/15/18 0924   Placed prior to admission: No  Orientation: Left  Access Location: Forearm  Access Type: Arteriovenous fistula  Site Condition No complications  Fistula / Graft Assessment Absent ;Thrill;Bruit  Hemodialysis Catheter Right Subclavian Double-lumen  No Placement Date or Time found.   Placed prior to admission: Yes  Orientation: Right  Access Location: Subclavian  Hemodialysis Catheter Type: Double-lumen  Site Condition No complications  Blue Lumen Status Capped (Central line)  Red Lumen Status Capped (Central line)  Dressing Type Biopatch  Dressing Status Clean;Dry;Intact  Drainage Description None

## 2018-11-23 NOTE — Progress Notes (Signed)
[facility], [place] 11/23/18  Subjective:  Patient was in special procedures today to declot her PermCath. It appears that her PermCath is now working. Yesterday when intervention of her axis was attempted she went apneic again.   Objective:  Vital signs in last 24 hours:  Temp:  [96.7 F (35.9 C)-98.7 F (37.1 C)] 98.7 F (37.1 C) (02/27 1330) Pulse Rate:  [58-112] 82 (02/27 1515) Resp:  [16-33] 25 (02/27 1515) BP: (89-154)/(51-131) 130/92 (02/27 1515) SpO2:  [56 %-100 %] 98 % (02/27 1515) FiO2 (%):  [100 %] 100 % (02/26 1810) Weight:  [115.6 kg] 115.6 kg (02/27 1330)  Weight change: 0.2 kg Filed Weights   11/23/18 0447 11/23/18 0944 11/23/18 1330  Weight: 115.6 kg 115.6 kg 115.6 kg    Intake/Output:    Intake/Output Summary (Last 24 hours) at 11/23/2018 1536 Last data filed at 11/23/2018 1011 Gross per 24 hour  Intake 14.5 ml  Output 3 ml  Net 11.5 ml     Physical Exam: General:  Chronically ill-appearing, laying in the bed  HEENT  pale conjunctiva, moist oral mucous membranes  Neck  Short, thick  Pulm/lungs  normal breathing effort, CTAB  CVS/Heart  regular, no rub  Abdomen:   Soft, nontender  Extremities:  No peripheral edema  Neurologic:  Alert, able to follow simple commands  Skin:  No acute rashes  Access:  Right IJ PermCath, left arm new AV fistula       Basic Metabolic Panel:  Recent Labs  Lab 11/15/2018 2012 11/18/18 0355 11/19/18 1828 11/15/2018 0531 11/23/18 1332  NA 133* 135  --  135  --   K 3.9 4.1  --  3.8  --   CL 96* 100  --  97*  --   CO2 23 28  --  28  --   GLUCOSE 189* 130*  --  93  --   BUN 20 22*  --  41*  --   CREATININE 4.01* 4.43*  --  4.51*  --   CALCIUM 8.6* 8.8*  --  8.9  --   MG  --   --  2.0 2.2  --   PHOS  --   --   --   --  5.2*     CBC: Recent Labs  Lab 11/01/2018 2012 11/18/18 0355 11/20/18 0545 11/23/18 0503  WBC 8.7 11.3* QUESTIONABLE IDENTIFICATION / INCORRECTLY LABELED  SPECIMEN 12.2*  NEUTROABS 6.5  --   --   --   HGB 13.3 13.0 QUESTIONABLE IDENTIFICATION / INCORRECTLY LABELED SPECIMEN 10.9*  HCT 42.3 41.2 QUESTIONABLE IDENTIFICATION / INCORRECTLY LABELED SPECIMEN 33.7*  MCV 91.0 90.9 QUESTIONABLE IDENTIFICATION / INCORRECTLY LABELED SPECIMEN 89.4  PLT 233 229 QUESTIONABLE IDENTIFICATION / INCORRECTLY LABELED SPECIMEN 153     No results found for: HEPBSAG, HEPBSAB, HEPBIGM    Microbiology:  Recent Results (from the past 240 hour(s))  Culture, blood (Routine X 2) w Reflex to ID Panel     Status: None   Collection Time: 10/29/2018  8:13 PM  Result Value Ref Range Status   Specimen Description BLOOD RIGHT FATTY CASTS  Final   Special Requests   Final    BOTTLES DRAWN AEROBIC AND ANAEROBIC Blood Culture adequate volume   Culture   Final    NO GROWTH 5 DAYS Performed at [facility], [address] [place], [place] [address]    Report Status 11/23/2018 FINAL  Final  MRSA PCR Screening     Status:  None   Collection Time: 11/18/18  3:36 AM  Result Value Ref Range Status   MRSA by PCR NEGATIVE NEGATIVE Final    Comment:        The GeneXpert MRSA Assay (FDA approved for NASAL specimens only), is one component of a comprehensive MRSA colonization surveillance program. It is not intended to diagnose MRSA infection nor to guide or monitor treatment for MRSA infections. Performed at [facility], [address] [place], [place] [address]   Culture, blood (Routine X 2) w Reflex to ID Panel     Status: None   Collection Time: 11/18/18  3:55 AM  Result Value Ref Range Status   Specimen Description BLOOD RIGHT HAND  Final   Special Requests   Final    BOTTLES DRAWN AEROBIC AND ANAEROBIC Blood Culture adequate volume   Culture   Final    NO GROWTH 5 DAYS Performed at [facility], [address], [place], [place] [address]    Report Status 11/23/2018 FINAL  Final    Coagulation Studies: No results for input(s):  LABPROT, INR in the last 72 hours.  Urinalysis: No results for input(s): COLORURINE, LABSPEC, PHURINE, GLUCOSEU, HGBUR, BILIRUBINUR, KETONESUR, PROTEINUR, UROBILINOGEN, NITRITE, LEUKOCYTESUR in the last 72 hours.  Invalid input(s): APPERANCEUR    Imaging: No results found.   Medications:   . alteplase (TPA-ACTIVASE) *DIALYSIS CATH* 5 mg infusion    . alteplase (TPA-ACTIVASE) *DIALYSIS CATH* 5 mg infusion     . amiodarone  200 mg Oral BID  . Chlorhexidine Gluconate Cloth  6 each Topical Q0600  . docusate sodium  100 mg Oral BID  . feeding supplement (NEPRO CARB STEADY)  237 mL Oral BID BM  . heparin      . heparin  5,000 Units Subcutaneous Q8H  . insulin aspart  0-5 Units Subcutaneous QHS  . insulin aspart  0-9 Units Subcutaneous TID WC  . midodrine  5 mg Oral TID WC  . mometasone-formoterol  2 puff Inhalation BID  . multivitamin  1 tablet Oral QHS  . pantoprazole  20 mg Oral Daily   acetaminophen **OR** acetaminophen, ALPRAZolam, alum & mag hydroxide-simeth, bisacodyl, butalbital-acetaminophen-caffeine, calcium carbonate, [DISCONTINUED] ondansetron **OR** ondansetron (ZOFRAN) IV, senna, simethicone, sodium phosphate  Assessment/ Plan:  48 y.o. African-American female with end-stage renal disease started hemodialysis January 2020, hypertension, proteinuria, Down syndrome, morbid obesity, obstructive sleep apnea, not using CPAP due to claustrophobia now admitted for hypercarbic acute respiratory failure and generalized weakness  ESRD/MWF/[name] [facility] dialysis/[facility]/111 kg/ 240 min  End-stage renal disease -patient's hemodialysis catheter was not working very well yesterday.  Therefore she underwent declotting of the access.we shall replace left upper sternum AV access.  When facial grams attempted patient went apneic after she was given sedation.  Therefore no sedated procedure to be performed at the moment.  Acute respiratory failure, with hypoxia and hypercapnia -apneic  spell during attempted facial gram yesterday.  Recommend regular use of CPAP and BiPAP.  Hypotension -Continue midodrine 5 mg p.o. 3 times daily.  Cardiac history 2D echo October 20, 2018: Normal left ventricular systolic function, mild LVH, severe tricuspid regurgitation and severe pulmonary hypertension, severely enlarged right atrium    LOS: 5 Michele Bartlett 2/27/20203:36 PM  [place], [place]  Note: This note was prepared with Dragon dictation. Any transcription errors are unintentional

## 2018-11-24 LAB — GLUCOSE, CAPILLARY
Glucose-Capillary: 128 mg/dL — ABNORMAL HIGH (ref 70–99)
Glucose-Capillary: 146 mg/dL — ABNORMAL HIGH (ref 70–99)
Glucose-Capillary: 141 mg/dL — ABNORMAL HIGH (ref 70–99)

## 2018-11-24 LAB — PHOSPHORUS: Phosphorus: 4.7 mg/dL — ABNORMAL HIGH (ref 2.5–4.6)

## 2018-11-24 NOTE — Progress Notes (Signed)
[facility] Daily Progress Note   Vascular Surgery Communication Note 1 Days Post-Op: Successful TPA to Permcath 2 Days Post-Op: Attempted Fistulogram however aborted due to respiratory failure  Permcath functioning appropriately. Due to the patients respiratory status / intolerance to sedation no further plan to intervene open or endovascularly this inpatient stay.  Probability of successfully revascularizating her fistula endovascularly this far out from clotting is low.  Will see the patient in clinic to plan how to move forward with a permanent dialysis access  Vascular Surgery will sign off at this time. Please re-consult if needed.  Discussed with Dr. Ellis Parents Tavia Stave PA-C 11/24/2018 12:30 PM

## 2018-11-24 NOTE — Progress Notes (Signed)
HD Tx End   11/24/18 1330  Vital Signs  Pulse Rate 73  Resp 16  BP (!) 129/91  Oxygen Therapy  SpO2 100 %  During Hemodialysis Assessment  Blood Flow Rate (mL/min) 200 mL/min  Arterial Pressure (mmHg) -180 mmHg  Venous Pressure (mmHg) 190 mmHg  Transmembrane Pressure (mmHg) 50 mmHg  Ultrafiltration Rate (mL/min) 720 mL/min  Dialysate Flow Rate (mL/min) 800 ml/min  Conductivity: Machine  14  HD Safety Checks Performed Yes  Dialysis Fluid Bolus Normal Saline  Bolus Amount (mL) 250 mL  Intra-Hemodialysis Comments Tx completed;Tolerated well

## 2018-11-24 NOTE — Progress Notes (Signed)
Pre HD Assessment   11/24/18 0955  Neurological  Level of Consciousness Alert  Orientation Level Oriented X4  Respiratory  Respiratory Pattern Regular  Bilateral Breath Sounds Diminished  Cardiac  Pulse Regular  Heart Sounds S1, S2  ECG Monitor Yes  Vascular  R Radial Pulse +2  L Radial Pulse +2  Integumentary  Integumentary (WDL) X  Skin Color Appropriate for ethnicity  Skin Condition Dry  Skin Integrity Surgical Incision (see LDA)  Musculoskeletal  Musculoskeletal (WDL) X  Generalized Weakness Yes  GU Assessment  Genitourinary (WDL) X  Genitourinary Symptoms External catheter  Psychosocial  Psychosocial (WDL) WDL

## 2018-11-24 NOTE — Progress Notes (Signed)
[facility], [place] 11/24/18  Subjective:  Pt seen and evaluated during HD. Tolerating well. UF target 3kg.   Objective:  Vital signs in last 24 hours:  Temp:  [96.9 F (36.1 C)-98.7 F (37.1 C)] 97 F (36.1 C) (02/28 0945) Pulse Rate:  [74-98] 82 (02/28 1115) Resp:  [15-26] 18 (02/28 1100) BP: (111-147)/(39-97) 119/85 (02/28 1100) SpO2:  [92 %-100 %] 95 % (02/28 1115) Weight:  [008 kg-115.6 kg] 115 kg (02/28 0945)  Weight change: -0.2 kg Filed Weights   11/23/18 1330 11/23/18 1550 11/24/18 0945  Weight: 115.6 kg 115 kg 115 kg    Intake/Output:    Intake/Output Summary (Last 24 hours) at 11/24/2018 1129 Last data filed at 11/24/2018 0450 Gross per 24 hour  Intake -  Output 900 ml  Net -900 ml     Physical Exam: General:  Chronically ill-appearing, laying in the bed  HEENT   moist oral mucous membranes  Neck  Short, thick  Pulm/lungs  normal breathing effort, CTAB  CVS/Heart  regular, no rub  Abdomen:   Soft, nontender  Extremities:  No peripheral edema  Neurologic:  Alert, able to follow simple commands  Skin:  No acute rashes  Access:  Right IJ PermCath, left arm new AV fistula       Basic Metabolic Panel:  Recent Labs  Lab 11/05/2018 2012 11/18/18 0355 11/19/18 1828 11/22/18 0531 11/23/18 1332  NA 133* 135  --  135  --   K 3.9 4.1  --  3.8  --   CL 96* 100  --  97*  --   CO2 23 28  --  28  --   GLUCOSE 189* 130*  --  93  --   BUN 20 22*  --  41*  --   CREATININE 4.01* 4.43*  --  4.51*  --   CALCIUM 8.6* 8.8*  --  8.9  --   MG  --   --  2.0 2.2  --   PHOS  --   --   --   --  5.2*     CBC: Recent Labs  Lab 10/31/2018 2012 11/18/18 0355 11/20/18 0545 11/23/18 0503  WBC 8.7 11.3* QUESTIONABLE IDENTIFICATION / INCORRECTLY LABELED SPECIMEN 12.2*  NEUTROABS 6.5  --   --   --   HGB 13.3 13.0 QUESTIONABLE IDENTIFICATION / INCORRECTLY LABELED SPECIMEN 10.9*  HCT 42.3 41.2 QUESTIONABLE IDENTIFICATION / INCORRECTLY  LABELED SPECIMEN 33.7*  MCV 91.0 90.9 QUESTIONABLE IDENTIFICATION / INCORRECTLY LABELED SPECIMEN 89.4  PLT 233 229 QUESTIONABLE IDENTIFICATION / INCORRECTLY LABELED SPECIMEN 153     No results found for: HEPBSAG, HEPBSAB, HEPBIGM    Microbiology:  Recent Results (from the past 240 hour(s))  Culture, blood (Routine X 2) w Reflex to ID Panel     Status: None   Collection Time: 11/10/2018  8:13 PM  Result Value Ref Range Status   Specimen Description BLOOD RIGHT FATTY CASTS  Final   Special Requests   Final    BOTTLES DRAWN AEROBIC AND ANAEROBIC Blood Culture adequate volume   Culture   Final    NO GROWTH 5 DAYS Performed at [facility], [address] [place], [place] [address]    Report Status 11/23/2018 FINAL  Final  MRSA PCR Screening     Status: None   Collection Time: 11/18/18  3:36 AM  Result Value Ref Range Status   MRSA by PCR NEGATIVE NEGATIVE Final    Comment:  The GeneXpert MRSA Assay (FDA approved for NASAL specimens only), is one component of a comprehensive MRSA colonization surveillance program. It is not intended to diagnose MRSA infection nor to guide or monitor treatment for MRSA infections. Performed at [facility], [address] [place], [place] [address]   Culture, blood (Routine X 2) w Reflex to ID Panel     Status: None   Collection Time: 11/18/18  3:55 AM  Result Value Ref Range Status   Specimen Description BLOOD RIGHT HAND  Final   Special Requests   Final    BOTTLES DRAWN AEROBIC AND ANAEROBIC Blood Culture adequate volume   Culture   Final    NO GROWTH 5 DAYS Performed at [facility], [address], [place], [place] [address]    Report Status 11/23/2018 FINAL  Final    Coagulation Studies: No results for input(s): LABPROT, INR in the last 72 hours.  Urinalysis: No results for input(s): COLORURINE, LABSPEC, PHURINE, GLUCOSEU, HGBUR, BILIRUBINUR, KETONESUR, PROTEINUR, UROBILINOGEN, NITRITE,  LEUKOCYTESUR in the last 72 hours.  Invalid input(s): APPERANCEUR    Imaging: No results found.   Medications:   . alteplase (TPA-ACTIVASE) *DIALYSIS CATH* 5 mg infusion    . alteplase (TPA-ACTIVASE) *DIALYSIS CATH* 5 mg infusion     . amiodarone  200 mg Oral BID  . Chlorhexidine Gluconate Cloth  6 each Topical Q0600  . docusate sodium  100 mg Oral BID  . feeding supplement (NEPRO CARB STEADY)  237 mL Oral BID BM  . fluticasone  1 spray Each Nare Daily  . heparin  5,000 Units Subcutaneous Q8H  . insulin aspart  0-5 Units Subcutaneous QHS  . insulin aspart  0-9 Units Subcutaneous TID WC  . midodrine  5 mg Oral TID WC  . mometasone-formoterol  2 puff Inhalation BID  . multivitamin  1 tablet Oral QHS  . pantoprazole  20 mg Oral Daily  . sodium phosphate  1 enema Rectal Once   acetaminophen **OR** acetaminophen, ALPRAZolam, alum & mag hydroxide-simeth, bisacodyl, butalbital-acetaminophen-caffeine, calcium carbonate, loratadine, [DISCONTINUED] ondansetron **OR** ondansetron (ZOFRAN) IV, senna, simethicone, sodium phosphate  Assessment/ Plan:  48 y.o. African-American female with end-stage renal disease started hemodialysis January 2020, hypertension, proteinuria, Down syndrome, morbid obesity, obstructive sleep apnea, not using CPAP due to claustrophobia now admitted for hypercarbic acute respiratory failure and generalized weakness  ESRD/MWF/[name] [facility] dialysis/[facility]/111 kg/ 240 min  End-stage renal disease -HD catheter working well now, unable to assess LUE AVF given apneic spell with exposure to sedation.    Acute respiratory failure, with hypoxia and hypercapnia -apneic spell during attempted fistulogram on Wednesday.  Recommend continue use of CPAP at thome.    Hypotension -Continue midodrine 5 mg p.o. 3 times daily.  Cardiac history 2D echo October 20, 2018: Normal left ventricular systolic function, mild LVH, severe tricuspid regurgitation and severe pulmonary  hypertension, severely enlarged right atrium    LOS: 6 Kemya Shed 2/28/202011:29 AM  [place], [place]  Note: This note was prepared with Dragon dictation. Any transcription errors are unintentional

## 2018-11-24 NOTE — Progress Notes (Signed)
Physical Therapy Treatment Patient Details Name: Michele Bartlett MRN: [id number] DOB: 1970/11/04 Today's Date: 11/24/2018    History of Present Illness 48 y.o. female who presents with chief complaint of weakness, arrived obtunded and hypotensive and hypoxic.  She recently developed end-stage renal disease and was started on dialysis last month, recent AV fistula placement and required general anesthesia with intubation.  Subsequently her mother states that she had a difficult time recovering from the anesthesia.  She missed a dialysis session on Wednesday, and instead had a done on Thursday.  Pt for fistulogram 2/26/ which was cancelled as pt became apneic.  Pt s/p declotting of permcath on 2/27.  Per Dr. Margaretmary Eddy on 2/28 prior to PT session, no activity restrictions, bed rest with bathroom privileges no longer applicable.     PT Comments    Michele Bartlett was very pleasant and agreeable to work with therapy and put forth good effort.  Pt was +orthostatic again this session with BP dropping to 75/65 in standing but recovering once returned to supine (see general comments below for more details). Pt requires mod +2 assist to stand from elevated bed height and demonstrates fatigue and poor activity tolerance with sit<>stand and lateral scooting on bed. Follow up recommendations remain appropriate.    Follow Up Recommendations  SNF;Supervision/Assistance - 24 hour     Equipment Recommendations  Judie Petit RW)    Recommendations for Other Services       Precautions / Restrictions Precautions Precautions: Fall;Other (comment) Precaution Comments: monitor O2, HR, BP (+orthostatic, asymptomatic) Restrictions Weight Bearing Restrictions: No    Mobility  Bed Mobility Overal bed mobility: Needs Assistance Bed Mobility: Supine to Sit;Sit to Supine     Supine to sit: Min guard Sit to supine: Mod assist;+2 for physical assistance   General bed mobility comments: Increased effort and time and min guard  for safety for supine>sit.  Mod +2 assist for sit>supine.   Transfers Overall transfer level: Needs assistance Equipment used: Judie Petit RW) Transfers: Sit to/from Stand;Lateral/Scoot Transfers Sit to Stand: Mod assist;+2 physical assistance;From elevated surface        Lateral/Scoot Transfers: Min assist General transfer comment: Pt unsuccessful with first sit>stand attempt with mod+1 assist.  On second trial elevated bed slightly and pt required assist to boost to standing.  To scoot toward HOB once sitting pt required intermittent min assist and required 2 rest breaks due to fatigue.    Ambulation/Gait             General Gait Details: Not safe to attempt at this time due to orthostatic hypotension.    Stairs             Wheelchair Mobility    Modified Rankin (Stroke Patients Only)       Balance Overall balance assessment: Needs assistance Sitting-balance support: No upper extremity supported;Feet supported Sitting balance-Leahy Scale: Good Sitting balance - Comments: Pt able to sit EOB without UE support and reach forward outside of BOS without LOB   Standing balance support: Bilateral upper extremity supported;During functional activity Standing balance-Leahy Scale: Poor Standing balance comment: Pt relies on BUE support from RW and outside physical assist                            Cognition Arousal/Alertness: Awake/alert Behavior During Therapy: WFL for tasks assessed/performed Overall Cognitive Status: History of cognitive impairments - at baseline  General Comments: Pt with h/o down's syndrome.  Appropriate throughout session, noted some delay with processing.       Exercises Other Exercises Other Exercises: Cues for pursed lip breathing with teachback with pt sitting EOB.   Other Exercises: Forward reaching outside of BOS while sitting EOB without UE support x10 each UE.     General Comments  General comments (skin integrity, edema, etc.): Pt on 2L O2 throughout session.  BP readings taken on R calf throughout session.  In supine HR 87, SpO2 94%, BP 134/88. Sitting EOB SpO2 91%, HR 97, BP 116/91.  Cues for pursed lip breathing.  BP in standing 75/65, pt denies dizziness.  Pt assisted back to sitting.  HR 102, SpO2 92%.  Pt assisted back to supine and BP 82/51.  BP taken again in supine after ~2 minutes reading 100/75.  Pt asymptomatic throughout.       Pertinent Vitals/Pain Pain Assessment: No/denies pain    Home Living                      Prior Function            PT Goals (current goals can now be found in the care plan section) Acute Rehab PT Goals Patient Stated Goal: to walk PT Goal Formulation: With patient Time For Goal Achievement: 12/05/18 Potential to Achieve Goals: Fair Progress towards PT goals: Not progressing toward goals - comment(due to orthostatics)    Frequency    Min 2X/week      PT Plan Current plan remains appropriate    Co-evaluation              AM-PAC PT "6 Clicks" Mobility   Outcome Measure  Help needed turning from your back to your side while in a flat bed without using bedrails?: A Little Help needed moving from lying on your back to sitting on the side of a flat bed without using bedrails?: A Little Help needed moving to and from a bed to a chair (including a wheelchair)?: A Lot Help needed standing up from a chair using your arms (e.g., wheelchair or bedside chair)?: A Lot Help needed to walk in hospital room?: A Lot Help needed climbing 3-5 steps with a railing? : Total 6 Click Score: 13    End of Session Equipment Utilized During Treatment: Gait belt;Oxygen Activity Tolerance: Treatment limited secondary to medical complications (Comment)(+orthostatic) Patient left: in bed;with call bell/phone within reach;with bed alarm set;with family/visitor present Nurse Communication: Mobility status;Other (comment)(BP,  SpO2) PT Visit Diagnosis: Muscle weakness (generalized) (M62.81);Unsteadiness on feet (R26.81);Difficulty in walking, not elsewhere classified (R26.2)     Time: 7867-6720 PT Time Calculation (min) (ACUTE ONLY): 38 min  Charges:  $Therapeutic Activity: 38-52 mins                     Collie Siad PT, DPT 11/24/2018, 4:28 PM

## 2018-11-24 NOTE — Progress Notes (Signed)
HD Post Tx   11/24/18 1334  Vital Signs  Pulse Rate 70  Resp 18  BP 130/89  Oxygen Therapy  SpO2 98 %  Post-Hemodialysis Assessment  Rinseback Volume (mL) 250 mL  KECN 75 V  Dialyzer Clearance Heavily streaked  Duration of HD Treatment -hour(s) 3.5 hour(s)  Hemodialysis Intake (mL) 800 mL  UF Total -Machine (mL) 3300 mL  Net UF (mL) 2500 mL  Tolerated HD Treatment Yes  Post-Hemodialysis Comments pt stable, tolerated tx well  Hemodialysis Catheter Right Subclavian Double-lumen  No Placement Date or Time found.   Placed prior to admission: Yes  Orientation: Right  Access Location: Subclavian  Hemodialysis Catheter Type: Double-lumen  Site Condition No complications  Blue Lumen Status Flushed;Saline locked;Capped (Central line);Heparin locked  Red Lumen Status Flushed;Saline locked;Capped (Central line);Heparin locked  Catheter fill solution Heparin 1000 units/ml  Catheter fill volume (Arterial) 1.5 cc  Catheter fill volume (Venous) 1.5  Dressing Type Biopatch  Dressing Status Clean;Dry;Intact  Drainage Description None  Post treatment catheter status Capped and Clamped

## 2018-11-24 NOTE — Discharge Instructions (Signed)
Vascular Surgery Discharge Instructions Continue to dialyze through our permcath as per your normal routine.  We will see you in the office to discuss moving forward with your current / new dialysis access.

## 2018-11-24 NOTE — Progress Notes (Signed)
Pt family declined bipap for tonight, stated pt was sleeping well on her Jaconita

## 2018-11-24 NOTE — Clinical Social Work Note (Signed)
Clinical Social Work Assessment  Patient Details  Name: Michele Bartlett MRN: [id number] Date of Birth: [birth year]  Date of referral:  11/24/18               Reason for consult:  Facility Placement                Permission sought to share information with:  Case Manager, Customer service manager, Family Supports Permission granted to share information::  Yes, Verbal Permission Granted  Name::      SNF  Agency::   [facility]  Relationship::     Contact Information:     Housing/Transportation Living arrangements for the past 2 months:  [place] of Information:  Parent Patient Interpreter Needed:  None Criminal Activity/Legal Involvement Pertinent to Current Situation/Hospitalization:  No - Comment as needed Significant Relationships:  Parents Lives with:  Parents Do you feel safe going back to the place where you live?  Yes Need for family participation in patient care:  Yes (Comment)  Care giving concerns:  Patient lives with parents   Social Worker assessment / plan:  CSW consulted for SNF placement. CSW met with patient's parents Synetta Fail and husband at bedside. Patient lives with parents and has disability. Parents state that they would prefer for patient to return home when medically stable but understand that she may need more care than they can provide. Parents would like to see how patient does with therapy today before making a final decision. Mother states that her preference would be [facility] and then [facility] if patient needs to go to SNF. CSW will coordinate SNF placement for back up plan if patient is unable to return home. CSW will continue to follow for discharge planning.   Employment status:  Disabled (Comment on whether or not currently receiving Disability) Insurance information:  Managed Medicare PT Recommendations:  [place] / Referral to community resources:  [place]  Patient/Family's Response to care:  Patient's parents thanked CSW for assistance   Patient/Family's Understanding of and Emotional Response to Diagnosis, Current Treatment, and Prognosis:  Family understands that patient may need more care than they can provide at time of discharge.   Emotional Assessment Appearance:  Appears stated age Attitude/Demeanor/Rapport:    Affect (typically observed):  Pleasant, Quiet Orientation:  Oriented to Self, Oriented to Place Alcohol / Substance use:  Not Applicable Psych involvement (Current and /or in the community):  No (Comment)  Discharge Needs  Concerns to be addressed:  Discharge Planning Concerns Readmission within the last 30 days:  No Current discharge risk:  Cognitively Impaired, Dependent with Mobility Barriers to Discharge:  Continued Medical Work up   [employer], [place] 11/24/2018, 12:31 PM

## 2018-11-24 NOTE — Progress Notes (Signed)
Pre HD Tx   11/24/18 0945  Vital Signs  Temp (!) 97 F (36.1 C)  Temp Source Axillary  Pulse Rate 90  Pulse Rate Source Monitor  Resp 15  BP 132/89  BP Location Right Leg  BP Method Automatic  Patient Position (if appropriate) Lying  Oxygen Therapy  SpO2 99 %  O2 Device Nasal Cannula  O2 Flow Rate (L/min) 2 L/min  Pain Assessment  Pain Scale 0-10  Pain Score 0  Dialysis Weight  Weight 115 kg  Type of Weight Pre-Dialysis  Time-Out for Hemodialysis  What Procedure? Hemodialysis  Pt Identifiers(min of two) First/Last Name;MRN/Account#  Correct Site? Yes  Correct Side? Yes  Correct Procedure? Yes  Consents Verified? Yes  Rad Studies Available? No  Safety Precautions Reviewed? Yes  Engineer, civil (consulting) Number 2  Station Number 2  UF/Alarm Test Passed  Conductivity: Meter 14  Conductivity: Machine  14  pH 7.4  Reverse Osmosis main  Normal Saline Lot Number [id number]  Dialyzer Lot Number [id number]  Disposable Set Lot Number [id number]  Machine Temperature 98.6 F (37 C)  Musician and Audible Yes  Blood Lines Intact and Secured Yes  Pre Treatment Patient Checks  Vascular access used during treatment Catheter  Hepatitis B Surface Antigen Results Negative  Date Hepatitis B Surface Antigen Drawn 11/06/18  Hepatitis B Surface Antibody  (<10)  Date Hepatitis B Surface Antibody Drawn 11/06/18  Hemodialysis Consent Verified Yes  Hemodialysis Standing Orders Initiated Yes  ECG (Telemetry) Monitor On Yes  Prime Ordered Normal Saline  Length of  DialysisTreatment -hour(s) 3.5 Hour(s)  Dialysis Treatment Comments pt stable  Dialyzer Elisio 17H NR  Dialysate 2K, 2.5 Ca  Dialysate Flow Ordered 800  Blood Flow Rate Ordered 400 mL/min  Dialysis Blood Pressure Support Ordered Normal Saline  Education / Care Plan  Dialysis Education Provided Yes  Documented Education in Care Plan Yes  Fistula / Graft Left Forearm Arteriovenous fistula  Placement Date/Time: 11/15/18  0924   Placed prior to admission: No  Orientation: Left  Access Location: Forearm  Access Type: Arteriovenous fistula  Site Condition No complications  Fistula / Graft Assessment Absent ;Thrill;Bruit  Hemodialysis Catheter Right Subclavian Double-lumen  No Placement Date or Time found.   Placed prior to admission: Yes  Orientation: Right  Access Location: Subclavian  Hemodialysis Catheter Type: Double-lumen  Site Condition No complications  Blue Lumen Status Capped (Central line)  Red Lumen Status Capped (Central line)  Dressing Type Biopatch  Dressing Status Clean;Dry;Intact  Drainage Description None

## 2018-11-24 NOTE — Progress Notes (Signed)
Post HD Assessment  UF net fluid removal during hemodialysis: 2561mL. Tolerated tx well.    11/24/18 1337  Neurological  Level of Consciousness Alert  Orientation Level Oriented X4  Respiratory  Respiratory Pattern Regular  Chest Assessment Chest expansion symmetrical  Bilateral Breath Sounds Diminished;Clear  Cardiac  Pulse Regular  Heart Sounds S1, S2  ECG Monitor Yes  Vascular  R Radial Pulse +2  L Radial Pulse +2  Edema Left lower extremity;Right lower extremity;Generalized  Generalized Edema +1  RLE Edema +1  LLE Edema +1  Integumentary  Integumentary (WDL) X  Skin Color Appropriate for ethnicity  Skin Condition Dry  Skin Integrity Surgical Incision (see LDA)  Musculoskeletal  Musculoskeletal (WDL) X  Generalized Weakness Yes  GU Assessment  Genitourinary (WDL) X  Genitourinary Symptoms External catheter  Psychosocial  Psychosocial (WDL) WDL     11/24/18 1337  Neurological  Level of Consciousness Alert  Orientation Level Oriented X4  Respiratory  Respiratory Pattern Regular  Chest Assessment Chest expansion symmetrical  Bilateral Breath Sounds Diminished;Clear  Cardiac  Pulse Regular  Heart Sounds S1, S2  ECG Monitor Yes  Vascular  R Radial Pulse +2  L Radial Pulse +2  Edema Left lower extremity;Right lower extremity;Generalized  Generalized Edema +1  RLE Edema +1  LLE Edema +1  Integumentary  Integumentary (WDL) X  Skin Color Appropriate for ethnicity  Skin Condition Dry  Skin Integrity Surgical Incision (see LDA)  Musculoskeletal  Musculoskeletal (WDL) X  Generalized Weakness Yes  GU Assessment  Genitourinary (WDL) X  Genitourinary Symptoms External catheter  Psychosocial  Psychosocial (WDL) WDL

## 2018-11-24 NOTE — Care Management Important Message (Signed)
Copy of signed Medicare IM left in patient's room (out for dialysis).

## 2018-11-24 NOTE — Progress Notes (Signed)
HD Tx Start    11/24/18 1000  Vital Signs  Pulse Rate 82  Resp (!) 25  BP 130/90  Oxygen Therapy  SpO2 99 %  During Hemodialysis Assessment  Blood Flow Rate (mL/min) 400 mL/min  Arterial Pressure (mmHg) -200 mmHg  Venous Pressure (mmHg) 180 mmHg  Transmembrane Pressure (mmHg) 50 mmHg  Ultrafiltration Rate (mL/min) 720 mL/min  Dialysate Flow Rate (mL/min) 800 ml/min  Conductivity: Machine  14  HD Safety Checks Performed Yes  Dialysis Fluid Bolus Normal Saline  Bolus Amount (mL) 250 mL  Intra-Hemodialysis Comments Tx initiated  Hemodialysis Catheter Right Subclavian Double-lumen  No Placement Date or Time found.   Placed prior to admission: Yes  Orientation: Right  Access Location: Subclavian  Hemodialysis Catheter Type: Double-lumen  Site Condition No complications  Blue Lumen Status Infusing  Red Lumen Status Infusing

## 2018-11-24 NOTE — Plan of Care (Signed)
  Problem: Safety: Goal: Ability to remain free from injury will improve Outcome: Progressing   

## 2018-11-25 LAB — GLUCOSE, CAPILLARY
Glucose-Capillary: 112 mg/dL — ABNORMAL HIGH (ref 70–99)
Glucose-Capillary: 211 mg/dL — ABNORMAL HIGH (ref 70–99)
Glucose-Capillary: 159 mg/dL — ABNORMAL HIGH (ref 70–99)
Glucose-Capillary: 181 mg/dL — ABNORMAL HIGH (ref 70–99)

## 2018-11-25 NOTE — Progress Notes (Signed)
[facility]  ROUNDING NOTE   Subjective:   Family at bedside. Patient very weak this morning and could barely sit at end of the bed.   Hemodialysis treatment yesterday. Tolerated treatment well. UF of 2.5 liters. RIJ permcath - required cathflo. AVF not functioning.   Objective:  Vital signs in last 24 hours:  Temp:  [98 F (36.7 C)-98.8 F (37.1 C)] 98.4 F (36.9 C) (02/29 0735) Pulse Rate:  [68-92] 79 (02/29 0735) Resp:  [16-24] 16 (02/29 0735) BP: (120-136)/(69-100) 123/69 (02/29 0735) SpO2:  [97 %-100 %] 100 % (02/29 0735) Weight:  [113.1 kg] 113.1 kg (02/29 0346)  Weight change: -0.6 kg Filed Weights   11/23/18 1550 11/24/18 0945 11/25/18 0346  Weight: 115 kg 115 kg 113.1 kg    Intake/Output: I/O last 3 completed shifts: In: 120 [P.O.:120] Out: 2900 [Urine:400; Other:2500]   Intake/Output this shift:  No intake/output data recorded.  Physical Exam: General: NAD,   Head: Normocephalic, atraumatic. Moist oral mucosal membranes  Eyes: Anicteric, PERRL  Neck: Supple, trachea midline  Lungs:  Clear to auscultation  Heart: Regular rate and rhythm  Abdomen:  Soft, nontender,   Extremities: No peripheral edema.  Neurologic: Nonfocal, moving all four extremities  Skin: Left heel with pressure sore  Access: RIJ permcath, left AVF no thrill or bruit    Basic Metabolic Panel: Recent Labs  Lab 11/19/18 1828 11/04/2018 0531 11/23/18 1332 11/24/18 1204  NA  --  135  --   --   K  --  3.8  --   --   CL  --  97*  --   --   CO2  --  28  --   --   GLUCOSE  --  93  --   --   BUN  --  41*  --   --   CREATININE  --  4.51*  --   --   CALCIUM  --  8.9  --   --   MG 2.0 2.2  --   --   PHOS  --   --  5.2* 4.7*    Liver Function Tests: No results for input(s): AST, ALT, ALKPHOS, BILITOT, PROT, ALBUMIN in the last 168 hours. No results for input(s): LIPASE, AMYLASE in the last 168 hours. No results for input(s): AMMONIA in the last 168 hours.  CBC: Recent  Labs  Lab 11/20/18 0545 11/23/18 0503  WBC QUESTIONABLE IDENTIFICATION / INCORRECTLY LABELED SPECIMEN 12.2*  HGB QUESTIONABLE IDENTIFICATION / INCORRECTLY LABELED SPECIMEN 10.9*  HCT QUESTIONABLE IDENTIFICATION / INCORRECTLY LABELED SPECIMEN 33.7*  MCV QUESTIONABLE IDENTIFICATION / INCORRECTLY LABELED SPECIMEN 89.4  PLT QUESTIONABLE IDENTIFICATION / INCORRECTLY LABELED SPECIMEN 153    Cardiac Enzymes: No results for input(s): CKTOTAL, CKMB, CKMBINDEX, TROPONINI in the last 168 hours.  BNP: Invalid input(s): POCBNP  CBG: Recent Labs  Lab 11/24/18 0751 11/24/18 1716 11/24/18 2128 11/25/18 0737 11/25/18 1117  GLUCAP 128* 146* 141* 112* 1*    Microbiology: Results for orders placed or performed during the hospital encounter of 11/20/2018  Culture, blood (Routine X 2) w Reflex to ID Panel     Status: None   Collection Time: 11/09/2018  8:13 PM  Result Value Ref Range Status   Specimen Description BLOOD RIGHT FATTY CASTS  Final   Special Requests   Final    BOTTLES DRAWN AEROBIC AND ANAEROBIC Blood Culture adequate volume   Culture   Final    NO GROWTH 5 DAYS Performed at [facility],  [place], [place] [address]    Report Status 11/23/2018 FINAL  Final  MRSA PCR Screening     Status: None   Collection Time: 11/18/18  3:36 AM  Result Value Ref Range Status   MRSA by PCR NEGATIVE NEGATIVE Final    Comment:        The GeneXpert MRSA Assay (FDA approved for NASAL specimens only), is one component of a comprehensive MRSA colonization surveillance program. It is not intended to diagnose MRSA infection nor to guide or monitor treatment for MRSA infections. Performed at [facility], [address] [place], [place] [address]   Culture, blood (Routine X 2) w Reflex to ID Panel     Status: None   Collection Time: 11/18/18  3:55 AM  Result Value Ref Range Status   Specimen Description BLOOD RIGHT HAND  Final   Special Requests   Final     BOTTLES DRAWN AEROBIC AND ANAEROBIC Blood Culture adequate volume   Culture   Final    NO GROWTH 5 DAYS Performed at [facility], [address], [place], [place] [address]    Report Status 11/23/2018 FINAL  Final    Coagulation Studies: No results for input(s): LABPROT, INR in the last 72 hours.  Urinalysis: No results for input(s): COLORURINE, LABSPEC, PHURINE, GLUCOSEU, HGBUR, BILIRUBINUR, KETONESUR, PROTEINUR, UROBILINOGEN, NITRITE, LEUKOCYTESUR in the last 72 hours.  Invalid input(s): APPERANCEUR    Imaging: No results found.   Medications:   . alteplase (TPA-ACTIVASE) *DIALYSIS CATH* 5 mg infusion    . alteplase (TPA-ACTIVASE) *DIALYSIS CATH* 5 mg infusion     . amiodarone  200 mg Oral BID  . Chlorhexidine Gluconate Cloth  6 each Topical Q0600  . docusate sodium  100 mg Oral BID  . feeding supplement (NEPRO CARB STEADY)  237 mL Oral BID BM  . fluticasone  1 spray Each Nare Daily  . heparin  5,000 Units Subcutaneous Q8H  . insulin aspart  0-5 Units Subcutaneous QHS  . insulin aspart  0-9 Units Subcutaneous TID WC  . midodrine  5 mg Oral TID WC  . mometasone-formoterol  2 puff Inhalation BID  . multivitamin  1 tablet Oral QHS  . pantoprazole  20 mg Oral Daily   acetaminophen **OR** acetaminophen, ALPRAZolam, alum & mag hydroxide-simeth, bisacodyl, butalbital-acetaminophen-caffeine, calcium carbonate, loratadine, [DISCONTINUED] ondansetron **OR** ondansetron (ZOFRAN) IV, senna, simethicone  Assessment/ Plan:  [name] is a 48 y.o. black female with end-stage renal disease started hemodialysis January 2020, hypertension, Down syndrome, morbid obesity, obstructive sleep apnea, not using CPAP due to claustrophobia now admitted for hypercarbic acute respiratory failure and generalized weakness  ESRD/MWF/[address] [facility] dialysis/[facility]/111 kg/ 240 min  1. End-stage renal disease: RIJ permcath. Complication of hemodialysis access: nonfunctioning AVF.  permcath required cathflo.  - Next treatment for Monday.  - Appreciate vascular input.   2. Hypotension:  -Continue midodrine 5 mg p.o. 3 times daily.  3. Anemia with chronic kidney disease: hemoglobin 10.9. Normocytic. EPO as outpatient.   4. Secondary Hyperparathyroidism: Calcium and phosphorus at goal.  - calcium carbonate.    LOS: 7 Michele Bartlett 2/29/202012:27 PM

## 2018-11-25 NOTE — Progress Notes (Signed)
Michele Bartlett at [place] NAME: Michele Bartlett    MR#:  [id number]  DATE OF BIRTH:  1970/12/08  SUBJECTIVE:  patient doing well. She said she slept well. She used CPAP yesterday. Father in the room. Patient started work with physical therapy. As any new complaints. REVIEW OF SYSTEMS:  CONSTITUTIONAL: No fever, fatigue or weakness.  EYES: No blurred or double vision.  EARS, NOSE, AND THROAT: No tinnitus or ear pain.  RESPIRATORY: No cough, shortness of breath, wheezing or hemoptysis.  CARDIOVASCULAR: No chest pain, orthopnea, edema.  GASTROINTESTINAL: No nausea, vomiting, diarrhea or abdominal pain.  GENITOURINARY: No dysuria, hematuria.  ENDOCRINE: No polyuria, nocturia,  HEMATOLOGY: No anemia, easy bruising or bleeding SKIN: No rash or lesion. MUSCULOSKELETAL: No joint pain or arthritis.   NEUROLOGIC: No tingling, numbness, weakness.  PSYCHIATRY: No anxiety or depression.   DRUG ALLERGIES:   Allergies  Allergen Reactions  . Novocain [Procaine] Rash    Skin peel raw all over    VITALS:  Blood pressure 123/69, pulse 79, temperature 98.4 F (36.9 C), temperature source Oral, resp. rate 16, height 5\' 1"  (1.549 m), weight 113.1 kg, last menstrual period 11/02/2018, SpO2 100 %.  PHYSICAL EXAMINATION:  GENERAL:  48 y.o.-year-old patient lying in the bed with no acute distress. Morbidly obese EYES: Pupils equal, round, reactive to light and accommodation. No scleral icterus. Extraocular muscles intact.  HEENT: Head atraumatic, normocephalic. Oropharynx and nasopharynx clear.  NECK:  Supple, no jugular venous distention. No thyroid enlargement, no tenderness.  LUNGS: Normal breath sounds bilaterally, no wheezing, rales,rhonchi or crepitation. No use of accessory muscles of respiration.  CARDIOVASCULAR: S1, S2 normal. No murmurs, rubs, or gallops.  ABDOMEN: Soft, nontender, nondistended. Bowel sounds present.  EXTREMITIES: No pedal  edema, cyanosis, or clubbing.  NEUROLOGIC: Awake, alert and oriented x3. Sensation intact. Gait not checked.  PSYCHIATRIC: The patient is alert and oriented x 2.  SKIN: No obvious rash, lesion, or ulcer.    LABORATORY PANEL:   CBC Recent Labs  Lab 11/23/18 0503  WBC 12.2*  HGB 10.9*  HCT 33.7*  PLT 153   ------------------------------------------------------------------------------------------------------------------  Chemistries  Recent Labs  Lab 11/07/2018 0531  NA 135  K 3.8  CL 97*  CO2 28  GLUCOSE 93  BUN 41*  CREATININE 4.51*  CALCIUM 8.9  MG 2.2   ------------------------------------------------------------------------------------------------------------------  Cardiac Enzymes No results for input(s): TROPONINI in the last 168 hours. ------------------------------------------------------------------------------------------------------------------  RADIOLOGY:  No results found.  EKG:   Orders placed or performed during the hospital encounter of 11/01/2018  . ED EKG  . ED EKG  . EKG 12-Lead  . EKG 12-Lead    ASSESSMENT AND PLAN:   #Acute hypoxic respiratory failure and hypercapnia -secondary to noncompliance with the CPAP off BiPAP Clinically improving-- tolerating CPAP well Reinforced the importance of being compliant with CPAP.  Patient is tolerating CPAP with facial mask .patient can try nasal mask with chinstrap if she could not tolerate facemask F/u  with [facility] pulmonology dr.fortin on February 25, which is rescheduled now Requiring 2 L of oxygen via nasal cannula continuous at this time Okay to discharge patient from pulmonology standpoint at [facility]  #Nonsustained V. Tach-recurrent Spontaneously resolving Electrolytes are in normal range Cardiology consult placed to Dr. Clayborn Bigness has seen the patient, recommending to repeat echocardiogram Amiodarone started on 11/21/2018 as patient had another episode of nonsustained V. Tach  #Orthostatic  hypotension Patient is a dialysis patient   midodrine dose  increased to 5 mg 3 times daily.  Dizziness resolved  #End-stage renal disease on hemodialysis-nephrology is following during the hospital course Patient will get hemodialysis after declotting of the permacath by Dr. Virl Diamond was canceled as patient became apneic after anesthesia -patient's HD Access functional  #Chronic right-sided congestive heart failure No exacerbation at this time  #Essential hypertension not on any medications Blood pressure is okay becoming orthostatic  #Obstructive sleep apnea continue CPAP nightly  #Constipation -laxatives and stool softeners.  Patient will get enema today after procedure  #Generalized weakness PT consult -recommending skilled nursing facility They will reassess the patient tomorrow  Discussed with patient's father and patient will discharged to rehab once level to pass out is obtained CODE STATUS: fc   TOTAL TIME TAKING CARE OF THIS PATIENT: 32  minutes.   POSSIBLE D/C IN 1-2  DAYS, DEPENDING ON CLINICAL CONDITION.  Note: This dictation was prepared with Dragon dictation along with smaller phrase technology. Any transcriptional errors that result from this process are unintentional.   [name] M.D on 11/25/2018 at 2:16 PM  Between 7am to 6pm - Pager - [phone] After 6pm go to [email or web address] - password [email or web address] [facility]  [facility]  Office  [phone]  CC: Primary care physician; Lavera Guise, MD

## 2018-11-25 NOTE — Progress Notes (Signed)
Physical Therapy Treatment Patient Details Name: Michele Bartlett MRN: [id number] DOB: 1971/05/20 Today's Date: 11/25/2018    History of Present Illness 48 y.o. female who presents with chief complaint of weakness, arrived obtunded and hypotensive and hypoxic.  She recently developed end-stage renal disease and was started on dialysis last month, recent AV fistula placement and required general anesthesia with intubation.  Subsequently her mother states that she had a difficult time recovering from the anesthesia.  She missed a dialysis session on Wednesday, and instead had a done on Thursday.  Pt for fistulogram 2/26/ which was cancelled as pt became apneic.  Pt s/p declotting of permcath on 2/27.  Per Dr. Margaretmary Eddy on 2/28 prior to PT session, no activity restrictions, bed rest with bathroom privileges no longer applicable.     PT Comments    Pt limited this session due to DBP. Pt on 2L O2 throughout session, SpO2 remained at or above 94%. In supine BP 123/87. Min guard for sup>sit, BP 134/133. Sitting at EOB for 3 min BP 150/105. Further mobility held and SPT mod A BLE assisted to supine. BP in supine 117/81. Pt asymptomatic throughout session. Current plan remains appropriate.      Follow Up Recommendations  SNF;Supervision/Assistance - 24 hour     Equipment Recommendations  Other (comment)(bariatric walker)    Recommendations for Other Services       Precautions / Restrictions Precautions Precautions: Fall Precaution Comments: monitor O2, HR, BP (+orthostatic, asymptomatic) Restrictions Weight Bearing Restrictions: No    Mobility  Bed Mobility Overal bed mobility: Needs Assistance Bed Mobility: Supine to Sit;Sit to Supine     Supine to sit: Min guard;HOB elevated Sit to supine: Mod assist   General bed mobility comments: Min gaurd for sup>sit with increased effort and time. Mod A for BLE from sit>sup.   Transfers Overall transfer level: Needs assistance Equipment used:  None Transfers: Lateral/Scoot Transfers          Lateral/Scoot Transfers: Min guard General transfer comment: Unable to attempt STS this session as DBP too high. Pt able to perform lateral scoot towards [facility] with increased time and effort.   Ambulation/Gait             General Gait Details: not safe to attempt at this date.   Stairs             Wheelchair Mobility    Modified Rankin (Stroke Patients Only)       Balance Overall balance assessment: Needs assistance Sitting-balance support: No upper extremity supported;Feet supported Sitting balance-Leahy Scale: Good Sitting balance - Comments: Pt able to sit EOB without UE support and reach forward outside of BOS without LOB                                    Cognition Arousal/Alertness: Awake/alert Behavior During Therapy: WFL for tasks assessed/performed Overall Cognitive Status: History of cognitive impairments - at baseline                                 General Comments: Pt with h/o down's syndrome.  Appropriate throughout session, noted some delay with processing.       Exercises Other Exercises Other Exercises: Verbal cues for pursed lip breathing in sitting for 1 min as pt appeared SOB despite SpO2 at or above 94%. Other Exercises: Pt maintained sitting balance  for 3 min without UE support.     General Comments General comments (skin integrity, edema, etc.): Pt on 2L O2 througout, SpO2 remained at or above 94% throughout session. BP in supine 123/87, sitting at EOB 134/113, sitting at EOB for 3 min 150/105, supine following activity 117/81. HR responded to activity appropriately at rest 82 and with activity up 95. Pt asymptomatic throughout. Further mobility held due to DBP.       Pertinent Vitals/Pain Pain Assessment: No/denies pain(currently not in pain but has had pain today)    Home Living                      Prior Function            PT Goals  (current goals can now be found in the care plan section) Acute Rehab PT Goals Patient Stated Goal: to walk PT Goal Formulation: With patient Time For Goal Achievement: 12/05/18 Potential to Achieve Goals: Fair Progress towards PT goals: Not progressing toward goals - comment(BP)    Frequency    Min 2X/week      PT Plan Current plan remains appropriate    Co-evaluation              AM-PAC PT "6 Clicks" Mobility   Outcome Measure  Help needed turning from your back to your side while in a flat bed without using bedrails?: A Little Help needed moving from lying on your back to sitting on the side of a flat bed without using bedrails?: A Little Help needed moving to and from a bed to a chair (including a wheelchair)?: A Lot Help needed standing up from a chair using your arms (e.g., wheelchair or bedside chair)?: A Lot Help needed to walk in hospital room?: A Lot Help needed climbing 3-5 steps with a railing? : Total 6 Click Score: 13    End of Session Equipment Utilized During Treatment: Gait belt;Oxygen Activity Tolerance: Treatment limited secondary to medical complications (Comment)(Increased DBP ) Patient left: in bed;with call bell/phone within reach;with bed alarm set;with family/visitor present;Other (comment)(ice pack-to prevent pain on L lateral ankle from sore) Nurse Communication: Mobility status;Other (comment)(BP) PT Visit Diagnosis: Muscle weakness (generalized) (M62.81);Unsteadiness on feet (R26.81);Difficulty in walking, not elsewhere classified (R26.2)     Time: 1683-7290 PT Time Calculation (min) (ACUTE ONLY): 26 min  Charges:                        Dorothy Spark, SPT  11/25/2018, 2:00 PM

## 2018-11-25 NOTE — Clinical Social Work Note (Signed)
CSW presented bed offers to patient and parents at bedside. Patient and family have accepted bed offer from [facility]. CSW notified Claiborne Billings at [facility] of bed acceptance. Claiborne Billings will begin [facility]. CSW will continue to follow for discharge planning.   [place], [place]

## 2018-11-25 NOTE — Progress Notes (Signed)
Pt have an order to transfer to [facility]. Family made aware of the transfer. Call report and talked to [facility].

## 2018-11-25 NOTE — Plan of Care (Signed)
    Problem: Clinical Measurements: Goal: Respiratory complications will improve Outcome: Progressing   Problem: Safety: Goal: Ability to remain free from injury will improve 11/25/2018 0336 by Liliane Channel, RN Outcome: Progressing 11/25/2018 0235 by Liliane Channel, RN Outcome: Progressing

## 2018-11-26 LAB — GLUCOSE, CAPILLARY
GLUCOSE-CAPILLARY: 162 mg/dL — AB (ref 70–99)
Glucose-Capillary: 114 mg/dL — ABNORMAL HIGH (ref 70–99)
Glucose-Capillary: 152 mg/dL — ABNORMAL HIGH (ref 70–99)
Glucose-Capillary: 127 mg/dL — ABNORMAL HIGH (ref 70–99)

## 2018-11-26 NOTE — Progress Notes (Signed)
[place] at [place] NAME: Michele Bartlett    MR#:  [id number]  DATE OF BIRTH:  Dec 11, 1970  SUBJECTIVE:  patient doing well. She said she slept well. She used CPAP yesterday.mom in the room. Patient started work with physical therapy. Has mild HA REVIEW OF SYSTEMS:  CONSTITUTIONAL: No fever, fatigue or weakness.  EYES: No blurred or double vision.  EARS, NOSE, AND THROAT: No tinnitus or ear pain.  RESPIRATORY: No cough, shortness of breath, wheezing or hemoptysis.  CARDIOVASCULAR: No chest pain, orthopnea, edema.  GASTROINTESTINAL: No nausea, vomiting, diarrhea or abdominal pain.  GENITOURINARY: No dysuria, hematuria.  ENDOCRINE: No polyuria, nocturia,  HEMATOLOGY: No anemia, easy bruising or bleeding SKIN: No rash or lesion. MUSCULOSKELETAL: No joint pain or arthritis.   NEUROLOGIC: No tingling, numbness, weakness.  PSYCHIATRY: No anxiety or depression.   DRUG ALLERGIES:   Allergies  Allergen Reactions  . Novocain [Procaine] Rash    Skin peel raw all over    VITALS:  Blood pressure 129/75, pulse 80, temperature 98.4 F (36.9 C), temperature source Oral, resp. rate 20, height 5\' 1"  (1.549 m), weight 113.1 kg, last menstrual period 11/02/2018, SpO2 100 %.  PHYSICAL EXAMINATION:  GENERAL:  48 y.o.-year-old patient lying in the bed with no acute distress. Morbidly obese EYES: Pupils equal, round, reactive to light and accommodation. No scleral icterus. Extraocular muscles intact.  HEENT: Head atraumatic, normocephalic. Oropharynx and nasopharynx clear.  NECK:  Supple, no jugular venous distention. No thyroid enlargement, no tenderness.  LUNGS: Normal breath sounds bilaterally, no wheezing, rales,rhonchi or crepitation. No use of accessory muscles of respiration.  CARDIOVASCULAR: S1, S2 normal. No murmurs, rubs, or gallops.  ABDOMEN: Soft, nontender, nondistended. Bowel sounds present.  EXTREMITIES: No pedal edema, cyanosis, or  clubbing.  NEUROLOGIC: Awake, alert and oriented x3. Sensation intact. Gait not checked.  PSYCHIATRIC: The patient is alert and oriented x 2.  SKIN: No obvious rash, lesion, or ulcer.    LABORATORY PANEL:   CBC Recent Labs  Lab 11/23/18 0503  WBC 12.2*  HGB 10.9*  HCT 33.7*  PLT 153   ------------------------------------------------------------------------------------------------------------------  Chemistries  Recent Labs  Lab 11/11/2018 0531  NA 135  K 3.8  CL 97*  CO2 28  GLUCOSE 93  BUN 41*  CREATININE 4.51*  CALCIUM 8.9  MG 2.2   ------------------------------------------------------------------------------------------------------------------  Cardiac Enzymes No results for input(s): TROPONINI in the last 168 hours. ------------------------------------------------------------------------------------------------------------------  RADIOLOGY:  No results found.  EKG:   Orders placed or performed during the hospital encounter of 11/18/2018  . ED EKG  . ED EKG  . EKG 12-Lead  . EKG 12-Lead    ASSESSMENT AND PLAN:   #Acute hypoxic respiratory failure and hypercapnia -secondary to noncompliance with the CPAP off BiPAP Clinically improving-- tolerating CPAP well Reinforced the importance of being compliant with CPAP.  Patient is tolerating CPAP with facial mask .patient can try nasal mask with chinstrap if she could not tolerate facemask F/u  with [facility] pulmonology dr.fortin on February 25, which is rescheduled now Requiring 2 L of oxygen via nasal cannula continuous at this time Okay to discharge patient from pulmonology standpoint at [facility]  #Nonsustained V. Tach-recurrent Spontaneously resolving Electrolytes are in normal range Cardiology consult placed to Dr. Clayborn Bigness has seen the patient, recommending to repeat echocardiogram Amiodarone started on 11/21/2018 as patient had another episode of nonsustained V. Tach  #Orthostatic hypotension Patient is a  dialysis patient   midodrine dose increased to  5 mg 3 times daily.  Dizziness resolved  #End-stage renal disease on hemodialysis-nephrology is following during the hospital course Patient will get hemodialysis after declotting of the permacath by Dr. Virl Diamond was canceled as patient became apneic after anesthesia -patient's HD Access functional  #Chronic right-sided congestive heart failure No exacerbation at this time  #Essential hypertension not on any medications Blood pressure is okay becoming orthostatic  #Obstructive sleep apnea continue CPAP nightly  #Constipation -laxatives and stool softeners.  Patient will get enema today after procedure  #Generalized weakness PT consult -recommending skilled nursing facility  [facility] bed accepted. Awaiting PASSAR for pt. She is best at baseline and ready for discharge  Discussed with patient's mother and patient will discharged to rehab once level to passar is obtained CODE STATUS: fc   TOTAL TIME TAKING CARE OF THIS PATIENT: 32  minutes.   POSSIBLE D/C IN 1-2  DAYS, DEPENDING ON CLINICAL CONDITION.  Note: This dictation was prepared with Dragon dictation along with smaller phrase technology. Any transcriptional errors that result from this process are unintentional.   [name] M.D on 11/26/2018 at 9:56 AM  Between 7am to 6pm - Pager - [phone] After 6pm go to [email or web address] - password [email or web address] [facility]  [facility]  Office  [phone]  CC: Primary care physician; Lavera Guise, MD

## 2018-11-26 NOTE — Progress Notes (Signed)
[facility]  ROUNDING NOTE   Subjective:   Family at bedside.    Objective:  Vital signs in last 24 hours:  Temp:  [98.1 F (36.7 C)-98.4 F (36.9 C)] 98.1 F (36.7 C) (03/01 1222) Pulse Rate:  [76-88] 88 (03/01 1222) Resp:  [20] 20 (03/01 1222) BP: (120-129)/(72-90) 128/90 (03/01 1222) SpO2:  [99 %-100 %] 100 % (03/01 1222)  Weight change:  Filed Weights   11/23/18 1550 11/24/18 0945 11/25/18 0346  Weight: 115 kg 115 kg 113.1 kg    Intake/Output: I/O last 3 completed shifts: In: 120 [P.O.:120] Out: 100 [Urine:100]   Intake/Output this shift:  No intake/output data recorded.  Physical Exam: General: NAD, laying in bed  Head: Normocephalic, atraumatic. Moist oral mucosal membranes  Eyes: Anicteric, PERRL  Neck: Supple, trachea midline  Lungs:  Clear to auscultation  Heart: Regular rate and rhythm  Abdomen:  Soft, nontender,   Extremities: No peripheral edema.  Neurologic: Nonfocal, moving all four extremities  Skin: Left heel with pressure sore  Access: RIJ permcath, left AVF no thrill or bruit    Basic Metabolic Panel: Recent Labs  Lab 11/19/18 1828 11/01/2018 0531 11/23/18 1332 11/24/18 1204  NA  --  135  --   --   K  --  3.8  --   --   CL  --  97*  --   --   CO2  --  28  --   --   GLUCOSE  --  93  --   --   BUN  --  41*  --   --   CREATININE  --  4.51*  --   --   CALCIUM  --  8.9  --   --   MG 2.0 2.2  --   --   PHOS  --   --  5.2* 4.7*    Liver Function Tests: No results for input(s): AST, ALT, ALKPHOS, BILITOT, PROT, ALBUMIN in the last 168 hours. No results for input(s): LIPASE, AMYLASE in the last 168 hours. No results for input(s): AMMONIA in the last 168 hours.  CBC: Recent Labs  Lab 11/20/18 0545 11/23/18 0503  WBC QUESTIONABLE IDENTIFICATION / INCORRECTLY LABELED SPECIMEN 12.2*  HGB QUESTIONABLE IDENTIFICATION / INCORRECTLY LABELED SPECIMEN 10.9*  HCT QUESTIONABLE IDENTIFICATION / INCORRECTLY LABELED SPECIMEN 33.7*   MCV QUESTIONABLE IDENTIFICATION / INCORRECTLY LABELED SPECIMEN 89.4  PLT QUESTIONABLE IDENTIFICATION / INCORRECTLY LABELED SPECIMEN 153    Cardiac Enzymes: No results for input(s): CKTOTAL, CKMB, CKMBINDEX, TROPONINI in the last 168 hours.  BNP: Invalid input(s): POCBNP  CBG: Recent Labs  Lab 11/25/18 1117 11/25/18 1624 11/25/18 2059 11/26/18 0737 11/26/18 1151  GLUCAP 211* 159* 181* 114* 152*    Microbiology: Results for orders placed or performed during the hospital encounter of 11/23/2018  Culture, blood (Routine X 2) w Reflex to ID Panel     Status: None   Collection Time: 11/04/2018  8:13 PM  Result Value Ref Range Status   Specimen Description BLOOD RIGHT FATTY CASTS  Final   Special Requests   Final    BOTTLES DRAWN AEROBIC AND ANAEROBIC Blood Culture adequate volume   Culture   Final    NO GROWTH 5 DAYS Performed at [facility], [address] [place], [place] [address]    Report Status 11/23/2018 FINAL  Final  MRSA PCR Screening     Status: None   Collection Time: 11/18/18  3:36 AM  Result Value Ref Range Status  MRSA by PCR NEGATIVE NEGATIVE Final    Comment:        The GeneXpert MRSA Assay (FDA approved for NASAL specimens only), is one component of a comprehensive MRSA colonization surveillance program. It is not intended to diagnose MRSA infection nor to guide or monitor treatment for MRSA infections. Performed at [facility], [address] [place], [place] [address]   Culture, blood (Routine X 2) w Reflex to ID Panel     Status: None   Collection Time: 11/18/18  3:55 AM  Result Value Ref Range Status   Specimen Description BLOOD RIGHT HAND  Final   Special Requests   Final    BOTTLES DRAWN AEROBIC AND ANAEROBIC Blood Culture adequate volume   Culture   Final    NO GROWTH 5 DAYS Performed at [facility], [address] [place], [place] [address]    Report Status 11/23/2018 FINAL  Final    Coagulation  Studies: No results for input(s): LABPROT, INR in the last 72 hours.  Urinalysis: No results for input(s): COLORURINE, LABSPEC, PHURINE, GLUCOSEU, HGBUR, BILIRUBINUR, KETONESUR, PROTEINUR, UROBILINOGEN, NITRITE, LEUKOCYTESUR in the last 72 hours.  Invalid input(s): APPERANCEUR    Imaging: No results found.   Medications:   . alteplase (TPA-ACTIVASE) *DIALYSIS CATH* 5 mg infusion    . alteplase (TPA-ACTIVASE) *DIALYSIS CATH* 5 mg infusion     . amiodarone  200 mg Oral BID  . Chlorhexidine Gluconate Cloth  6 each Topical Q0600  . docusate sodium  100 mg Oral BID  . feeding supplement (NEPRO CARB STEADY)  237 mL Oral BID BM  . fluticasone  1 spray Each Nare Daily  . heparin  5,000 Units Subcutaneous Q8H  . insulin aspart  0-5 Units Subcutaneous QHS  . insulin aspart  0-9 Units Subcutaneous TID WC  . midodrine  5 mg Oral TID WC  . mometasone-formoterol  2 puff Inhalation BID  . multivitamin  1 tablet Oral QHS  . pantoprazole  20 mg Oral Daily   acetaminophen **OR** acetaminophen, ALPRAZolam, alum & mag hydroxide-simeth, bisacodyl, butalbital-acetaminophen-caffeine, calcium carbonate, loratadine, [DISCONTINUED] ondansetron **OR** ondansetron (ZOFRAN) IV, senna, simethicone  Assessment/ Plan:  Michele Bartlett is a 48 y.o. black female with end-stage renal disease started hemodialysis January 2020, hypertension, Down syndrome, morbid obesity, obstructive sleep apnea, not using CPAP due to claustrophobia now admitted for hypercarbic acute respiratory failure and generalized weakness  ESRD/MWF/[name] [facility] dialysis/[facility]/111 kg/ 240 min  1. End-stage renal disease: RIJ permcath. Complication of hemodialysis access: nonfunctioning AVF. permcath required cathflo.  - Next treatment for Monday.  - Appreciate vascular input.   2. Hypotension:  -Continue midodrine 5 mg p.o. 3 times daily.  3. Anemia with chronic kidney disease:  EPO as outpatient.   4. Secondary  Hyperparathyroidism: Calcium and phosphorus at goal.  - calcium carbonate.    LOS: 8 Arling Cerone 3/1/20202:34 PM

## 2018-11-26 DEATH — deceased

## 2018-11-27 LAB — GLUCOSE, CAPILLARY
Glucose-Capillary: 100 mg/dL — ABNORMAL HIGH (ref 70–99)
Glucose-Capillary: 146 mg/dL — ABNORMAL HIGH (ref 70–99)
Glucose-Capillary: 182 mg/dL — ABNORMAL HIGH (ref 70–99)
Glucose-Capillary: 150 mg/dL — ABNORMAL HIGH (ref 70–99)

## 2018-11-27 MED ORDER — ALTEPLASE 2 MG IJ SOLR
2.0000 mg | Freq: Once | INTRAMUSCULAR | Status: AC
  Administered 2018-11-27: 2 mg

## 2018-11-27 NOTE — Progress Notes (Signed)
[place] at [place] NAME: Michele Bartlett    MR#:  [id number]  DATE OF BIRTH:  1970/11/14  SUBJECTIVE:  patient seen at dialysis. She is upset because her dialysis was cut short due to perm cath not functioning well. Spoke with Dr. Juleen China REVIEW OF SYSTEMS:  CONSTITUTIONAL: No fever, fatigue or weakness.  EYES: No blurred or double vision.  EARS, NOSE, AND THROAT: No tinnitus or ear pain.  RESPIRATORY: No cough, shortness of breath, wheezing or hemoptysis.  CARDIOVASCULAR: No chest pain, orthopnea, edema.  GASTROINTESTINAL: No nausea, vomiting, diarrhea or abdominal pain.  GENITOURINARY: No dysuria, hematuria.  ENDOCRINE: No polyuria, nocturia,  HEMATOLOGY: No anemia, easy bruising or bleeding SKIN: No rash or lesion. MUSCULOSKELETAL: No joint pain or arthritis.   NEUROLOGIC: No tingling, numbness, weakness.  PSYCHIATRY: No anxiety or depression.   DRUG ALLERGIES:   Allergies  Allergen Reactions  . Novocain [Procaine] Rash    Skin peel raw all over    VITALS:  Blood pressure 133/85, pulse 68, temperature (!) 97.5 F (36.4 C), temperature source Oral, resp. rate 20, height 5\' 1"  (1.549 m), weight 113.1 kg, last menstrual period 11/02/2018, SpO2 100 %.  PHYSICAL EXAMINATION:  GENERAL:  48 y.o.-year-old patient lying in the bed with no acute distress. Morbidly obese EYES: Pupils equal, round, reactive to light and accommodation. No scleral icterus. Extraocular muscles intact.  HEENT: Head atraumatic, normocephalic. Oropharynx and nasopharynx clear.  NECK:  Supple, no jugular venous distention. No thyroid enlargement, no tenderness.  LUNGS: Normal breath sounds bilaterally, no wheezing, rales,rhonchi or crepitation. No use of accessory muscles of respiration. Perm Cath CARDIOVASCULAR: S1, S2 normal. No murmurs, rubs, or gallops.  ABDOMEN: Soft, nontender, nondistended. Bowel sounds present.  EXTREMITIES: No pedal edema,  cyanosis, or clubbing.  NEUROLOGIC: Awake, alert and oriented x3. Sensation intact. Gait not checked.  PSYCHIATRIC: The patient is alert and oriented x 2.  SKIN: No obvious rash, lesion, or ulcer.    LABORATORY PANEL:   CBC Recent Labs  Lab 11/23/18 0503  WBC 12.2*  HGB 10.9*  HCT 33.7*  PLT 153   ------------------------------------------------------------------------------------------------------------------  Chemistries  Recent Labs  Lab 11/04/2018 0531  NA 135  K 3.8  CL 97*  CO2 28  GLUCOSE 93  BUN 41*  CREATININE 4.51*  CALCIUM 8.9  MG 2.2   ------------------------------------------------------------------------------------------------------------------  Cardiac Enzymes No results for input(s): TROPONINI in the last 168 hours. ------------------------------------------------------------------------------------------------------------------  RADIOLOGY:  No results found.  EKG:   Orders placed or performed during the hospital encounter of 10/28/2018  . ED EKG  . ED EKG  . EKG 12-Lead  . EKG 12-Lead    ASSESSMENT AND PLAN:   #Acute hypoxic respiratory failure and hypercapnia -secondary to noncompliance with the CPAP off BiPAP Clinically improving-- tolerating CPAP well Reinforced the importance of being compliant with CPAP.  Patient is tolerating CPAP with facial mask .patient can try nasal mask with chinstrap if she could not tolerate facemask F/u  with [facility] pulmonology dr.fortin on February 25, which is rescheduled now Requiring 2 L of oxygen via nasal cannula continuous at this time Okay to discharge patient from pulmonology standpoint at [facility]  #Nonsustained V. Tach-recurrent Spontaneously resolving Electrolytes are in normal range Cardiology consult placed to Dr. Clayborn Bigness has seen the patient, recommending to repeat echocardiogram Amiodarone started on 11/21/2018 as patient had another episode of nonsustained V. Tach  #Orthostatic  hypotension Patient is a dialysis patient   midodrine dose  increased to 5 mg 3 times daily.  Dizziness resolved  #End-stage renal disease on hemodialysis-nephrology is following during the hospital course Patient will get hemodialysis after declotting of the permacath by Dr. Virl Diamond was canceled as patient became apneic after anesthesia -patient's HD Access (perm cath) non-functional. Dr Lucky Cowboy message sent  #Chronic right-sided congestive heart failure No exacerbation at this time  #Essential hypertension not on any medications Blood pressure is okay becoming orthostatic  #Obstructive sleep apnea continue CPAP nightly  #Constipation -laxatives and stool softeners.  Patient will get enema today after procedure  #Generalized weakness PT consult -recommending skilled nursing facility  [facility] bed accepted. Awaiting [facility] for pt. She is best at baseline and ready for discharge once HD access is established  Discussed with patient's mother and patient will discharged to rehab once level to passar is obtained CODE STATUS: fc   TOTAL TIME TAKING CARE OF THIS PATIENT: 32  minutes.   POSSIBLE D/C IN 1-2  DAYS, DEPENDING ON CLINICAL CONDITION.  Note: This dictation was prepared with Dragon dictation along with smaller phrase technology. Any transcriptional errors that result from this process are unintentional.   [name] M.D on 11/27/2018 at 12:37 PM  Between 7am to 6pm - Pager - [phone] After 6pm go to [email or web address] - password [email or web address] [facility]  [facility]  Office  [phone]  CC: Primary care physician; Lavera Guise, MD

## 2018-11-27 NOTE — Care Management Important Message (Signed)
Copy of signed Medicare IM left in patient's room (out for dialysis).

## 2018-11-27 NOTE — Progress Notes (Signed)
[facility]  ROUNDING NOTE   Subjective:   Unable to get dialysis today. RIJ permcath not functioning. TPA was dwelled for one hour with no improvement.   Patient seated in chair for entire time  Objective:  Vital signs in last 24 hours:  Temp:  [97.5 F (36.4 C)-98.6 F (37 C)] 97.5 F (36.4 C) (03/02 0553) Pulse Rate:  [68-78] 68 (03/02 0553) Resp:  [18-20] 20 (03/02 0553) BP: (121-133)/(85-87) 133/85 (03/02 0553) SpO2:  [100 %] 100 % (03/02 0553)  Weight change:  Filed Weights   11/23/18 1550 11/24/18 0945 11/25/18 0346  Weight: 115 kg 115 kg 113.1 kg    Intake/Output: I/O last 3 completed shifts: In: -  Out: 700 [Urine:700]   Intake/Output this shift:  No intake/output data recorded.  Physical Exam: General: NAD, laying in bed  Head: Normocephalic, atraumatic. Moist oral mucosal membranes  Eyes: Anicteric, PERRL  Neck: Supple, trachea midline  Lungs:  Clear to auscultation  Heart: Regular rate and rhythm  Abdomen:  Soft, nontender,   Extremities: No peripheral edema.  Neurologic: Nonfocal, moving all four extremities  Skin: Left heel with pressure sore  Access: RIJ permcath, left AVF no thrill or bruit    Basic Metabolic Panel: Recent Labs  Lab 10/31/2018 0531 11/23/18 1332 11/24/18 1204  NA 135  --   --   K 3.8  --   --   CL 97*  --   --   CO2 28  --   --   GLUCOSE 93  --   --   BUN 41*  --   --   CREATININE 4.51*  --   --   CALCIUM 8.9  --   --   MG 2.2  --   --   PHOS  --  5.2* 4.7*    Liver Function Tests: No results for input(s): AST, ALT, ALKPHOS, BILITOT, PROT, ALBUMIN in the last 168 hours. No results for input(s): LIPASE, AMYLASE in the last 168 hours. No results for input(s): AMMONIA in the last 168 hours.  CBC: Recent Labs  Lab 11/23/18 0503  WBC 12.2*  HGB 10.9*  HCT 33.7*  MCV 89.4  PLT 153    Cardiac Enzymes: No results for input(s): CKTOTAL, CKMB, CKMBINDEX, TROPONINI in the last 168  hours.  BNP: Invalid input(s): POCBNP  CBG: Recent Labs  Lab 11/26/18 1151 11/26/18 1710 11/26/18 2111 11/27/18 0730 11/27/18 1251  GLUCAP 152* 162* 127* 100* 146*    Microbiology: Results for orders placed or performed during the hospital encounter of 11/03/2018  Culture, blood (Routine X 2) w Reflex to ID Panel     Status: None   Collection Time: 11/01/2018  8:13 PM  Result Value Ref Range Status   Specimen Description BLOOD RIGHT FATTY CASTS  Final   Special Requests   Final    BOTTLES DRAWN AEROBIC AND ANAEROBIC Blood Culture adequate volume   Culture   Final    NO GROWTH 5 DAYS Performed at [facility], [address] [place], [place] [address]    Report Status 11/23/2018 FINAL  Final  MRSA PCR Screening     Status: None   Collection Time: 11/18/18  3:36 AM  Result Value Ref Range Status   MRSA by PCR NEGATIVE NEGATIVE Final    Comment:        The GeneXpert MRSA Assay (FDA approved for NASAL specimens only), is one component of a comprehensive MRSA colonization surveillance program. It is not  intended to diagnose MRSA infection nor to guide or monitor treatment for MRSA infections. Performed at [facility], [address] [place], [place] [address]   Culture, blood (Routine X 2) w Reflex to ID Panel     Status: None   Collection Time: 11/18/18  3:55 AM  Result Value Ref Range Status   Specimen Description BLOOD RIGHT HAND  Final   Special Requests   Final    BOTTLES DRAWN AEROBIC AND ANAEROBIC Blood Culture adequate volume   Culture   Final    NO GROWTH 5 DAYS Performed at [facility], [address] [place], [place] [address]    Report Status 11/23/2018 FINAL  Final    Coagulation Studies: No results for input(s): LABPROT, INR in the last 72 hours.  Urinalysis: No results for input(s): COLORURINE, LABSPEC, PHURINE, GLUCOSEU, HGBUR, BILIRUBINUR, KETONESUR, PROTEINUR, UROBILINOGEN, NITRITE, LEUKOCYTESUR in the last 72  hours.  Invalid input(s): APPERANCEUR    Imaging: No results found.   Medications:   . alteplase (TPA-ACTIVASE) *DIALYSIS CATH* 5 mg infusion    . alteplase (TPA-ACTIVASE) *DIALYSIS CATH* 5 mg infusion     . amiodarone  200 mg Oral BID  . Chlorhexidine Gluconate Cloth  6 each Topical Q0600  . docusate sodium  100 mg Oral BID  . feeding supplement (NEPRO CARB STEADY)  237 mL Oral BID BM  . fluticasone  1 spray Each Nare Daily  . heparin  5,000 Units Subcutaneous Q8H  . insulin aspart  0-5 Units Subcutaneous QHS  . insulin aspart  0-9 Units Subcutaneous TID WC  . midodrine  5 mg Oral TID WC  . mometasone-formoterol  2 puff Inhalation BID  . multivitamin  1 tablet Oral QHS  . pantoprazole  20 mg Oral Daily   acetaminophen **OR** acetaminophen, ALPRAZolam, alum & mag hydroxide-simeth, bisacodyl, butalbital-acetaminophen-caffeine, calcium carbonate, loratadine, [DISCONTINUED] ondansetron **OR** ondansetron (ZOFRAN) IV, senna, simethicone  Assessment/ Plan:  [name] is a 48 y.o. black female with end-stage renal disease started hemodialysis January 2020, hypertension, Down syndrome, morbid obesity, obstructive sleep apnea, not using CPAP due to claustrophobia now admitted for hypercarbic acute respiratory failure and generalized weakness  ESRD/MWF/[name] [facility] dialysis/[facility]/111 kg/ 240 min  1. End-stage renal disease: RIJ permcath. Complication of hemodialysis access: nonfunctioning AVF. permcath required cathflo but still did not work - Next treatment after access functioning.  - Appreciate vascular input.   2. Hypotension:  -Continue midodrine 5 mg p.o. 3 times daily.  3. Anemia with chronic kidney disease: hemoglobin 10.9 EPO as outpatient.   4. Secondary Hyperparathyroidism: Calcium and phosphorus at goal.  - calcium carbonate.    LOS: 9 Dawit Tankard 3/2/20201:47 PM

## 2018-11-27 NOTE — Progress Notes (Signed)
Hemodialysis attempted x2. Right chest permacath functioning poorly despite intervention. After first attempt, TPA was administered as ordered and allowed to dwell for 1 hour. After dwell time, treatment was attempted again and permacath continued to function poorly with displayed high machine arterial pressures causing machine pump to stop repeatedly. Dr Juleen China made aware. Primary nurse A. Quentin Cornwall made aware as well. Patient departed unit via dialysis chare with no acute distress.

## 2018-11-28 ENCOUNTER — Inpatient Hospital Stay: Admission: EM | Disposition: E | Payer: Self-pay | Source: Home / Self Care | Attending: Internal Medicine

## 2018-11-28 ENCOUNTER — Encounter: Payer: Self-pay | Admitting: Vascular Surgery

## 2018-11-28 ENCOUNTER — Other Ambulatory Visit (INDEPENDENT_AMBULATORY_CARE_PROVIDER_SITE_OTHER): Payer: Self-pay | Admitting: Nurse Practitioner

## 2018-11-28 DIAGNOSIS — Z992 Dependence on renal dialysis: Secondary | ICD-10-CM

## 2018-11-28 DIAGNOSIS — N186 End stage renal disease: Secondary | ICD-10-CM

## 2018-11-28 DIAGNOSIS — T82898A Other specified complication of vascular prosthetic devices, implants and grafts, initial encounter: Secondary | ICD-10-CM

## 2018-11-28 HISTORY — PX: DIALYSIS/PERMA CATHETER INSERTION: CATH118288

## 2018-11-28 LAB — CBC
HCT: 33.3 % — ABNORMAL LOW (ref 36.0–46.0)
Hemoglobin: 10.5 g/dL — ABNORMAL LOW (ref 12.0–15.0)
MCH: 28.3 pg (ref 26.0–34.0)
MCHC: 31.5 g/dL (ref 30.0–36.0)
MCV: 89.8 fL (ref 80.0–100.0)
Platelets: 284 10*3/uL (ref 150–400)
RBC: 3.71 MIL/uL — ABNORMAL LOW (ref 3.87–5.11)
RDW: 15.5 % (ref 11.5–15.5)
WBC: 14.2 10*3/uL — ABNORMAL HIGH (ref 4.0–10.5)
nRBC: 3 % — ABNORMAL HIGH (ref 0.0–0.2)

## 2018-11-28 LAB — GLUCOSE, CAPILLARY
Glucose-Capillary: 123 mg/dL — ABNORMAL HIGH (ref 70–99)
Glucose-Capillary: 113 mg/dL — ABNORMAL HIGH (ref 70–99)
Glucose-Capillary: 180 mg/dL — ABNORMAL HIGH (ref 70–99)

## 2018-11-28 SURGERY — DIALYSIS/PERMA CATHETER INSERTION
Anesthesia: Moderate Sedation

## 2018-11-28 MED ORDER — SODIUM CHLORIDE 0.9 % IV SOLN
INTRAVENOUS | Status: DC
  Administered 2018-11-28: 16:00:00 via INTRAVENOUS

## 2018-11-28 MED ORDER — MIDAZOLAM HCL 2 MG/2ML IJ SOLN
INTRAMUSCULAR | Status: DC | PRN
  Administered 2018-11-28 (×2): 0.5 mg via INTRAVENOUS

## 2018-11-28 MED ORDER — MIDAZOLAM HCL 2 MG/2ML IJ SOLN
INTRAMUSCULAR | Status: AC
  Filled 2018-11-28: qty 2

## 2018-11-28 MED ORDER — LIDOCAINE-EPINEPHRINE (PF) 1 %-1:200000 IJ SOLN
INTRAMUSCULAR | Status: AC
  Filled 2018-11-28: qty 30

## 2018-11-28 MED ORDER — PRO-STAT SUGAR FREE PO LIQD: 30.0000 mL | Freq: Three times a day (TID) | ORAL | Status: DC

## 2018-11-28 MED ORDER — LIDOCAINE HCL (PF) 1 % IJ SOLN
INTRAMUSCULAR | Status: AC
  Filled 2018-11-28: qty 30

## 2018-11-28 MED ORDER — HEPARIN (PORCINE) IN NACL 1000-0.9 UT/500ML-% IV SOLN
INTRAVENOUS | Status: AC
  Filled 2018-11-28: qty 1000

## 2018-11-28 MED ORDER — BACITRACIN-NEOMYCIN-POLYMYXIN 400-5-5000 EX OINT
TOPICAL_OINTMENT | CUTANEOUS | Status: AC
  Filled 2018-11-28: qty 1

## 2018-11-28 SURGICAL SUPPLY — 10 items
CATH PALINDROME-P 23CM W/VT (CATHETERS) ×2 IMPLANT
DERMABOND ADVANCED (GAUZE/BANDAGES/DRESSINGS) ×2
DERMABOND ADVANCED .7 DNX12 (GAUZE/BANDAGES/DRESSINGS) IMPLANT
DRAPE INCISE IOBAN 66X45 STRL (DRAPES) ×2 IMPLANT
GUIDEWIRE SUPER STIFF .035X180 (WIRE) ×2 IMPLANT
PACK ANGIOGRAPHY (CUSTOM PROCEDURE TRAY) ×2 IMPLANT
SUT MNCRL 4-0 (SUTURE) ×2
SUT MNCRL 4-0 27XMFL (SUTURE) ×1
SUT SILK 0 FSL (SUTURE) ×2 IMPLANT
SUTURE MNCRL 4-0 27XMF (SUTURE) IMPLANT

## 2018-11-28 NOTE — Op Note (Signed)
OPERATIVE NOTE   PROCEDURE: 1. Insertion of tunneled dialysis catheter right IJ approach same venous access.  PRE-OPERATIVE DIAGNOSIS: Nonfunction of existing tunneled dialysis catheter, and stage renal disease requiring hemodialysis   POST-OPERATIVE DIAGNOSIS: Same SURGEON: Hortencia Pilar  ANESTHESIA: Conscious sedation was administered under my direct supervision by the interventional radiology RN.  IV Versed plus fentanyl were utilized. Continuous ECG, pulse oximetry and blood pressure was monitored throughout the entire procedure.  Conscious sedation was for a total of 35 minutes.  ESTIMATED BLOOD LOSS: Minimal cc  CONTRAST USED:  None  FLUOROSCOPY TIME: 0.4 minutes  INDICATIONS:   Michele Bartlett is a 48 y.o.y.o. female who presents with poor flow and nonfunction of the tunneled dialysis catheter.  Adequate dialysis has not been possible.  DESCRIPTION: After obtaining full informed written consent, the patient was positioned supine. The right neck and chest wall was prepped and draped in a sterile fashion. The cuff is localized and using blunt and sharp dissection it is freed from the surrounding adhesions.  The existing catheter is then transected proximal to the cuff.  The guidewire is advanced without difficulty under fluoroscopy.  Dilators are passed over the wire as needed and the tunneled dialysis catheter is fed into the central venous system without difficulty.  Under fluoroscopy the catheter tip positioned at the atrial caval junction.  Both lumens aspirate and flush easily. After verification of smooth contour with proper tip position under fluoroscopy the catheter is packed with 5000 units of heparin per lumen.  Catheter secured to the skin of the right chest wall with 0 silk. A sterile dressing is applied with a Biopatch.  COMPLICATIONS: None  CONDITION: Good  Hortencia Pilar [name] Office:  [phone]   12/04/2018,6:28 PM

## 2018-11-28 NOTE — Progress Notes (Signed)
Initial Nutrition Assessment  DOCUMENTATION CODES:   Morbid obesity  INTERVENTION:   Discontinue Nepro supplements  Prostat liquid protein PO 30 ml TID, each supplement provides 100 kcal, 15 grams protein.  Rena-vite daily   NUTRITION DIAGNOSIS:   Increased nutrient needs related to chronic illness(ESRD on HD) as evidenced by increased estimated needs.  GOAL:   Patient will meet greater than or equal to 90% of their needs  MONITOR:   PO intake, Supplement acceptance, Labs, Weight trends, Skin, I & O's  REASON FOR ASSESSMENT:   LOS    ASSESSMENT:   48 y.o. black female with end-stage renal disease started hemodialysis January 2020, hypertension, Down syndrome, morbid obesity, obstructive sleep apnea, not using CPAP due to claustrophobia now admitted for hypercarbic acute respiratory failure and generalized weakness   Met with pt in room today. Pt reports poor appetite and oral intake pta but reports her appetite is improving in the hospital. Pt documented to be eating 100% of meals. Pt does not like the Nepro supplement as she reports this gives her diarrhea. Pt is willing to try Prostat. Per chart, pt appears fairly weight stable pta. Pt unable to have HD yesterday r/t catheter dysfunction. Pt reports her abdomen is distended today secondary to fluid.   Medications reviewed and include: colace, heparin, insulin, rena-vite, protonix  Labs reviewed: K 3.8 wnl, BUN 41(H), creat 4.51(H), P 4.7(H), Mg 2.2 wnl Wbc- 14.2(H), Hgb 10.5(L), Hct 33.3(L)  NUTRITION - FOCUSED PHYSICAL EXAM:    Most Recent Value  Orbital Region  No depletion  Upper Arm Region  No depletion  Thoracic and Lumbar Region  No depletion  Buccal Region  No depletion  Temple Region  No depletion  Clavicle Bone Region  No depletion  Clavicle and Acromion Bone Region  No depletion  Scapular Bone Region  No depletion  Dorsal Hand  No depletion  Patellar Region  No depletion  Anterior Thigh Region  No  depletion  Posterior Calf Region  No depletion  Edema (RD Assessment)  Mild  Hair  Reviewed  Eyes  Reviewed  Mouth  Reviewed  Skin  Reviewed  Nails  Reviewed     Diet Order:   Diet Order            Diet NPO time specified  Diet effective midnight             EDUCATION NEEDS:   Education needs have been addressed  Skin:  Skin Assessment: Reviewed RN Assessment(incision arm )  Last BM:  3/2- type 4  Height:   Ht Readings from Last 1 Encounters:  11/23/18 5' 1" (1.549 m)    Weight:   Wt Readings from Last 1 Encounters:  11/25/18 113.1 kg    Ideal Body Weight:  47.7 kg  BMI:  Body mass index is 47.11 kg/m.  Estimated Nutritional Needs:   Kcal:  2100-2300kcal/day   Protein:  >113g/day   Fluid:  UOP + 1L  Koleen Distance MS, RD, LDN Pager #- [phone] Office#- [phone] After Hours Pager: [phone]

## 2018-11-28 NOTE — Progress Notes (Signed)
PT Cancellation Note  Patient Details Name: Michele Bartlett MRN: [id number] DOB: April 02, 1971   Cancelled Treatment:    Reason Eval/Treat Not Completed: Patient at procedure or test/unavailable Pt out of room for procedure, will try back as appropriate.  Kreg Shropshire, DPT 12/16/2018, 4:48 PM

## 2018-11-28 NOTE — Progress Notes (Signed)
[facility]  ROUNDING NOTE   Subjective:   Unable to get hemodialysis treatment yesterday due to catheter malfunction.   Family at bedside. Concerned about plan of care.   Objective:  Vital signs in last 24 hours:  Temp:  [97.8 F (36.6 C)-98.4 F (36.9 C)] 97.8 F (36.6 C) (03/03 0518) Pulse Rate:  [66-79] 68 (03/03 1226) Resp:  [20] 20 (03/03 0518) BP: (131-141)/(76-86) 131/76 (03/03 1226) SpO2:  [97 %-100 %] 99 % (03/03 1226)  Weight change:  Filed Weights   11/23/18 1550 11/24/18 0945 11/25/18 0346  Weight: 115 kg 115 kg 113.1 kg    Intake/Output: I/O last 3 completed shifts: In: 0  Out: 800 [Urine:800]   Intake/Output this shift:  No intake/output data recorded.  Physical Exam: General: NAD, laying in bed  Head: Normocephalic, atraumatic. Moist oral mucosal membranes  Eyes: Anicteric, PERRL  Neck: Supple, trachea midline  Lungs:  Clear to auscultation  Heart: Regular rate and rhythm  Abdomen:  Soft, nontender,   Extremities: No peripheral edema.  Neurologic: Nonfocal, moving all four extremities  Skin: Left heel with pressure sore  Access: RIJ permcath, left AVF no thrill or bruit    Basic Metabolic Panel: Recent Labs  Lab 11/16/2018 0531 11/23/18 1332 11/24/18 1204  NA 135  --   --   K 3.8  --   --   CL 97*  --   --   CO2 28  --   --   GLUCOSE 93  --   --   BUN 41*  --   --   CREATININE 4.51*  --   --   CALCIUM 8.9  --   --   MG 2.2  --   --   PHOS  --  5.2* 4.7*    Liver Function Tests: No results for input(s): AST, ALT, ALKPHOS, BILITOT, PROT, ALBUMIN in the last 168 hours. No results for input(s): LIPASE, AMYLASE in the last 168 hours. No results for input(s): AMMONIA in the last 168 hours.  CBC: Recent Labs  Lab 11/23/18 0503 12/12/2018 0900  WBC 12.2* 14.2*  HGB 10.9* 10.5*  HCT 33.7* 33.3*  MCV 89.4 89.8  PLT 153 284    Cardiac Enzymes: No results for input(s): CKTOTAL, CKMB, CKMBINDEX, TROPONINI in the last 168  hours.  BNP: Invalid input(s): POCBNP  CBG: Recent Labs  Lab 11/27/18 1251 11/27/18 1625 11/27/18 2123 12/06/2018 0743 11/30/2018 1223  GLUCAP 146* 182* 150* 123* 113*    Microbiology: Results for orders placed or performed during the hospital encounter of 11/13/2018  Culture, blood (Routine X 2) w Reflex to ID Panel     Status: None   Collection Time: 11/13/2018  8:13 PM  Result Value Ref Range Status   Specimen Description BLOOD RIGHT FATTY CASTS  Final   Special Requests   Final    BOTTLES DRAWN AEROBIC AND ANAEROBIC Blood Culture adequate volume   Culture   Final    NO GROWTH 5 DAYS Performed at [facility], [address] [place], [place] [address]    Report Status 11/23/2018 FINAL  Final  MRSA PCR Screening     Status: None   Collection Time: 11/18/18  3:36 AM  Result Value Ref Range Status   MRSA by PCR NEGATIVE NEGATIVE Final    Comment:        The GeneXpert MRSA Assay (FDA approved for NASAL specimens only), is one component of a comprehensive MRSA colonization surveillance program. It  is not intended to diagnose MRSA infection nor to guide or monitor treatment for MRSA infections. Performed at [facility], [address] [place], [place] [address]   Culture, blood (Routine X 2) w Reflex to ID Panel     Status: None   Collection Time: 11/18/18  3:55 AM  Result Value Ref Range Status   Specimen Description BLOOD RIGHT HAND  Final   Special Requests   Final    BOTTLES DRAWN AEROBIC AND ANAEROBIC Blood Culture adequate volume   Culture   Final    NO GROWTH 5 DAYS Performed at [facility], [address], [place], [place] [address]    Report Status 11/23/2018 FINAL  Final    Coagulation Studies: No results for input(s): LABPROT, INR in the last 72 hours.  Urinalysis: No results for input(s): COLORURINE, LABSPEC, PHURINE, GLUCOSEU, HGBUR, BILIRUBINUR, KETONESUR, PROTEINUR, UROBILINOGEN, NITRITE, LEUKOCYTESUR in the last 72  hours.  Invalid input(s): APPERANCEUR    Imaging: No results found.   Medications:   . alteplase (TPA-ACTIVASE) *DIALYSIS CATH* 5 mg infusion    . alteplase (TPA-ACTIVASE) *DIALYSIS CATH* 5 mg infusion     . amiodarone  200 mg Oral BID  . Chlorhexidine Gluconate Cloth  6 each Topical Q0600  . docusate sodium  100 mg Oral BID  . feeding supplement (NEPRO CARB STEADY)  237 mL Oral BID BM  . fluticasone  1 spray Each Nare Daily  . heparin  5,000 Units Subcutaneous Q8H  . insulin aspart  0-5 Units Subcutaneous QHS  . insulin aspart  0-9 Units Subcutaneous TID WC  . midodrine  5 mg Oral TID WC  . mometasone-formoterol  2 puff Inhalation BID  . multivitamin  1 tablet Oral QHS  . pantoprazole  20 mg Oral Daily   acetaminophen **OR** acetaminophen, ALPRAZolam, alum & mag hydroxide-simeth, bisacodyl, butalbital-acetaminophen-caffeine, calcium carbonate, loratadine, [DISCONTINUED] ondansetron **OR** ondansetron (ZOFRAN) IV, senna, simethicone  Assessment/ Plan:  [name] is a 48 y.o. black female with end-stage renal disease started hemodialysis January 2020, hypertension, Down syndrome, morbid obesity, obstructive sleep apnea, not using CPAP due to claustrophobia now admitted for hypercarbic acute respiratory failure and generalized weakness  ESRD/MWF/[name] [facility] dialysis/[facility]/111 kg/ 240 min  1. End-stage renal disease: RIJ permcath. Complication of hemodialysis access: nonfunctioning AVF. permcath required cathflo but still did not work - Next treatment after access functioning.  - Appreciate vascular input.   2. Hypotension:  -Continue midodrine 5 mg p.o. 3 times daily.  3. Anemia with chronic kidney disease: hemoglobin 10.5 EPO as outpatient.   4. Secondary Hyperparathyroidism: Calcium and phosphorus at goal.  - calcium carbonate.    LOS: [place] 3/3/202012:57 PM

## 2018-11-28 NOTE — Progress Notes (Signed)
[facility]  Daily Progress Note   Subjective  - 6 Days Post-Op  I was contacted by Dr. Juleen China the catheter continues to malfunction.  Patient denies pain at the catheter site.  No fever chills while on dialysis.  Objective Vitals:   11/27/18 0553 11/27/18 1521 11/27/18 2025 12/02/2018 0518  BP: 133/85 136/86 136/85 (!) 141/86  Pulse: 68 79 78 66  Resp: 20  20 20   Temp: (!) 97.5 F (36.4 C)  98.4 F (36.9 C) 97.8 F (36.6 C)  TempSrc: Oral  Oral Oral  SpO2: 100% 99% 97% 100%  Weight:      Height:        Intake/Output Summary (Last 24 hours) at 12/07/2018 0816 Last data filed at 12/17/2018 0603 Gross per 24 hour  Intake 0 ml  Output 200 ml  Net -200 ml    PULM  Normal effort , no use of accessory muscles CV  No JVD, RRR Abd      No distended, nontender VASC  catheter clean dry and intact  Laboratory CBC    Component Value Date/Time   WBC 12.2 (H) 11/23/2018 0503   HGB 10.9 (L) 11/23/2018 0503   HGB 11.1 (L) 03/10/2012 2022   HCT 33.7 (L) 11/23/2018 0503   HCT 34.6 (L) 03/10/2012 2022   PLT 153 11/23/2018 0503   PLT 235 03/10/2012 2022    BMET    Component Value Date/Time   NA 135 11/04/2018 0531   NA 142 10/14/2017 1336   NA 139 03/10/2012 2022   K 3.8 11/07/2018 0531   K 4.3 03/10/2012 2022   CL 97 (L) 11/03/2018 0531   CL 103 03/10/2012 2022   CO2 28 11/21/2018 0531   CO2 28 03/10/2012 2022   GLUCOSE 93 11/13/2018 0531   GLUCOSE 99 03/10/2012 2022   BUN 41 (H) 11/03/2018 0531   BUN 16 10/14/2017 1336   BUN 25 (H) 03/10/2012 2022   CREATININE 4.51 (H) 11/01/2018 0531   CREATININE 1.84 (H) 03/10/2012 2022   CALCIUM 8.9 11/21/2018 0531   CALCIUM 9.0 03/10/2012 2022   GFRNONAA 11 (L) 11/20/2018 0531   GFRNONAA 34 (L) 03/10/2012 2022   GFRAA 13 (L) 11/07/2018 0531   GFRAA 39 (L) 03/10/2012 2022    Assessment/Planning:   Complication of dialysis access device:  Patient has failed TPA infusion.  Therefore we will plan to  replace the tunnel catheter same venous access.  Given her difficulties with anesthesia and sedation we will focus primarily on local anesthesia with an absolute minimum of conscious sedation.    Belenda Cruise Jordana Dugue  12/26/2018, 8:16 AM

## 2018-11-28 NOTE — Progress Notes (Signed)
[facility] NAME: Michele Bartlett    MR#:  [id number]  DATE OF BIRTH:  [birth year]  SUBJECTIVE:  new complaints. Mom in the room. REVIEW OF SYSTEMS:  CONSTITUTIONAL: No fever, fatigue or weakness.  EYES: No blurred or double vision.  EARS, NOSE, AND THROAT: No tinnitus or ear pain.  RESPIRATORY: No cough, shortness of breath, wheezing or hemoptysis.  CARDIOVASCULAR: No chest pain, orthopnea, edema.  GASTROINTESTINAL: No nausea, vomiting, diarrhea or abdominal pain.  GENITOURINARY: No dysuria, hematuria.  ENDOCRINE: No polyuria, nocturia,  HEMATOLOGY: No anemia, easy bruising or bleeding SKIN: No rash or lesion. MUSCULOSKELETAL: No joint pain or arthritis.   NEUROLOGIC: No tingling, numbness, weakness.  PSYCHIATRY: No anxiety or depression.   DRUG ALLERGIES:   Allergies  Allergen Reactions  . Novocain [Procaine] Rash    Skin peel raw all over    VITALS:  Blood pressure 131/76, pulse 68, temperature 97.8 F (36.6 C), temperature source Oral, resp. rate 20, height 5\' 1"  (1.549 m), weight 113.1 kg, last menstrual period 11/02/2018, SpO2 99 %.  PHYSICAL EXAMINATION:  GENERAL:  48 y.o.-year-old patient lying in the bed with no acute distress. Morbidly obese EYES: Pupils equal, round, reactive to light and accommodation. No scleral icterus. Extraocular muscles intact.  HEENT: Head atraumatic, normocephalic. Oropharynx and nasopharynx clear.  NECK:  Supple, no jugular venous distention. No thyroid enlargement, no tenderness.  LUNGS: Normal breath sounds bilaterally, no wheezing, rales,rhonchi or crepitation. No use of accessory muscles of respiration. Perm Cath CARDIOVASCULAR: S1, S2 normal. No murmurs, rubs, or gallops.  ABDOMEN: Soft, nontender, nondistended. Bowel sounds present.  EXTREMITIES: No pedal edema, cyanosis, or clubbing.  NEUROLOGIC: Awake, alert and oriented x3. Sensation intact. Gait not checked.   PSYCHIATRIC: The patient is alert and oriented x 2.  SKIN: No obvious rash, lesion, or ulcer.    LABORATORY PANEL:   CBC Recent Labs  Lab 12/18/2018 0900  WBC 14.2*  HGB 10.5*  HCT 33.3*  PLT 284   ------------------------------------------------------------------------------------------------------------------  Chemistries  Recent Labs  Lab 10/30/2018 0531  NA 135  K 3.8  CL 97*  CO2 28  GLUCOSE 93  BUN 41*  CREATININE 4.51*  CALCIUM 8.9  MG 2.2   ------------------------------------------------------------------------------------------------------------------  Cardiac Enzymes No results for input(s): TROPONINI in the last 168 hours. ------------------------------------------------------------------------------------------------------------------  RADIOLOGY:  No results found.  EKG:   Orders placed or performed during the hospital encounter of 11/07/2018  . ED EKG  . ED EKG  . EKG 12-Lead  . EKG 12-Lead    ASSESSMENT AND PLAN:   #Acute hypoxic respiratory failure and hypercapnia -secondary to noncompliance with the CPAP off BiPAP Clinically improving-- tolerating CPAP well Reinforced the importance of being compliant with CPAP.  Patient is tolerating CPAP with facial mask .patient can try nasal mask with chinstrap if she could not tolerate facemask F/u  with [facility] pulmonology dr.fortin on February 25, which is rescheduled now Requiring 2 L of oxygen via nasal cannula continuous at this time Okay to discharge patient from pulmonology standpoint at [facility]  #Nonsustained V. Tach-recurrent Spontaneously resolving Electrolytes are in normal range Cardiology consult placed to Dr. Clayborn Bigness has seen the patient, recommending to repeat echocardiogram Amiodarone started on 11/21/2018 as patient had another episode of nonsustained V. Tach  #Orthostatic hypotension Patient is a dialysis patient   midodrine dose increased to 5 mg 3 times daily.  Dizziness  resolved  #End-stage renal disease on hemodialysis-nephrology is following during  the hospital course Patient will get hemodialysis after declotting of the permacath by Dr. Virl Diamond was canceled as patient became apneic after anesthesia -patient's HD Access (perm cath) non-functional. Dr Delana Meyer to change the channel catheter today with local anesthesia and minimal conscious sedation  #Chronic right-sided congestive heart failure No exacerbation at this time  #Essential hypertension not on any medications Blood pressure is okay becoming orthostatic  #Obstructive sleep apnea continue CPAP nightly  #Constipation -laxatives and stool softeners.  Patient will get enema today after procedure  #Generalized weakness PT consult -recommending skilled nursing facility  [facility] bed accepted. Awaiting [facility] for pt. She is best at baseline and ready for discharge once HD access is established  CODE STATUS: fc   TOTAL TIME TAKING CARE OF THIS PATIENT: 32  minutes.   POSSIBLE D/C IN 1-2  DAYS, DEPENDING ON CLINICAL CONDITION.  Note: This dictation was prepared with Dragon dictation along with smaller phrase technology. Any transcriptional errors that result from this process are unintentional.   [name] M.D on 12/17/2018 at 2:13 PM  Between 7am to 6pm - Pager - [phone] After 6pm go to [email or web address] - password [email or web address] [facility]  [facility]  Office  [phone]  CC: Primary care physician; Lavera Guise, MD

## 2018-11-29 ENCOUNTER — Encounter: Payer: Self-pay | Admitting: Vascular Surgery

## 2018-11-29 LAB — CBC

## 2018-11-29 LAB — CBC WITH DIFFERENTIAL/PLATELET
Abs Immature Granulocytes: 0.2 10*3/uL — ABNORMAL HIGH (ref 0.00–0.07)
BASOS ABS: 0.2 10*3/uL — AB (ref 0.0–0.1)
BASOS PCT: 1 %
Band Neutrophils: 1 %
Eosinophils Absolute: 0 10*3/uL (ref 0.0–0.5)
Eosinophils Relative: 0 %
HCT: 32.9 % — ABNORMAL LOW (ref 36.0–46.0)
Hemoglobin: 10 g/dL — ABNORMAL LOW (ref 12.0–15.0)
Lymphocytes Relative: 16 %
Lymphs Abs: 3.6 10*3/uL (ref 0.7–4.0)
MCH: 28.2 pg (ref 26.0–34.0)
MCHC: 30.4 g/dL (ref 30.0–36.0)
MCV: 92.9 fL (ref 80.0–100.0)
Metamyelocytes Relative: 1 %
Monocytes Absolute: 2.7 10*3/uL — ABNORMAL HIGH (ref 0.1–1.0)
Monocytes Relative: 12 %
NRBC: 9.6 % — AB (ref 0.0–0.2)
Neutro Abs: 15.6 10*3/uL — ABNORMAL HIGH (ref 1.7–7.7)
Neutrophils Relative %: 69 %
Platelets: 239 10*3/uL (ref 150–400)
RBC: 3.54 MIL/uL — ABNORMAL LOW (ref 3.87–5.11)
RDW: 16 % — ABNORMAL HIGH (ref 11.5–15.5)
Smear Review: NORMAL
WBC: 22.3 10*3/uL — ABNORMAL HIGH (ref 4.0–10.5)
nRBC: 10 /100 WBC — ABNORMAL HIGH

## 2018-11-29 LAB — COMPREHENSIVE METABOLIC PANEL
ALT: 40 U/L (ref 0–44)
AST: 63 U/L — AB (ref 15–41)
Albumin: 2.8 g/dL — ABNORMAL LOW (ref 3.5–5.0)
Alkaline Phosphatase: 49 U/L (ref 38–126)
Anion gap: 20 — ABNORMAL HIGH (ref 5–15)
BUN: 64 mg/dL — ABNORMAL HIGH (ref 6–20)
CO2: 33 mmol/L — ABNORMAL HIGH (ref 22–32)
CREATININE: 3.95 mg/dL — AB (ref 0.44–1.00)
Calcium: 12 mg/dL — ABNORMAL HIGH (ref 8.9–10.3)
Chloride: 89 mmol/L — ABNORMAL LOW (ref 98–111)
GFR calc Af Amer: 15 mL/min — ABNORMAL LOW (ref >60)
GFR calc non Af Amer: 13 mL/min — ABNORMAL LOW (ref >60)
GLUCOSE: 185 mg/dL — AB (ref 70–99)
Potassium: 6.8 mmol/L (ref 3.5–5.1)
Sodium: 142 mmol/L (ref 135–145)
Total Bilirubin: 1.6 mg/dL — ABNORMAL HIGH (ref 0.3–1.2)
Total Protein: 6.1 g/dL — ABNORMAL LOW (ref 6.5–8.1)

## 2018-11-29 LAB — GLUCOSE, CAPILLARY: Glucose-Capillary: 150 mg/dL — ABNORMAL HIGH (ref 70–99)

## 2018-11-29 LAB — PHOSPHORUS: Phosphorus: 6.5 mg/dL — ABNORMAL HIGH (ref 2.5–4.6)

## 2018-11-29 LAB — APTT: aPTT: 38 seconds — ABNORMAL HIGH (ref 24–36)

## 2018-11-29 LAB — TROPONIN I: Troponin I: 0.1 ng/mL (ref <0.03)

## 2018-11-29 LAB — TRIGLYCERIDES: Triglycerides: 73 mg/dL (ref <150)

## 2018-11-29 LAB — LACTIC ACID, PLASMA: Lactic Acid, Venous: >11 mmol/L (ref 0.5–1.9)

## 2018-11-29 LAB — PROTIME-INR
INR: 1.5 — ABNORMAL HIGH (ref 0.8–1.2)
Prothrombin Time: 18.2 seconds — ABNORMAL HIGH (ref 11.4–15.2)

## 2018-11-29 LAB — MAGNESIUM: Magnesium: 2.6 mg/dL — ABNORMAL HIGH (ref 1.7–2.4)

## 2018-11-29 MED ORDER — NOREPINEPHRINE BITARTRATE 1 MG/ML IV SOLN
0.0000 ug/min | INTRAVENOUS | Status: DC
  Filled 2018-11-29: qty 16

## 2018-11-29 MED ORDER — SODIUM CHLORIDE 0.9% IV SOLUTION: Freq: Once | INTRAVENOUS | Status: DC

## 2018-11-29 MED ORDER — HEPARIN SODIUM (PORCINE) 1000 UNIT/ML DIALYSIS
50.0000 [IU]/kg | INTRAMUSCULAR | Status: DC | PRN
  Administered 2018-11-29: 5700 [IU] via INTRAVENOUS_CENTRAL
  Filled 2018-11-29 (×2): qty 6

## 2018-11-29 MED ORDER — PROPOFOL 1000 MG/100ML IV EMUL
5.0000 ug/kg/min | INTRAVENOUS | Status: DC
  Filled 2018-11-29: qty 100

## 2018-11-29 MED ORDER — VASOPRESSIN 20 UNIT/ML IV SOLN
0.0300 [IU]/min | INTRAVENOUS | Status: DC
  Filled 2018-11-29: qty 2

## 2018-11-30 ENCOUNTER — Ambulatory Visit (INDEPENDENT_AMBULATORY_CARE_PROVIDER_SITE_OTHER): Payer: Self-pay | Admitting: Vascular Surgery

## 2018-12-01 ENCOUNTER — Telehealth: Payer: Self-pay

## 2018-12-01 NOTE — Telephone Encounter (Signed)
Death certificate obtained  Per Dr. Darnell Level this needs to go to the MD that filled out the death summary and treated the patient at that time. This would be either Dr. Posey Pronto or Dr. Juleen China. Call made to [facility], spoke with Hassan Rowan, made aware it needs to go to dr patel or kolluru. Voiced understanding. Nothing further is needed at this time.

## 2018-12-27 NOTE — Code Documentation (Signed)
CODE BLUE cardiac arrest was called for a patient upon arrival Dr. Juleen China with nephrology was performing ACLS with nurses.  Patient had apparently lost pulses during dialysis. All hospital protocol was initiated ACLS meds were given.

## 2018-12-27 NOTE — ED Provider Notes (Signed)
[facility] Department of Emergency Medicine   Code Blue CONSULT NOTE  Chief Complaint: Cardiac arrest/unresponsive   Level V Caveat: Unresponsive  History of present illness: I was contacted by the hospital for a CODE BLUE cardiac arrest and presented to the patient's bedside.  Upon arrival Dr. Juleen China with nephrology was performing ACLS with nurses.  Patient had apparently lost pulses during dialysis.  I asked how I could help and they requested an airway  ROS: Unable to obtain, Level V caveat  Scheduled Meds: . sodium chloride   Intravenous Once  . sodium chloride   Intravenous Once  . amiodarone  200 mg Oral BID  . Chlorhexidine Gluconate Cloth  6 each Topical Q0600  . docusate sodium  100 mg Oral BID  . feeding supplement (PRO-STAT SUGAR FREE 64)  30 mL Oral TID  . fluticasone  1 spray Each Nare Daily  . heparin  5,000 Units Subcutaneous Q8H  . insulin aspart  0-5 Units Subcutaneous QHS  . insulin aspart  0-9 Units Subcutaneous TID WC  . midodrine  5 mg Oral TID WC  . mometasone-formoterol  2 puff Inhalation BID  . multivitamin  1 tablet Oral QHS  . pantoprazole  20 mg Oral Daily   Continuous Infusions: . alteplase (TPA-ACTIVASE) *DIALYSIS CATH* 5 mg infusion    . alteplase (TPA-ACTIVASE) *DIALYSIS CATH* 5 mg infusion    . norepinephrine (LEVOPHED) Adult infusion    . propofol (DIPRIVAN) infusion     PRN Meds:.acetaminophen **OR** acetaminophen, ALPRAZolam, alum & mag hydroxide-simeth, bisacodyl, butalbital-acetaminophen-caffeine, calcium carbonate, heparin, loratadine, [DISCONTINUED] ondansetron **OR** ondansetron (ZOFRAN) IV, senna, simethicone Past Medical History:  Diagnosis Date  . Asthma   . Diabetes mellitus without complication ([place])   . Down's syndrome   . Dysrhythmia   . Edema    FEET/LEG  . Environmental allergies   . Hypertension   . Kidney disease    STAGE 4  . Nephrotic syndrome   . Pneumonia   . Pulmonary hypertension ([place])    . Sleep apnea    mother reports she does not use CPAP because it causes panic attacks  . Wheezing    Past Surgical History:  Procedure Laterality Date  . A/V SHUNT INTERVENTION Left 11/15/2018   Procedure: A/V SHUNT INTERVENTION;  Surgeon: Katha Cabal, MD;  Location: [place] [facility];  Service: Cardiovascular;  Laterality: Left;  . AV FISTULA PLACEMENT Left 11/15/2018   Procedure: ARTERIOVENOUS (AV) FISTULA CREATION (Brachiocephalic);  Surgeon: Katha Cabal, MD;  Location: [facility] ORS;  Service: Vascular;  Laterality: Left;  . BREAST BIOPSY Left 03/22/2018   affirm stero biopsy x 2 areas/path pending  . CATARACT EXTRACTION W/PHACO Left 06/15/2018   Procedure: CATARACT EXTRACTION PHACO AND INTRAOCULAR LENS PLACEMENT (IOC);  Surgeon: Leandrew Koyanagi, MD;  Location: [facility] ORS;  Service: Ophthalmology;  Laterality: Left;  [place] 01:35 AP% 9.9 CDE 12.6 Fluid pack lot # [id number]  . DIALYSIS/PERMA CATHETER INSERTION N/A 10/11/2018   Procedure: DIALYSIS/PERMA CATHETER INSERTION;  Surgeon: Algernon Huxley, MD;  Location: [place] [facility];  Service: Cardiovascular;  Laterality: N/A;  . DIALYSIS/PERMA CATHETER INSERTION N/A 11/27/2018   Procedure: DIALYSIS/PERMA CATHETER INSERTION;  Surgeon: Katha Cabal, MD;  Location: [place] [facility];  Service: Cardiovascular;  Laterality: N/A;  . EYE SURGERY    . INGUINAL HERNIA REPAIR  as a baby  . TONSILLECTOMY     Social History   Socioeconomic History  . Marital status: Single  Spouse name: Not on file  . Number of children: Not on file  . Years of education: Not on file  . Highest education level: Not on file  Occupational History  . Not on file  Social Needs  . Financial resource strain: Not on file  . Food insecurity:    Worry: Not on file    Inability: Not on file  . Transportation needs:    Medical: Not on file    Non-medical: Not on file  Tobacco Use  . Smoking status: Never Smoker  . Smokeless tobacco: Never  Used  Substance and Sexual Activity  . Alcohol use: No  . Drug use: No  . Sexual activity: Not on file  Lifestyle  . Physical activity:    Days per week: Not on file    Minutes per session: Not on file  . Stress: Not on file  Relationships  . Social connections:    Talks on phone: Not on file    Gets together: Not on file    Attends religious service: Not on file    Active member of club or organization: Not on file    Attends meetings of clubs or organizations: Not on file    Relationship status: Not on file  . Intimate partner violence:    Fear of current or ex partner: Not on file    Emotionally abused: Not on file    Physically abused: Not on file    Forced sexual activity: Not on file  Other Topics Concern  . Not on file  Social History Narrative  . Not on file   Allergies  Allergen Reactions  . Novocain [Procaine] Rash    Skin peel raw all over    Last set of Vital Signs (not current) Vitals:   2018/12/04 1130 December 04, 2018 1145  BP: 115/71 118/69  Pulse: 83 81  Resp: (!) 27 (!) 29  Temp:    SpO2: 97% 97%      Physical Exam  Gen: unresponsive Cardiovascular: Positive pulse Resp: Agonal breath sounds, breath sounds equal bilaterally with bagging  Abd: nondistended  Neuro: GCS 3, unresponsive to pain  HEENT: No blood in posterior pharynx, gag reflex absent  Neck: No crepitus   Skin: warm  Procedures  INTUBATION Performed by: Lavonia Drafts Required items: required blood products, implants, devices, and special equipment available Patient identity confirmed: provided demographic data and hospital-assigned identification number Time out: Immediately prior to procedure a "time out" was called to verify the correct patient, procedure, equipment, support staff and site/side marked as required. Indications: Altered mental status, agonal respirations Intubation method: DL Preoxygenation: BVM Sedatives: Etomidate Paralytic: Rocuronium Tube Size: 7.5  cuffed Post-procedure assessment: chest rise Breath sounds: equal and absent over the epigastrium Tube secured by Respiratory Therapy Patient tolerated the procedure well with no immediate complications.      Medical Decision making  After intubating the patient, she had a pulse with blood pressure of 155/90, ordered propofol drip.  At that point Dr. Patsey Berthold from intensive care arrived to evaluate the patient      Lavonia Drafts, MD Dec 04, 2018 1239

## 2018-12-27 NOTE — Progress Notes (Signed)
RT present at Code Blue, This RT maintained airway and oxygenation with BVM.

## 2018-12-27 NOTE — Progress Notes (Signed)
Pt was talking to me then became unresponsive, Rapid Response called. MD at the chairside.

## 2018-12-27 NOTE — Progress Notes (Signed)
Pre HD Assessment    12/23/2018 1100  Neurological  Level of Consciousness Alert  Orientation Level Oriented X4  Respiratory  Respiratory Pattern Regular;Unlabored  Chest Assessment Chest expansion symmetrical  Bilateral Breath Sounds Clear;Diminished  Cardiac  Pulse Regular  Heart Sounds S1, S2  ECG Monitor Yes  Vascular  R Radial Pulse +2  L Radial Pulse +2  Edema Generalized  Generalized Edema +2  Psychosocial  Psychosocial (WDL) WDL

## 2018-12-27 NOTE — Progress Notes (Signed)
[facility]  ROUNDING NOTE   Subjective:   Seen and examined on hemodialysis treatment. UF of 2.6 liters. Tolerating treatment well. New CVC RIJ placed yesterday by Dr. Delana Meyer    HEMODIALYSIS FLOWSHEET:  Blood Flow Rate (mL/min): 200 mL/min Arterial Pressure (mmHg): -180 mmHg Venous Pressure (mmHg): 190 mmHg Transmembrane Pressure (mmHg): 50 mmHg Ultrafiltration Rate (mL/min): 720 mL/min Dialysate Flow Rate (mL/min): 800 ml/min Conductivity: Machine : 14 Conductivity: Machine : 14 Dialysis Fluid Bolus: Normal Saline Bolus Amount (mL): 250 mL    Objective:  Vital signs in last 24 hours:  Temp:  [96 F (35.6 C)-98.2 F (36.8 C)] 97.4 F (36.3 C) (03/04 0546) Pulse Rate:  [68-92] 81 (03/04 1115) Resp:  [17-36] 36 (03/04 1115) BP: (119-173)/(75-99) 123/78 (03/04 1115) SpO2:  [91 %-100 %] 100 % (03/04 1115) FiO2 (%):  [100 %] 100 % (03/03 1745) Weight:  [113.1 kg] 113.1 kg (03/03 1605)  Weight change:  Filed Weights   11/24/18 0945 11/25/18 0346 12/15/2018 1605  Weight: 115 kg 113.1 kg 113.1 kg    Intake/Output: I/O last 3 completed shifts: In: 3 [P.O.:3] Out: 550 [Urine:550]   Intake/Output this shift:  No intake/output data recorded.  Physical Exam: General: NAD, laying in bed  Head: Normocephalic, atraumatic. Moist oral mucosal membranes  Eyes: Anicteric, PERRL  Neck: Supple, trachea midline  Lungs:  Clear to auscultation  Heart: Regular rate and rhythm  Abdomen:  Soft, nontender,   Extremities: No peripheral edema.  Neurologic: Nonfocal, moving all four extremities  Skin: Left heel with pressure sore  Access: RIJ permcath, left AVF no thrill or bruit    Basic Metabolic Panel: Recent Labs  Lab 11/23/18 1332 11/24/18 1204  PHOS 5.2* 4.7*    Liver Function Tests: No results for input(s): AST, ALT, ALKPHOS, BILITOT, PROT, ALBUMIN in the last 168 hours. No results for input(s): LIPASE, AMYLASE in the last 168 hours. No results for  input(s): AMMONIA in the last 168 hours.  CBC: Recent Labs  Lab 11/23/18 0503 12/16/2018 0900  WBC 12.2* 14.2*  HGB 10.9* 10.5*  HCT 33.7* 33.3*  MCV 89.4 89.8  PLT 153 284    Cardiac Enzymes: No results for input(s): CKTOTAL, CKMB, CKMBINDEX, TROPONINI in the last 168 hours.  BNP: Invalid input(s): POCBNP  CBG: Recent Labs  Lab 11/27/18 2123 12/23/2018 0743 12/23/2018 1223 12/24/2018 2057 Dec 27, 2018 0755  GLUCAP 150* 123* 113* 180* 150*    Microbiology: Results for orders placed or performed during the hospital encounter of 10/31/2018  Culture, blood (Routine X 2) w Reflex to ID Panel     Status: None   Collection Time: 11/04/2018  8:13 PM  Result Value Ref Range Status   Specimen Description BLOOD RIGHT FATTY CASTS  Final   Special Requests   Final    BOTTLES DRAWN AEROBIC AND ANAEROBIC Blood Culture adequate volume   Culture   Final    NO GROWTH 5 DAYS Performed at [facility], [address] [place], [place] [address]    Report Status 11/23/2018 FINAL  Final  MRSA PCR Screening     Status: None   Collection Time: 11/18/18  3:36 AM  Result Value Ref Range Status   MRSA by PCR NEGATIVE NEGATIVE Final    Comment:        The GeneXpert MRSA Assay (FDA approved for NASAL specimens only), is one component of a comprehensive MRSA colonization surveillance program. It is not intended to diagnose MRSA infection nor to guide or  monitor treatment for MRSA infections. Performed at [facility], [address] [place], [place] [address]   Culture, blood (Routine X 2) w Reflex to ID Panel     Status: None   Collection Time: 11/18/18  3:55 AM  Result Value Ref Range Status   Specimen Description BLOOD RIGHT HAND  Final   Special Requests   Final    BOTTLES DRAWN AEROBIC AND ANAEROBIC Blood Culture adequate volume   Culture   Final    NO GROWTH 5 DAYS Performed at [facility], [address], [place],  [address]    Report Status  11/23/2018 FINAL  Final    Coagulation Studies: No results for input(s): LABPROT, INR in the last 72 hours.  Urinalysis: No results for input(s): COLORURINE, LABSPEC, PHURINE, GLUCOSEU, HGBUR, BILIRUBINUR, KETONESUR, PROTEINUR, UROBILINOGEN, NITRITE, LEUKOCYTESUR in the last 72 hours.  Invalid input(s): APPERANCEUR    Imaging: No results found.   Medications:   . alteplase (TPA-ACTIVASE) *DIALYSIS CATH* 5 mg infusion    . alteplase (TPA-ACTIVASE) *DIALYSIS CATH* 5 mg infusion     . amiodarone  200 mg Oral BID  . Chlorhexidine Gluconate Cloth  6 each Topical Q0600  . docusate sodium  100 mg Oral BID  . feeding supplement (PRO-STAT SUGAR FREE 64)  30 mL Oral TID  . fluticasone  1 spray Each Nare Daily  . heparin  5,000 Units Subcutaneous Q8H  . insulin aspart  0-5 Units Subcutaneous QHS  . insulin aspart  0-9 Units Subcutaneous TID WC  . midodrine  5 mg Oral TID WC  . mometasone-formoterol  2 puff Inhalation BID  . multivitamin  1 tablet Oral QHS  . pantoprazole  20 mg Oral Daily   acetaminophen **OR** acetaminophen, ALPRAZolam, alum & mag hydroxide-simeth, bisacodyl, butalbital-acetaminophen-caffeine, calcium carbonate, heparin, loratadine, [DISCONTINUED] ondansetron **OR** ondansetron (ZOFRAN) IV, senna, simethicone  Assessment/ Plan:  [name] is a 48 y.o. black female with end-stage renal disease started hemodialysis January 2020, hypertension, Down syndrome, morbid obesity, obstructive sleep apnea, not using CPAP due to claustrophobia now admitted for hypercarbic acute respiratory failure and generalized weakness  ESRD/MWF/[name] [facility] dialysis/[facility]/111 kg/ 240 min  1. End-stage renal disease: RIJ permcath. Complication of hemodialysis access: nonfunctioning AVF.  - Seen and examined on hemodialysis treatment via RIJ permcath  2. Hypotension:  -Continue midodrine 5 mg p.o. 3 times daily.  3. Anemia with chronic kidney disease: hemoglobin 10.5 EPO  as outpatient.   4. Secondary Hyperparathyroidism: Calcium and phosphorus at goal.  - calcium carbonate.    LOS: [place] 03/22/202011:30 AM

## 2018-12-27 NOTE — Progress Notes (Signed)
Pt rinsed back and CVC clamped.

## 2018-12-27 NOTE — Progress Notes (Addendum)
PT Cancellation Note  Patient Details Name: Michele Bartlett MRN: [id number] DOB: [birth year]   Cancelled Treatment:    Reason Eval/Treat Not Completed: Medical issues which prohibited therapy. Pt was in dialysis this morning; upon chart review this afternoon, a Code Blue was called on pt while in dialysis and in unstable heart rhythm with team arrived. Per recent MD note; pt CTB.    Larae Grooms, PTA December 08, 2018, 3:12 PM

## 2018-12-27 NOTE — Code Documentation (Addendum)
CODE BLUE was called to the Dialysis [facility].  The patient had been on dialysis.  On my arrival she had been intubated by the emergency room physician.  Dr. Juleen China was running the code.  The patient had very unstable rhythm.  She had history of Downs Syndrome, end-stage renal disease and severe pulmonary hypertension with right ventricular failure.  Assisted Dr. Juleen China with the code.  As noted the patient never regained stable rhythm and alternated between asystole and wide-complex tachycardia.  Patient failed to respond to ACLS protocol (see separate code sheet) and attempts at cardioversion.  She failed to respond to pressors.  Despite aggressive resuscitative efforts she reverted to asystole.  Dr. Juleen China had called the patient's parents to the bedside.  Parents were at the bedside when the patient expired.  Attending physician, Dr. [name], was notified by Dr. Juleen China.

## 2018-12-27 NOTE — Progress Notes (Signed)
Pt is unresponsive but has a pule, MD at the chairside, O2 applied, pt placed in modified trendelenburg position, rapid response team has arrived.

## 2018-12-27 DEATH — deceased

## 2019-01-26 NOTE — Death Summary Note (Signed)
  DEATH SUMMARY   Patient Details  Name: Michele Bartlett MRN: [id number] DOB: July 06, 1971  Admission/Discharge Information   Admit Date:  Dec 07, 2018  Date of Death: Date of Death: 2018-12-19  Time of Death: Time of Death: 1244-01-06  Length of Stay: 12-26-22  Referring Physician: Lavera Guise, MD   Reason(s) for Hospitalization  acute cardiorespiratory arrest/PEA  Diagnoses  Preliminary cause of death:  Secondary Diagnoses (including complications and co-morbidities):  Principal Problem:   Acute respiratory failure with hypoxia and hypercapnia ([place]) Active Problems:   Type 2 diabetes mellitus with diabetic chronic kidney disease ([place])   Essential (primary) hypertension   Obstructive sleep apnea, adult   End stage renal disease [facility])   Brief Hospital Course (including significant findings, care, treatment, and services provided and events leading to death)  Michele Bartlett is a 48 y.o. year old female who was admitted with acute hypoxic/hyper Respiratory failure, nonsustainable ventricular tachycardia recurrent end-stage renal disease on hemodialysis along with hypertension and obstructive sleep apnea. Patient had a prolonged hospital stay given various complications and comorbidities. Code blue was called on 12-19-2022 due to cardiac arrest. A CLS protocol was initiated CPR was initiated patient was intubated. Patient was transferred to the ICU. However she did not make it and passed away on 12/19/22 at 12:45 PM.    Pertinent Labs and Studies  Significant Diagnostic Studies No results found.  Microbiology No results found for this or any previous visit (from the past 240 hour(s)).  Lab Basic Metabolic Panel: No results for input(s): NA, K, CL, CO2, GLUCOSE, BUN, CREATININE, CALCIUM, MG, PHOS in the last 168 hours. Liver Function Tests: No results for input(s): AST, ALT, ALKPHOS, BILITOT, PROT, ALBUMIN in the last 168 hours. No results for input(s): LIPASE, AMYLASE in the last 168 hours. No  results for input(s): AMMONIA in the last 168 hours. CBC: No results for input(s): WBC, NEUTROABS, HGB, HCT, MCV, PLT in the last 168 hours. Cardiac Enzymes: No results for input(s): CKTOTAL, CKMB, CKMBINDEX, TROPONINI in the last 168 hours. Sepsis Labs: No results for input(s): PROCALCITON, WBC, LATICACIDVEN in the last 168 hours.     [name] 12/27/2018, 7:16 AM

## 2019-01-26 NOTE — Discharge Summary (Signed)
Death summary dictated

## 2019-02-12 ENCOUNTER — Ambulatory Visit: Payer: Self-pay | Admitting: Nurse Practitioner

## 2019-02-15 ENCOUNTER — Ambulatory Visit: Payer: Self-pay | Admitting: Nurse Practitioner

## 2019-02-16 IMAGING — US US BREAST*L* LIMITED INC AXILLA
1 series · 1 of 1 positions shown · non-contrast
Comparison: Previous exam(s).

CLINICAL DATA: Screening recall for left breast calcifications.

EXAM:
DIGITAL DIAGNOSTIC LEFT MAMMOGRAM WITH CAD AND TOMO
ULTRASOUND LEFT BREAST

[Series 1: us breast*left* limited inc axilla · 0.09mm/px · 1 of 1 slices shown]
[im 1/1]
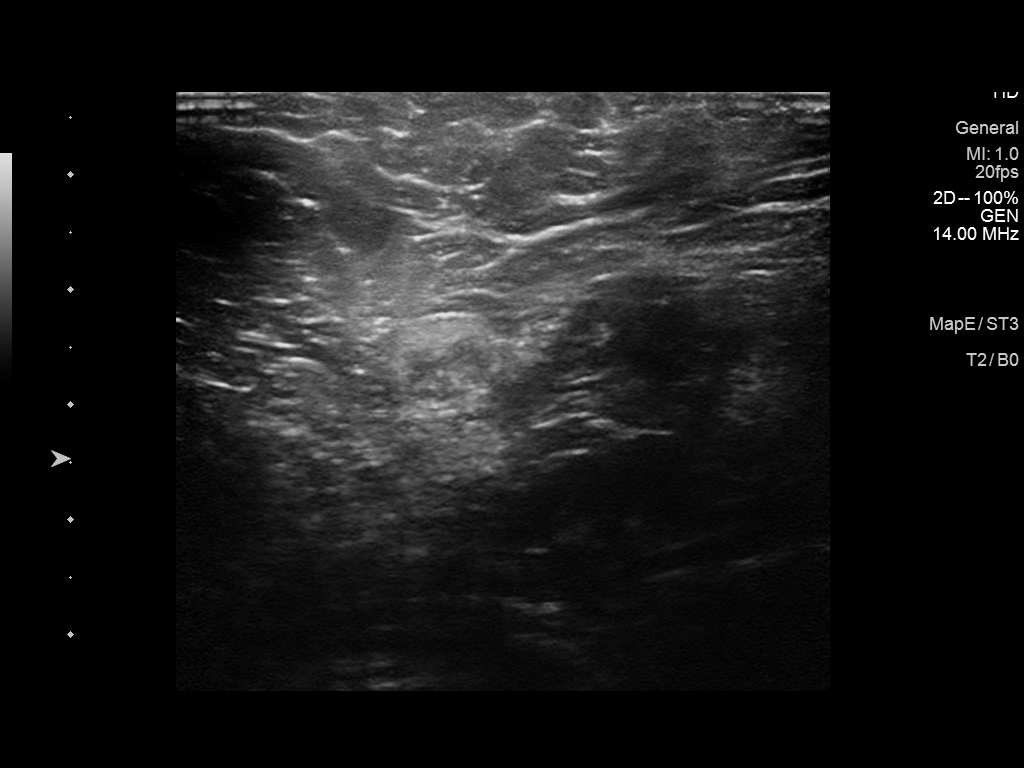

[1 of 1 positions shown; findings below may reference images not displayed]

ACR Breast Density Category b: There are scattered areas of
fibroglandular density.
FINDINGS: Spot compression magnification views were performed of the left
breast. There is a linear oriented group of calcifications in the
slightly upper inner left breast posterior depth measuring 9 mm.
There is an additional group of similar appearing calcifications in
the upper central left breast mid depth spanning 7-8 mm. There is an
asymmetry in the slightly outer/periareolar left breast possibly
related to dense fibroglandular tissue.

Mammographic images were processed with CAD.

Physical examination the slightly outer left breast does not reveal
any palpable masses. Targeted ultrasound of the left breast was
performed. No suspicious masses or abnormalities identified. The
entire central to outer periareolar left breast was scanned. No
lymphadenopathy seen in the left axilla.
IMPRESSION: 1. Suspicious calcifications in the slightly upper inner left breast
posterior depth.

2. Suspicious calcifications in the upper central left breast mid
depth.

RECOMMENDATION:
Stereotactic guided biopsy of the 2 groups of left breast
calcifications is recommended. The findings and recommendations have
both been discussed with the patient and her mother.

I have discussed the findings and recommendations with the patient.
Results were also provided in writing at the conclusion of the
visit. If applicable, a reminder letter will be sent to the patient
regarding the next appointment.

BI-RADS CATEGORY  4: Suspicious.

## 2019-05-01 ENCOUNTER — Ambulatory Visit: Payer: Self-pay | Admitting: Internal Medicine

## 2019-10-19 ENCOUNTER — Ambulatory Visit: Payer: Self-pay | Admitting: Nurse Practitioner
